# Patient Record
Sex: Female | Born: 1965 | Race: Black or African American | Hispanic: No | Marital: Single | Smoking: Current every day smoker
Health system: Southern US, Community
[De-identification: ages and names within clinical notes are randomized; demographics above are authoritative.]

## PROBLEM LIST (undated history)

## (undated) DIAGNOSIS — R740 Nonspecific elevation of levels of transaminase and lactic acid dehydrogenase [LDH]: Secondary | ICD-10-CM

## (undated) DIAGNOSIS — N9489 Other specified conditions associated with female genital organs and menstrual cycle: Secondary | ICD-10-CM

## (undated) DIAGNOSIS — F1010 Alcohol abuse, uncomplicated: Secondary | ICD-10-CM

## (undated) DIAGNOSIS — S82899A Other fracture of unspecified lower leg, initial encounter for closed fracture: Secondary | ICD-10-CM

## (undated) DIAGNOSIS — Z720 Tobacco use: Secondary | ICD-10-CM

## (undated) DIAGNOSIS — R7401 Elevation of levels of liver transaminase levels: Secondary | ICD-10-CM

## (undated) DIAGNOSIS — D649 Anemia, unspecified: Secondary | ICD-10-CM

## (undated) DIAGNOSIS — F1210 Cannabis abuse, uncomplicated: Secondary | ICD-10-CM

## (undated) DIAGNOSIS — J45909 Unspecified asthma, uncomplicated: Secondary | ICD-10-CM

## (undated) DIAGNOSIS — F329 Major depressive disorder, single episode, unspecified: Secondary | ICD-10-CM

## (undated) DIAGNOSIS — N920 Excessive and frequent menstruation with regular cycle: Secondary | ICD-10-CM

## (undated) DIAGNOSIS — F32A Depression, unspecified: Secondary | ICD-10-CM

## (undated) DIAGNOSIS — G43909 Migraine, unspecified, not intractable, without status migrainosus: Secondary | ICD-10-CM

## (undated) DIAGNOSIS — F1411 Cocaine abuse, in remission: Secondary | ICD-10-CM

## (undated) HISTORY — DX: Excessive and frequent menstruation with regular cycle: N92.0

## (undated) HISTORY — DX: Anemia, unspecified: D64.9

## (undated) HISTORY — DX: Cocaine abuse, in remission: F14.11

## (undated) HISTORY — PX: OTHER SURGICAL HISTORY: SHX169

## (undated) HISTORY — DX: Depression, unspecified: F32.A

## (undated) HISTORY — DX: Alcohol abuse, uncomplicated: F10.10

## (undated) HISTORY — DX: Elevation of levels of liver transaminase levels: R74.01

## (undated) HISTORY — DX: Other specified conditions associated with female genital organs and menstrual cycle: N94.89

## (undated) HISTORY — DX: Cannabis abuse, uncomplicated: F12.10

## (undated) HISTORY — DX: Nonspecific elevation of levels of transaminase and lactic acid dehydrogenase (ldh): R74.0

## (undated) HISTORY — DX: Major depressive disorder, single episode, unspecified: F32.9

## (undated) HISTORY — DX: Tobacco use: Z72.0

## (undated) HISTORY — DX: Unspecified asthma, uncomplicated: J45.909

## (undated) HISTORY — DX: Other fracture of unspecified lower leg, initial encounter for closed fracture: S82.899A

---

## 1997-11-28 ENCOUNTER — Inpatient Hospital Stay (HOSPITAL_COMMUNITY): Admission: AD | Admit: 1997-11-28 | Discharge: 1997-11-28 | Payer: Self-pay | Admitting: *Deleted

## 1997-11-30 ENCOUNTER — Other Ambulatory Visit: Admission: RE | Admit: 1997-11-30 | Discharge: 1997-11-30 | Payer: Self-pay | Admitting: Obstetrics

## 1997-11-30 ENCOUNTER — Encounter: Admission: RE | Admit: 1997-11-30 | Discharge: 1997-11-30 | Payer: Self-pay | Admitting: Internal Medicine

## 1998-03-03 ENCOUNTER — Inpatient Hospital Stay (HOSPITAL_COMMUNITY): Admission: AD | Admit: 1998-03-03 | Discharge: 1998-03-03 | Payer: Self-pay | Admitting: Obstetrics

## 1999-02-04 ENCOUNTER — Encounter: Payer: Self-pay | Admitting: Obstetrics & Gynecology

## 1999-02-04 ENCOUNTER — Inpatient Hospital Stay (HOSPITAL_COMMUNITY): Admission: AD | Admit: 1999-02-04 | Discharge: 1999-02-11 | Payer: Self-pay | Admitting: Obstetrics & Gynecology

## 2001-02-09 ENCOUNTER — Inpatient Hospital Stay (HOSPITAL_COMMUNITY): Admission: AD | Admit: 2001-02-09 | Discharge: 2001-02-09 | Payer: Self-pay | Admitting: *Deleted

## 2002-03-06 ENCOUNTER — Emergency Department (HOSPITAL_COMMUNITY): Admission: EM | Admit: 2002-03-06 | Discharge: 2002-03-06 | Payer: Self-pay | Admitting: Emergency Medicine

## 2002-03-09 ENCOUNTER — Emergency Department (HOSPITAL_COMMUNITY): Admission: EM | Admit: 2002-03-09 | Discharge: 2002-03-09 | Payer: Self-pay | Admitting: Emergency Medicine

## 2002-03-13 ENCOUNTER — Emergency Department (HOSPITAL_COMMUNITY): Admission: EM | Admit: 2002-03-13 | Discharge: 2002-03-13 | Payer: Self-pay

## 2002-03-20 ENCOUNTER — Emergency Department (HOSPITAL_COMMUNITY): Admission: EM | Admit: 2002-03-20 | Discharge: 2002-03-20 | Payer: Self-pay | Admitting: Emergency Medicine

## 2002-04-17 ENCOUNTER — Emergency Department (HOSPITAL_COMMUNITY): Admission: EM | Admit: 2002-04-17 | Discharge: 2002-04-18 | Payer: Self-pay | Admitting: Emergency Medicine

## 2002-09-18 ENCOUNTER — Inpatient Hospital Stay (HOSPITAL_COMMUNITY): Admission: AD | Admit: 2002-09-18 | Discharge: 2002-09-18 | Payer: Self-pay | Admitting: Family Medicine

## 2004-05-26 DIAGNOSIS — N9489 Other specified conditions associated with female genital organs and menstrual cycle: Secondary | ICD-10-CM

## 2004-05-26 DIAGNOSIS — N920 Excessive and frequent menstruation with regular cycle: Secondary | ICD-10-CM

## 2004-05-26 HISTORY — DX: Other specified conditions associated with female genital organs and menstrual cycle: N94.89

## 2004-05-26 HISTORY — DX: Excessive and frequent menstruation with regular cycle: N92.0

## 2004-06-16 ENCOUNTER — Inpatient Hospital Stay (HOSPITAL_COMMUNITY): Admission: AD | Admit: 2004-06-16 | Discharge: 2004-06-16 | Payer: Self-pay | Admitting: *Deleted

## 2004-06-18 ENCOUNTER — Ambulatory Visit: Payer: Self-pay | Admitting: Obstetrics & Gynecology

## 2004-06-18 ENCOUNTER — Encounter (INDEPENDENT_AMBULATORY_CARE_PROVIDER_SITE_OTHER): Payer: Self-pay | Admitting: *Deleted

## 2004-06-18 ENCOUNTER — Other Ambulatory Visit: Admission: RE | Admit: 2004-06-18 | Discharge: 2004-06-18 | Payer: Self-pay | Admitting: Obstetrics & Gynecology

## 2004-07-02 ENCOUNTER — Ambulatory Visit: Payer: Self-pay | Admitting: Obstetrics and Gynecology

## 2004-12-26 ENCOUNTER — Emergency Department (HOSPITAL_COMMUNITY): Admission: EM | Admit: 2004-12-26 | Discharge: 2004-12-26 | Payer: Self-pay | Admitting: Emergency Medicine

## 2005-02-08 ENCOUNTER — Emergency Department (HOSPITAL_COMMUNITY): Admission: EM | Admit: 2005-02-08 | Discharge: 2005-02-08 | Payer: Self-pay | Admitting: Emergency Medicine

## 2005-02-12 ENCOUNTER — Emergency Department (HOSPITAL_COMMUNITY): Admission: EM | Admit: 2005-02-12 | Discharge: 2005-02-12 | Payer: Self-pay | Admitting: Emergency Medicine

## 2005-11-19 ENCOUNTER — Emergency Department (HOSPITAL_COMMUNITY): Admission: EM | Admit: 2005-11-19 | Discharge: 2005-11-19 | Payer: Self-pay | Admitting: Emergency Medicine

## 2005-11-20 ENCOUNTER — Ambulatory Visit (HOSPITAL_COMMUNITY): Admission: RE | Admit: 2005-11-20 | Discharge: 2005-11-20 | Payer: Self-pay | Admitting: Orthopaedic Surgery

## 2006-12-15 ENCOUNTER — Emergency Department (HOSPITAL_COMMUNITY): Admission: EM | Admit: 2006-12-15 | Discharge: 2006-12-15 | Payer: Self-pay | Admitting: Emergency Medicine

## 2006-12-30 ENCOUNTER — Emergency Department (HOSPITAL_COMMUNITY): Admission: EM | Admit: 2006-12-30 | Discharge: 2006-12-31 | Payer: Self-pay | Admitting: Emergency Medicine

## 2007-01-02 ENCOUNTER — Inpatient Hospital Stay (HOSPITAL_COMMUNITY): Admission: AD | Admit: 2007-01-02 | Discharge: 2007-01-02 | Payer: Self-pay | Admitting: Obstetrics & Gynecology

## 2007-08-21 ENCOUNTER — Emergency Department (HOSPITAL_COMMUNITY): Admission: EM | Admit: 2007-08-21 | Discharge: 2007-08-21 | Payer: Self-pay | Admitting: Emergency Medicine

## 2007-11-29 ENCOUNTER — Other Ambulatory Visit: Payer: Self-pay | Admitting: Family Medicine

## 2007-11-30 ENCOUNTER — Inpatient Hospital Stay (HOSPITAL_COMMUNITY): Admission: EM | Admit: 2007-11-30 | Discharge: 2007-12-06 | Payer: Self-pay | Admitting: Emergency Medicine

## 2007-12-06 ENCOUNTER — Inpatient Hospital Stay (HOSPITAL_COMMUNITY): Admission: RE | Admit: 2007-12-06 | Discharge: 2007-12-10 | Payer: Self-pay | Admitting: *Deleted

## 2007-12-06 ENCOUNTER — Ambulatory Visit: Payer: Self-pay | Admitting: *Deleted

## 2007-12-23 ENCOUNTER — Emergency Department (HOSPITAL_COMMUNITY): Admission: EM | Admit: 2007-12-23 | Discharge: 2007-12-23 | Payer: Self-pay | Admitting: Emergency Medicine

## 2007-12-29 ENCOUNTER — Emergency Department (HOSPITAL_COMMUNITY): Admission: EM | Admit: 2007-12-29 | Discharge: 2007-12-30 | Payer: Self-pay | Admitting: Emergency Medicine

## 2007-12-30 ENCOUNTER — Emergency Department (HOSPITAL_COMMUNITY): Admission: EM | Admit: 2007-12-30 | Discharge: 2007-12-30 | Payer: Self-pay | Admitting: Emergency Medicine

## 2008-01-07 ENCOUNTER — Inpatient Hospital Stay (HOSPITAL_COMMUNITY): Admission: AD | Admit: 2008-01-07 | Discharge: 2008-01-07 | Payer: Self-pay | Admitting: Obstetrics & Gynecology

## 2008-01-08 ENCOUNTER — Inpatient Hospital Stay (HOSPITAL_COMMUNITY): Admission: AD | Admit: 2008-01-08 | Discharge: 2008-01-08 | Payer: Self-pay | Admitting: Family Medicine

## 2008-01-14 ENCOUNTER — Emergency Department (HOSPITAL_COMMUNITY): Admission: EM | Admit: 2008-01-14 | Discharge: 2008-01-14 | Payer: Self-pay | Admitting: Emergency Medicine

## 2008-01-19 ENCOUNTER — Encounter: Admission: RE | Admit: 2008-01-19 | Discharge: 2008-01-19 | Payer: Self-pay | Admitting: Family Medicine

## 2008-04-03 ENCOUNTER — Emergency Department (HOSPITAL_COMMUNITY): Admission: EM | Admit: 2008-04-03 | Discharge: 2008-04-03 | Payer: Self-pay | Admitting: Emergency Medicine

## 2008-11-04 ENCOUNTER — Emergency Department (HOSPITAL_COMMUNITY): Admission: EM | Admit: 2008-11-04 | Discharge: 2008-11-04 | Payer: Self-pay | Admitting: Family Medicine

## 2008-11-28 ENCOUNTER — Ambulatory Visit: Payer: Self-pay | Admitting: Internal Medicine

## 2008-11-28 ENCOUNTER — Encounter: Payer: Self-pay | Admitting: Internal Medicine

## 2008-11-28 DIAGNOSIS — J45909 Unspecified asthma, uncomplicated: Secondary | ICD-10-CM | POA: Insufficient documentation

## 2008-11-28 DIAGNOSIS — F329 Major depressive disorder, single episode, unspecified: Secondary | ICD-10-CM | POA: Insufficient documentation

## 2008-11-28 DIAGNOSIS — N83209 Unspecified ovarian cyst, unspecified side: Secondary | ICD-10-CM | POA: Insufficient documentation

## 2008-11-28 DIAGNOSIS — Z8639 Personal history of other endocrine, nutritional and metabolic disease: Secondary | ICD-10-CM

## 2008-11-28 DIAGNOSIS — Z862 Personal history of diseases of the blood and blood-forming organs and certain disorders involving the immune mechanism: Secondary | ICD-10-CM | POA: Insufficient documentation

## 2008-11-28 DIAGNOSIS — F17200 Nicotine dependence, unspecified, uncomplicated: Secondary | ICD-10-CM | POA: Insufficient documentation

## 2008-11-28 DIAGNOSIS — R32 Unspecified urinary incontinence: Secondary | ICD-10-CM | POA: Insufficient documentation

## 2008-11-28 LAB — CONVERTED CEMR LAB
Bilirubin Urine: NEGATIVE
Hgb A1c MFr Bld: 5.6 %
Specific Gravity, Urine: >=1.03

## 2009-01-05 ENCOUNTER — Ambulatory Visit: Payer: Self-pay | Admitting: Internal Medicine

## 2009-01-07 DIAGNOSIS — D72829 Elevated white blood cell count, unspecified: Secondary | ICD-10-CM | POA: Insufficient documentation

## 2009-01-07 LAB — CONVERTED CEMR LAB
Basophils Relative: 0 % (ref 0–1)
Bilirubin Urine: NEGATIVE
Eosinophils Absolute: 0.2 10*3/uL (ref 0.0–0.7)
HCT: 37.6 % (ref 36.0–46.0)
Hemoglobin: 12.2 g/dL (ref 12.0–15.0)
MCHC: 32.4 g/dL (ref 30.0–36.0)
MCV: 87.9 fL (ref >=78.0)
Neutrophils Relative %: 63 % (ref 43–77)
Nitrite: NEGATIVE
Platelets: 311 10*3/uL (ref 150–400)
Protein, ur: NEGATIVE mg/dL
Urine Glucose: NEGATIVE mg/dL
WBC, UA: NONE SEEN cells/hpf (ref <3)

## 2009-01-08 ENCOUNTER — Encounter: Payer: Self-pay | Admitting: Internal Medicine

## 2009-01-10 ENCOUNTER — Ambulatory Visit: Payer: Self-pay | Admitting: Internal Medicine

## 2009-01-10 LAB — CONVERTED CEMR LAB: Sed Rate: 5 mm/hr (ref 0–22)

## 2009-01-19 ENCOUNTER — Ambulatory Visit (HOSPITAL_COMMUNITY): Admission: RE | Admit: 2009-01-19 | Discharge: 2009-01-19 | Payer: Self-pay | Admitting: Internal Medicine

## 2009-03-14 ENCOUNTER — Telehealth: Payer: Self-pay | Admitting: *Deleted

## 2009-07-25 ENCOUNTER — Telehealth: Payer: Self-pay | Admitting: Internal Medicine

## 2010-01-30 ENCOUNTER — Ambulatory Visit (HOSPITAL_COMMUNITY): Admission: RE | Admit: 2010-01-30 | Discharge: 2010-01-30 | Payer: Self-pay | Admitting: Internal Medicine

## 2010-06-23 LAB — CONVERTED CEMR LAB
AST: 15 units/L (ref 0–37)
Albumin: 4.1 g/dL (ref 3.5–5.2)
Alkaline Phosphatase: 92 units/L (ref 39–117)
Basophils Absolute: 0 10*3/uL (ref 0.0–0.1)
Basophils Relative: 0 % (ref 0–1)
Bilirubin Urine: NEGATIVE
Chloride: 108 meq/L (ref 96–112)
Cholesterol: 154 mg/dL (ref 0–200)
Eosinophils Relative: 1 % (ref 0–5)
HCV Ab: NEGATIVE
HDL: 51 mg/dL (ref >39)
Hemoglobin: 12.4 g/dL (ref 12.0–15.0)
Ketones, ur: NEGATIVE mg/dL
MCHC: 32.1 g/dL (ref 30.0–36.0)
Neutro Abs: 9 10*3/uL — ABNORMAL HIGH (ref 1.7–7.7)
Neutrophils Relative %: 65 % (ref 43–77)
Nitrite: NEGATIVE
Platelets: 239 10*3/uL (ref 150–400)
RBC: 4.4 M/uL (ref 3.87–5.11)
RDW: 17.6 % — ABNORMAL HIGH (ref 11.5–15.5)
Specific Gravity, Urine: 1.03 (ref 1.005–1.030)
Triglycerides: 137 mg/dL (ref <150)

## 2010-06-25 NOTE — Progress Notes (Signed)
Summary: refill/ hla  Phone Note Refill Request Message from:  Fax from Pharmacy on July 25, 2009 10:48 AM  Refills Requested: Medication #1:  VENTOLIN HFA 108 (90 BASE) MCG/ACT AERS 2 puffs every 4 -6 hours as needed for shortness of breath.   Dosage confirmed as above?Dosage Confirmed   Last Refilled: 12/21 last seen aug/ 2010  Initial call taken by: Marin Roberts RN,  July 25, 2009 10:49 AM  Follow-up for Phone Call       Follow-up by: [name] MD,  July 26, 2009 2:21 AM    Prescriptions: VENTOLIN HFA 108 (90 BASE) MCG/ACT AERS (ALBUTEROL SULFATE) 2 puffs every 4 -6 hours as needed for shortness of breath  #1 x 6   Entered and Authorized by:   [name] MD   Signed by:   [name] MD on 07/26/2009   Method used:   Faxed to ...       [facility] (retail)       [address] [place], [place]  [address]       Ph: [phone]       Fax: [phone]   RxID:   [phone] [phone]

## 2010-08-04 ENCOUNTER — Encounter: Payer: Self-pay | Admitting: Ophthalmology

## 2010-09-02 LAB — POCT URINALYSIS DIP (DEVICE)
Glucose, UA: NEGATIVE mg/dL
Hgb urine dipstick: NEGATIVE
Nitrite: NEGATIVE
Urobilinogen, UA: 1 mg/dL (ref 0.0–1.0)

## 2010-10-08 NOTE — Consult Note (Signed)
[name], [name]                 ACCOUNT NO.:  [id number]   MEDICAL RECORD NO.:  [id number]          PATIENT TYPE:  INP   LOCATION:  1501                         FACILITY:  [facility]   PHYSICIAN:  [name], M.D. DATE OF BIRTH:  1966/04/30   DATE OF CONSULTATION:  12/05/2007  DATE OF DISCHARGE:  12/06/2007                                 CONSULTATION   HISTORY OF PRESENT ILLNESS:  I was call to see [name] who has already  been scheduled for hospitalization at [facility], apparently they needed a more recent evaluation by a  psychiatrist before she could be transferred.  She was already seen by  Dr. Horton Marshall (please see his full note).  The patient is a 45 year old  African American female who is admitted to the hospital after taking an  overdose of Motrin 50 tablets with suicidal attempt.  She states that  her sleep is still poor, her appetite is poor.  Her partner, [name], was  with [place] as we talked and gave a lot of information too.  She states she  has been several years depressed and anxious, she has never been treated  at all and she has a serious alcohol abuse problem, marijuana and  cocaine, and other drugs except heroin.  She states she was living in a  bad environment and it was impossible to stop her use.  She continues to  have positive suicidal ideation if she goes home, she does not feel she  would be safe.  She did state she called her partner after the attempt,  their number stretches including partner being pregnant and finances.   MENTAL STATUS EXAM:  The patient was a friendly, cooperative, thin  female who was lying in bed.  Speech was soft and slow.  Psychomotor  activity revealed retardation.  Eye contact was good.  Mood was  depressed.  Anxious affect consistent with mood.  Suicidal ideation was  positive if she goes home and notes no homicidal ideation.  No auditory  or visual hallucinations.  There is no paranoid delusions.   Thoughts  were logical and goal directed.  Thought content, not predominant.  The  cognitive was grossly intact.   ASSESSMENT:  Mood disorder, not otherwise specified and polysubstance  dependence.  The patient is still suicidal, if she goes home.   RECOMMENDATIONS:  Transfer to [facility] as  soon as patient is medically stable.  She agrees to this transfer.      [name], M.D.  Electronically Signed     BHS/MEDQ  D:  12/05/2007  T:  12/05/2007  Job:  [id number]

## 2010-10-08 NOTE — H&P (Signed)
NAMEKYLIANA, STANDEN                 ACCOUNT NO.:  [id number]   MEDICAL RECORD NO.:  [id number]          PATIENT TYPE:  INP   LOCATION:  0101                         FACILITY:  [facility]   PHYSICIAN:  Lucita Ferrara, MD         DATE OF BIRTH:  12-May-1966   DATE OF ADMISSION:  11/30/2007  DATE OF DISCHARGE:                              HISTORY & PHYSICAL   The patient is a 45 year old African American female presents to [facility] transferred here from [facility].  The patient  presented there with a chief complaint of abdominal pain located  bilateral lower abdomen with no radiation that apparently has been going  on now for the last 3 days.  The patient states that she took 50 tablets  of ibuprofen in the last 3 days.  Note, she does  not know if she took  acetaminophen or ibuprofen.  Her pain is been associated with fevers,  chills, nausea, and vomiting.  Vomitus is nonbilious.  She denies any  diarrhea, dysuria or vaginal discharge.  Also her history is significant  for polysubstance abuse with cocaine and marijuana which she has used in  the last 2 or 3 days.  She denies any hematemesis, hematochezia.  She  denies any coffee-ground emesis.  She denies any recent sick contacts.  No recent travel.  She also admits to drinking unknown amount of  alcohol.   PAST MEDICAL HISTORY:  Is significant for:  1. History of polysubstance abuse.  2. Reports remote history of clinical vaginal surgery.  3. History of asthma.   FAMILY HISTORY:  Unknown, noncontributory at this point.   SOCIAL HISTORY:  She smokes.  She is a heavy drinker.  She also abuses  crack cocaine and cannabis which she has used in the last 24-48 hours.   ALLERGIES:  No known drug allergies.   MEDICATIONS:  Include:  1. Ibuprofen.  2. Albuterol.  3. Unknown whether she has taken Tylenol.   PHYSICAL EXAMINATION:  Generally speaking, the patient is in no acute  distress.  She is obviously intoxicated.  Temperature is T max is 101.1.  HEENT:  Normocephalic, atraumatic.  Sclerae anicteric.  NECK:  Supple, no JVD or carotid bruits.  PERRLA.  Extraocular muscles intact.  CARDIOVASCULAR:  S1, S2 regular rhythm, no murmurs, rubs or clicks.  ABDOMEN:  Soft, nontender, nondistended.  Positive bowel sounds.  LUNGS:  Clear to auscultation bilaterally.  No rhonchi, rales or wheeze.  LOWER EXTREMITIES:  No clubbing, cyanosis or edema.  NEURO:  Patient is intoxicated, yet alert and oriented x3.  Cranial  nerves II through XII are grossly intact.   LABORATORY DATA:  Lipase 24, blood alcohol level less than 5,  acetaminophen level less than 10.  Urine drug screen positive for  benzodiazepines, positive for cocaine.  Complete metabolic panel within  normal limits with the exception of a low potassium of 3.2. Alk phos  138, AST 6713, ALT 5230.  Urinalysis, high specific gravity of 1.03.  Urine pregnancy test negative.  Radiological data:  Chest x-ray shows  no  acute cardiopulmonary events.   ASSESSMENT/PLAN:  A 45 year old with:  1. Abdominal pain for which she has ingested 50 ibuprofens per her who      comes here with severely high AST, ALT, acute hepatitis.  Rule out      hepatitis A,B,C.  We will send off hepatitis A, B, C.  Tylenol      toxicity ruled out in the emergency room, unlikely  pancreatitis.      We will proceed with an abdominal ultrasound and a CT scan of the      abdomen and pelvis with and without contrast  2. Polysubstance abuse with alcohol, cocaine and THC.  Again, unclear      when the last alcohol ingestion was as patient is intoxicated now.      The patient is likely to go into withdrawal; however, alcohol      withdrawal protocol is advised, although, given her acute hepatitis      I do not want to exacerbate that with any benzodiazepines at this      point.  Again, we will go ahead and watch for any signs of      withdrawal.  Ativan only p.r.n.  3. Hypokalemia.   Replete.  4. Parasuicide.  It is unclear whether the patient took ibuprofen for      abdominal pain or whether she was trying to hurt herself.  There      was one expression here in the emergency room that she was trying      to hurt herself, although she is intoxicated.  Regardless, we will      go ahead and proceed with one-on-one observation.  Will consider an      ACT team- evaluation prior to any psychiatric evaluation.   PLAN:  Hepatitis panel, (A, B, C) note tylenol toxicity ruled out.  CT  scan of abdomen and pelvis, abdominal ultrasound to rule-out  intrahepatic dysfunction.  detox protocol.  Replete potassium.  Protonix  b.i.d.  Note:  It is worrisome if she took ibuprofen.  We need to watch  her hemoglobin to make sure she does not have any peptic ulcer disease.  The rest of the plans are dependent on her progress and hospital course.      Lucita Ferrara, MD  Electronically Signed     RR/MEDQ  D:  11/30/2007  T:  11/30/2007  Job:  [id number]

## 2010-10-08 NOTE — Discharge Summary (Signed)
[name], [name]                 ACCOUNT NO.:  [id number]   MEDICAL RECORD NO.:  [id number]          PATIENT TYPE:  IPS   LOCATION:  0603                          [facility]  [facility]   PHYSICIAN:  Jasmine Pang, M.D. DATE OF BIRTH:  1966-02-25   DATE OF ADMISSION:  12/06/2007  DATE OF DISCHARGE:  12/10/2007                               DISCHARGE SUMMARY   IDENTIFICATION:  This is a 45 year old single African American female  who was admitted on a voluntary basis on December 06, 2007.   HISTORY OF PRESENT ILLNESS:  The patient presents with a history of  intentional overdose on ibuprofen taking about 50 tablets.  The patient  was initially admitted to the regular hospital and had approximately a  week stay until medically stable for transfer to our facility.  Her  intention with the overdose was as a suicide attempt.  She reports being  depressed for years, but her depression has been increasing over the  past few weeks.  She has been using alcohol and cocaine regularly,  drinking a lot.  Her sleep has been decreased.  She states that she  smokes crack cocaine all the time.  She is currently looking for a long-  term rehab.  She did have a history of sobriety that lasted 2 years.  She denies any hallucinations at this time.   PAST PSYCHIATRIC HISTORY:  This is the patient's first admission to  [facility] and no other psychiatric hospitalizations  known.  No current outpatient mental health treatment.   FAMILY HISTORY:  Noncontributory.   ALCOHOL AND DRUG HISTORY:  As above.  Denies any IV drug use.  Denies  any seizures or blackouts.  Last drink was over a week ago.   MEDICAL PROBLEMS:  Asthma.   MEDICATIONS:  The patient takes an occasional albuterol inhaler.  No  other medications listed.   DRUG ALLERGIES:  No known drug allergies.   PHYSICAL FINDINGS:  The patient had no acute physical or medical  problems noted.  Her physical exam was done during the inpatient  stay on  the medical unit.   ADMISSION LABORATORIES:  Urine drug screen was positive for cocaine,  positive for THC.  CBC was within normal limits.  Urinalysis was  negative.  Hepatitis panel was negative, but liver enzymes were quite  elevated on admission with an AST elevated at 6713 and an ALT of 5230.  Liver enzymes continued to decline with her stay.  On December 04, 2007, her  ALT is 572 with an AST of 64.  Her alcohol level was less than 5 upon  admission.   HOSPITAL COURSE:  Upon admission, the patient was started on trazodone  50 mg p.o. q.h.s., and Septra DS one p.o. b.i.d. x3 days for UTI.  This  was recommended by the internal medicine physician.  In individual  sessions with me, the patient was reserved, but cooperative with fair  eye contact.  She continued to state she wanted long-term treatment.  On  December 07, 2007, she was started on Symmetrel 100 mg b.i.d. and  Celexa 20  mg q.a.m.  The patient did not participate appropriately in unit  therapeutic groups and activities and spent most of her time in bed.  On  December 08, 2007, trazodone was increased to 100 mg q.h.s. may repeat x1.  The patient focused on having some GI discomfort and continued to stay  in bed.  She also remained depressed and anxious.  On December 09, 2007, she  complained of yeast infection.  She was started on Diflucan 150 mg q.  day.  She was starting to become less depressed and less anxious.  She  had a lot of support from her family and her partner.  There was a  family session with her mother and partner and the patient felt good  about their involvement.  On December 10, 2007, mental status had improved  markedly from admission status.  The patient's mood was less depressed,  less anxious.  Affect consistent with mood.  There was no suicidal or  homicidal ideation.  No thoughts of self-injurious behavior.  No  auditory or visual hallucinations.  No paranoia or delusions.  Thoughts  were logical and  goal-directed.  Thought content, no predominant theme.  Cognitive was grossly intact.  It was felt the patient was safe for  discharge today.   DISCHARGE DIAGNOSES:  Axis I:  Alcohol dependence.  Polysubstance abuse.  Depressive disorder, not otherwise specified.  Axis II:  None.  Axis III:  1.  Acute transaminitis.  1. Normocytic anemia.  2. Menorrhagia.  3. Urinary tract infection.  Axis IV:  Moderate (other psychosocial problems related to chronic  substance use).  Axis V:  Global assessment of functioning was 53 upon discharge.  GAF  was 40-45 upon admission.  GAF highest past year was 60.   DISCHARGE PLANS:  There was no specific activity level or dietary  restriction.   POSTHOSPITAL CARE PLANS:  The patient will be [facility] at [facility]  on Monday December 13, 2007, at 11:30 a.m.  She will also be seen at the  [facility] on Tuesday December 24, 2007, at 8 o'clock.   DISCHARGE MEDICATIONS:  1. Amantadine 100 mg twice daily.  2. Celexa 20 mg daily.  3. Trazodone 100 mg at bedtime if needed.      Jasmine Pang, M.D.  Electronically Signed     BHS/MEDQ  D:  12/11/2007  T:  12/11/2007  Job:  [id number]

## 2010-10-08 NOTE — Discharge Summary (Signed)
NAMEGWENEVERE, [name]                 ACCOUNT NO.:  [id number]   MEDICAL RECORD NO.:  [id number]          PATIENT TYPE:  INP   LOCATION:  1518                         FACILITY:  [facility]   PHYSICIAN:  Lonia Blood, M.D.DATE OF BIRTH:  [birth year]   DATE OF ADMISSION:  11/30/2007  DATE OF DISCHARGE:  12/04/2007                               DISCHARGE SUMMARY   PRIMARY CARE PHYSICIAN:  Unassigned.   DISCHARGE DIAGNOSES:  1. Acute transaminitis.      a.     Secondary to alcohol abuse plus ibuprofen abuse.      b.     Resolving without further acute intervention.      c.     No evidence of Tylenol overdose.  2. Multifactorial normocytic anemia.      a.     Alcohol abuse.      b.     Poor nutrition.      c.     Menorrhagia.  3. Suicidal ideation - for transfer to psychiatric facility.  4. Polysubstance abuse.      a.     Tobacco.      b.     Alcohol.      c.     Crack cocaine.      d.     Marijuana  5. Urinary tract infection - antibiotic therapy initiated.  6. Adnexal cystic masses.      a.     First noted in 2006.      b.     For outpatient observation.      c.     The patient states that they have been fully evaluated and       that she has been told there is no indication for excision.   DISCHARGE MEDICATIONS:  Septra DS, 1 p.o. b.i.d. x7 days then stop.   CONSULTATIONS:  Inpatient psychiatry.   PROCEDURE:  1. Ultrasound of the abdomen November 30, 2007 - unremarkable abdominal      ultrasound.  2. CT scan of the abdomen and pelvis November 30, 2007 - no acute findings      in the abdomen.  No imaging findings to suggest cirrhosis.  Chronic      cystic bilateral adnexal masses, right larger than left.  Given the      similarities to those documented in 2006, ovarian torsion is      unlikely. These could reflect endometriomas, recurrent hemorrhagic      cysts, or benign ovarian neoplasms.  Follow-up ultrasound is      recommended in 6-10 weeks.  No acute findings in her pelvis.   DISCHARGE LABORATORY DATA:  At the time of this dictation, AST is 130  and ALT is 957, which were both significantly improved from the day  prior, at which time AST was 235 and ALT was 1296.  Hemoglobin is 9.9 at  discharge which is felt to be reflective of the patient's baseline.   HOSPITAL COURSE:  [name] is a 45 year old female, who presented  to the hospital on November 30, 2007 with complaints of  abdominal pain.  During her stay in the emergency room, she voiced a desire to commit  suicide and alluded to the idea that she had perhaps done this prior to  her admission.  With further prompting she reported that she had taken a  total of 50 ibuprofen, some time over the last 72 hours.  She also  admitted to abuse of cocaine, marijuana and alcohol.  She was admitted  to the acute unit after she was found to be suffering with severe  elevation of LFTs.  AST was 6713 with an ALT of 5230 at the time of her  admission.  Acetaminophen level was obtained and was found to be  unremarkable.  The likely cause was felt to be alcohol abuse, as well as  ibuprofen which can infrequently cause hepatitis.  A viral hepatitis was  obtained and was unrevealing.  The patient was hydrated.  Her LFTs were  followed closely.  With ongoing hydration and avoidance of hepatotoxic  medications, the patient's LFTs improved very nicely.  At the time of  this dictation, her AST is 130 and ALT is 957.  She has no jaundice or  scleral icterus and there is no difficulty with oral intake.   During the hospital stay, significant normocytic anemia was also  appreciated.  An iron panel was obtained which revealed a low iron, a  low percent sat and a normal total iron binding capacity.  B12 folate  levels were normal.  The patient did report having monthly 7-day periods  which were marked by heavy flow.  Additionally she admits to chronic  alcohol abuse.  The patient's anemia is felt to be a chronic  multifactorial  anemia, due to the direct toxic effects of alcohol on the  bone marrow, as well as increased loss from menorrhagia.  At the present  time, no further evaluation is felt to be indicated.  The patient has  been counseled extensively as to the need to discontinue her  polysubstance abuse and has been advised to obtain a GYN for close  followup in the outpatient setting.   During this hospital stay, urinalysis was worrisome for a probable  urinary tract infection.  The patient had a low grade leukocytosis.  Septra was initiated during the hospital stay and the patient tolerated  this without difficulty.  She will complete a full 7-day course at the  time of her discharge.   Given the patient's mention of probable suicidal ideation, inpatient  psychiatry was consulted.  They did, in fact, feel that the patient was  at risk for harming herself should she be discharged home.  They  recommended that the patient be transferred directly to a psychiatric  ward.  At the present time, the patient has been medically cleared.  We  are awaiting bed availability in the psychiatric facility, so that the  patient can be discharged for ongoing care.   As part of evaluation for the patient's abdominal pain, the CT scan of  the abdomen and pelvis was carried out.  This did reveal bilateral  cystic adnexal masses.  They were noted to be present on previous  imaging studies dating back to 2006  92,006.  These findings were  discussed with the patient.  She reports that she was followed closely  with GYN years ago for this problem.  She was advised that these were  harmless cysts and that they did not need to be removed until they  started causing problems for  her.  They do, in fact, appear to be  stable in size.  It is recommended that reevaluation be accomplished in  6-10 weeks for routine following, and the patient is advised to revisit  her GYN at such time that she released from the psychiatric  facility.   On December 04, 2007, [name] was cleared for discharge from a medical  standpoint.  At such time that a psychiatric facility that can accept  her is identified, the patient will be transferred directly there.      Lonia Blood, M.D.  Electronically Signed     JTM/MEDQ  D:  12/04/2007  T:  12/04/2007  Job:  [id number]

## 2010-10-08 NOTE — Discharge Summary (Signed)
NAMEDANILYN, COCKE                 ACCOUNT NO.:  [id number]   MEDICAL RECORD NO.:  [id number]          PATIENT TYPE:  INP   LOCATION:  1501                         FACILITY:  [facility]   PHYSICIAN:  Lonia Blood, M.D.      DATE OF BIRTH:  12-Dec-1965   DATE OF ADMISSION:  11/30/2007  DATE OF DISCHARGE:  12/06/2007                               DISCHARGE SUMMARY   Please, for full discharge diagnosis, see the discharge summary see  discharge summary dictated by Dr. Jetty Duhamel on December 04, 2007.  Between July, 2009 and December 06, 2007 the patient was awaiting transfer  to [facility].  She has done well and psychiatric has seen  patient.  She became depressed and hopeless on December 05, 2007.  Based on  their recommendation, the patient was transferred to the [facility] on December 06, 2007.  Otherwise, the rest of  discharge summary is unchanged from December 04, 2007.      Lonia Blood, M.D.  Electronically Signed     LG/MEDQ  D:  01/02/2008  T:  01/02/2008  Job:  [id number]

## 2010-10-08 NOTE — H&P (Signed)
[name], [name]                 ACCOUNT NO.:  [id number]   MEDICAL RECORD NO.:  [id number]          PATIENT TYPE:  IPS   LOCATION:  0603                          FACILITY:  [facility]   PHYSICIAN:  [name], M.D. DATE OF BIRTH:  [birth year]   DATE OF ADMISSION:  12/06/2007  DATE OF DISCHARGE:                       PSYCHIATRIC ADMISSION ASSESSMENT   This is a 45 year old female voluntarily admitted on December 06, 2007.   HISTORY OF PRESENT ILLNESS:  The patient presents with a history of  intentional overdose on ibuprofen, taking about 50 tablets.  The patient  was initially admitted to the hospital and had approximately a week's  stay until medically stable for transfer to our facility.  Her intention  with the overdose was as a suicide attempt.  She reports being depressed  for years but her depression has been increasing over the past few  weeks.  She has been using alcohol and cocaine regularly, drinking a  lot.  Her sleep has been decreased.  She states that she smokes crack  all the time.  She is currently living for long-term rehab.  She did  have a history of sobriety that lasted 2 years.  Denies any  hallucinations at this time.   PAST PSYCHIATRIC HISTORY:  This is the patient's first admission to  [facility].  No other psychiatric hospitalizations known.  No current outpatient mental health treatment.   SOCIAL HISTORY:  This is a 45 year old female who has a supportive  partner who is currently 4 months pregnant.  The patient lives in an  extended family situation.  Is unemployed.   FAMILY HISTORY:  Noncontributory.   ALCOHOL AND DRUG HISTORY:  As above.  Denies any IV drug use.  Denies  any seizures or blackouts.  Last drink was over a week ago.   MEDICAL PROBLEMS:  Asthma.   MEDICATIONS:  The patient takes an occasional albuterol inhaler.  No  other medications listed.   DRUG ALLERGIES:  No known allergies.   PHYSICAL EXAM:  This is a thin, somewhat  disheveled female.  She appears  tired but she offers no specific complaints at this time.  Her temperature is 97.7, 66 heart rate, 20 respirations, blood pressure  is 135/76.  She is 5 feet 7 inches tall and 65 kg.   Her laboratory listed as a urine drug screen positive for cocaine,  positive for THC.  CBC within normal limits.  Urinalysis is negative.  Hepatitis panel was negative.  Her liver enzymes were quite elevated on  admission with an AST elevated at 6713 and an ALT of 5230.  Liver  enzymes continued to decline over her stay and on July 11, her ALT is  572 with an AST of 64.  Her alcohol level was less than 5 on admission.   MENTAL STATUS EXAM:  This is a fully alert female, cooperative, poor eye  contact.  Again, somewhat disheveled.  She is appropriately dressed.  Speech is soft-spoken.  Offers little information but does answer  questions.  Her mood is hopeless.  Her affect is depressed.  Thought  processes are coherent.  No evidence of any delusional thinking.  Denies  any current suicidal thoughts.  Cognitive function intact.  Memory  appears to be good.  Judgment and insight appear to be fair to good.  Poor impulse control related to substance use.   AXIS I:  1.  Alcohol dependence.  2.  Polysubstance abuse.  3.  Depressive disorder, not otherwise specified.  AXIS II:  Deferred.  AXIS III:  1.  Acute transaminitis.  2.  Normocytic anemia.  3.  Menorrhagia.3.  Urinary tract infection.  AXIS IV:  Other psychosocial problems related to chronic substance use.  AXIS V:  Current is 40-45.   PLAN:  Contract for safety.  We will stabilize mood and thinking.  Will  continue to work on the patient's relapse prevention.  We will have  Symmetrel available for cocaine cravings.  Encourage fluids.  The  patient will be in the red group.  We will also continue to assess her  comorbidities.  Case manager will assess any long-term rehab programs.  Tentative length of stay is 3-5  days.      Landry Corporal, N.P.      [name], M.D.  Electronically Signed    JO/MEDQ  D:  12/07/2007  T:  12/07/2007  Job:  [id number]

## 2010-10-08 NOTE — Consult Note (Signed)
NAMEJEFFREY, VOTH                 ACCOUNT NO.:  [id number]   MEDICAL RECORD NO.:  [id number]          PATIENT TYPE:  INP   LOCATION:  1518                         FACILITY:  [facility]   PHYSICIAN:  Antonietta Breach, M.D.  DATE OF BIRTH:  08/05/1965   DATE OF CONSULTATION:  12/01/2007  DATE OF DISCHARGE:                                 CONSULTATION   REQUESTING PHYSICIAN:  [facility]   REASON FOR CONSULTATION:  Overdose.   HISTORY OF PRESENT ILLNESS:  Ms. [name] is a 45 year old female  admitted to the [facility] on November 30, 2007 due to hepatitis  and an overdose.   The patient acknowledges that she was trying to harm herself when she  took 50 tablets of Motrin she states that she relapsed on her cocaine  and felt severe depression and suicidal thoughts.  She took the Motrin  in a suicide attempt.   She does continue with suicidal thoughts. However, she is able to push  them now and they are fleeting. She is able to contract for safety.   She is not having any hallucinations or delusions.  She has no racing  thoughts.  Her orientation and memory are intact.  She is cooperative.   PAST PSYCHIATRIC HISTORY:  The the patient does have a history of heavy  alcohol use in the past.  She has had multiple relapses on cocaine.  The  longest that she has been abstinent from cocaine has been 6 months.   FAMILY PSYCHIATRIC HISTORY:  None.   SOCIAL HISTORY:  The patient is single.  Religion [employer].   PAST MEDICAL HISTORY:  Hepatitis, asthma.   The patient does have elevated SGOT at 49 on SGPT is 2743. Her Tylenol  lipase and aspirin are negative.  Urine drug screen positive for  benzodiazepines, cocaine and THC.  Urine HCG negative.  Mono test  negative.   The patient has NO KNOWN DRUG ALLERGIES.   Afebrile.  VITAL SIGNS:  Stable.   MENTAL STATUS EXAM:  [name] is alert and oriented to all spheres.  Affect is constricted with tears.  Mood is depressed.  Thought  content  as in the history of present illness.  Thought process logical,  coherent, goal-directed.  No looseness of associations.  Insight is  intact. Judgment is intact for the need of treatment.  Memory is intact.   ASSESSMENT:  AXIS I:  1.)  293.83 Mood disorder not otherwise specified, depressed.  2.)  Polysubstance dependence.  AXIS II:  Deferred.  AXIS III:  See above.  AXIS IV:  Primary support group.  AXIS V:  35.   [name] is at risk to harm herself outside of the supportive  environment of the hospital. She does contract for safety on the ward.  She agrees to call the nursing station if she has any concern that she  is going to act on suicidal thoughts.   RECOMMENDATIONS:  1. The patient would be at risk outside of the hospital; therefore,      recommend that the patient be  admitted to an inpatient psychiatric      ward for further evaluation and treatment of depression and      substance dependence.  2. Would check an INR.  3. Psychotropic medication deferred.      Antonietta Breach, M.D.  Electronically Signed     JW/MEDQ  D:  12/01/2007  T:  12/01/2007  Job:  [id number]

## 2010-10-11 NOTE — Group Therapy Note (Signed)
[name], [name]                 ACCOUNT NO.:  [id number]   MEDICAL RECORD NO.:  [id number]          PATIENT TYPE:  WOC   LOCATION:  [facility]                   FACILITY:  [facility]   PHYSICIAN:  Elsie Lincoln, MD      DATE OF BIRTH:  [birth year]   DATE OF SERVICE:  06/18/2004                                    CLINIC NOTE   CHIEF COMPLAINT:  Vaginal bleeding.   SUBJECTIVE:  Ms. Minchew is a new patient to our clinic.  Briefly, she is a G1  P0-0-1-0 with one termination who is 45 years old and has been having  menometrorrhagia for this last month.  She states in the past month she has  bled three times, approximately 3-4 days in duration, with very heavy  bleeding. This last bleeding has been going on for 3 days.  She does state  she had a normal menses at the end of December.  With these bleeds she has a  lot of cramping and nausea.  She does state she is lightheaded and dizzy.  She is not on any type of contraception but uses condoms when she is  sexually active.  She denies any history of endometriosis and has never had  abnormal bleeding in the past.   PAST MEDICAL HISTORY:  Negative.   PAST SURGICAL HISTORY:  Negative.   FAMILY HISTORY:  Negative.   SOCIAL HISTORY:  She does smoke tobacco.  She also admits to cocaine and a  history of IV drug use in the past.  She drinks alcohol and she does not  work.   REVIEW OF SYSTEMS:  Per history form.   OBJECTIVE:  VITAL SIGNS:  Noted.  GENERAL:  She is a disheveled-appearing female in no acute distress.  PELVIC:  Reveals normal external female genitalia.  There is blood on the  perineum and in the vagina.  Vaginal walls appear normal.  Cervix appears  nulliparous with no lesions.  The uterus was sounded to 8 cm.   PROCEDURE:  Endometrial biopsy was performed.  The patient was placed in  lithotomy position and a speculum was inserted.  The cervix was prepped in a  sterile manner.  Tenaculum was not used due to the fact that the  biopsy  pipette was easily inserted through the cervix to 8 cm.  One sample was  taken and the patient had too much discomfort to have that repeated.  She  does understand that she will have to have it repeated if the specimen is  not adequate.   ASSESSMENT AND PLAN:  1.  Menometrorrhagia.  I went ahead and did the endometrial biopsy.  She      will return in 2 weeks for follow-up of those results.  I did discuss      with her options for cycling and since she smokes tobacco and is over 35      I cannot give her any estrogen-containing products.  Therefore, she has      agreed to a Depo-Provera injection and will be receiving one of those      today.  I have given her ibuprofen 800 t.i.d. #90 no refills.  Her      ultrasound does show a right adnexal mass that is 7 x 5 x 6 cm this is a      possible endometrioma.  The left ovary shows a 2.5 x 5 x 4 cm mass which      may be a hemorrhagic cyst.  Again, she denies any history of      endometriosis.  2.  History of cocaine use.  I went ahead and took a drug screen today.      There is some concern for drug-seeking behavior.  3.  Ovarian masses.  Will go ahead and do a repeat ultrasound in 6 weeks for      follow-up.   She is to follow up in 2 weeks.      LC/MEDQ  D:  06/18/2004  T:  06/18/2004  Job:  [id number]

## 2010-10-11 NOTE — Op Note (Signed)
NAMEEWELINA, NAVES                 ACCOUNT NO.:  [id number]   MEDICAL RECORD NO.:  [id number]          PATIENT TYPE:  AMB   LOCATION:  SDS                          FACILITY:  [facility]   PHYSICIAN:  Sharolyn Douglas, M.D.        DATE OF BIRTH:  1966-04-20   DATE OF PROCEDURE:  11/20/2005  DATE OF DISCHARGE:  11/20/2005                                 OPERATIVE REPORT   PREOPERATIVE DIAGNOSIS:  Left bimalleolar equivalent ankle fracture.   POSTOPERATIVE DIAGNOSIS:  Left bimalleolar equivalent ankle fracture.   PROCEDURE:  Closed reduction of left ankle fracture under fluoroscopy and  placement of short-leg cast.   SURGEON:  Sharolyn Douglas, M.D.   ASSISTANT:  Colleen Mahar, P.A.-C.   ANESTHESIA:  General endotracheal.   COMPLICATIONS:  None.   INDICATIONS:  The patient is a 45 year old female who fell several days ago.  She presented to [facility] yesterday and had x-rays taken which  showed a lateral malleolus fracture with displacement of the medial mortise.  She now presents for closed reduction and casting under anesthesia with  possible ORIF.  I discussed both options with her and she understands the  risks and benefits.  She elected to proceed.   PROCEDURE:  She was identified in the holding area, taken to the operating  room, underwent general endotracheal anesthesia without difficulty, given  prophylactic IV antibiotics.  The fluoroscopy was used to evaluate the  ankle.  Using gentle manipulation, the lateral malleolus fracture was  reduced.  Repeat imaging showed near anatomic alignment of the mortise.  We,  therefore, elected to proceed with placement of a short-leg cast.  A well  padded and well molded cast was then placed using Webril and fiberglass.  Repeat imaging again demonstrated excellent reduction of the ankle mortise.  The patient was then extubated without difficulty and transferred to the  recovery room in stable condition.  She will remain non-weight bearing  with  crutches.  She will follow up in the office in one week for a repeat  radiograph.      Sharolyn Douglas, M.D.  Electronically Signed     MC/MEDQ  D:  11/20/2005  T:  11/20/2005  Job:  [id number]

## 2011-02-17 LAB — RAPID URINE DRUG SCREEN, HOSP PERFORMED
Cocaine: POSITIVE — AB
Tetrahydrocannabinol: POSITIVE — AB

## 2011-02-17 LAB — URINALYSIS, ROUTINE W REFLEX MICROSCOPIC
Bilirubin Urine: NEGATIVE
Nitrite: NEGATIVE
Specific Gravity, Urine: 1.027
Urobilinogen, UA: 0.2
pH: 5.5

## 2011-02-17 LAB — POCT CARDIAC MARKERS
CKMB, poc: <1 — ABNORMAL LOW
Operator id: 1192
Troponin i, poc: <0.05

## 2011-02-17 LAB — URINE MICROSCOPIC-ADD ON

## 2011-02-20 LAB — H1N1 SCREEN (PCR): H1N1 Virus Scrn: NOT DETECTED

## 2011-02-20 LAB — URINALYSIS, ROUTINE W REFLEX MICROSCOPIC
Glucose, UA: NEGATIVE
Glucose, UA: NEGATIVE
Ketones, ur: 15 — AB
Protein, ur: 30 — AB
Urobilinogen, UA: 1
pH: 6

## 2011-02-20 LAB — RETICULOCYTES
RBC.: 3.58 — ABNORMAL LOW
Retic Count, Absolute: 25.1

## 2011-02-20 LAB — SALICYLATE LEVEL: Salicylate Lvl: <4

## 2011-02-20 LAB — COMPREHENSIVE METABOLIC PANEL
ALT: 1802 — ABNORMAL HIGH
ALT: 957 — ABNORMAL HIGH
AST: 130 — ABNORMAL HIGH
AST: 1409 — ABNORMAL HIGH
AST: 64 — ABNORMAL HIGH
AST: 6713 — ABNORMAL HIGH
Albumin: 2.4 — ABNORMAL LOW
Albumin: 2.4 — ABNORMAL LOW
Albumin: 2.5 — ABNORMAL LOW
Albumin: 2.5 — ABNORMAL LOW
Alkaline Phosphatase: 112
Alkaline Phosphatase: 118 — ABNORMAL HIGH
Alkaline Phosphatase: 122 — ABNORMAL HIGH
Alkaline Phosphatase: 138 — ABNORMAL HIGH
Alkaline Phosphatase: 140 — ABNORMAL HIGH
BUN: 3 — ABNORMAL LOW
BUN: 3 — ABNORMAL LOW
BUN: 6
CO2: 25
CO2: 29
Calcium: 8.6
Chloride: 104
Chloride: 112
Chloride: 114 — ABNORMAL HIGH
Creatinine, Ser: 0.79
Creatinine, Ser: 0.83
Creatinine, Ser: 0.94
Creatinine, Ser: 1.04
GFR calc Af Amer: >60
GFR calc Af Amer: >60
GFR calc Af Amer: >60
GFR calc non Af Amer: 58 — ABNORMAL LOW
GFR calc non Af Amer: >60
Glucose, Bld: 101 — ABNORMAL HIGH
Potassium: 3.2 — ABNORMAL LOW
Potassium: 4
Potassium: 4.1
Potassium: 4.2
Potassium: 4.2
Sodium: 138
Sodium: 140
Total Bilirubin: 1.4 — ABNORMAL HIGH
Total Bilirubin: 1.8 — ABNORMAL HIGH
Total Protein: 4.6 — ABNORMAL LOW
Total Protein: 4.7 — ABNORMAL LOW
Total Protein: 4.8 — ABNORMAL LOW
Total Protein: 4.8 — ABNORMAL LOW

## 2011-02-20 LAB — CBC
HCT: 33.8 — ABNORMAL LOW
Hemoglobin: 11.2 — ABNORMAL LOW
MCHC: 32.9
MCHC: 33
MCHC: 33.4
MCV: 87.1
MCV: 89.3
Platelets: 152
Platelets: 162
Platelets: 194
RDW: 18.2 — ABNORMAL HIGH
RDW: 19.3 — ABNORMAL HIGH
WBC: 11.4 — ABNORMAL HIGH

## 2011-02-20 LAB — RAPID URINE DRUG SCREEN, HOSP PERFORMED
Cocaine: POSITIVE — AB
Cocaine: POSITIVE — AB
Opiates: NOT DETECTED
Tetrahydrocannabinol: POSITIVE — AB

## 2011-02-20 LAB — URINE MICROSCOPIC-ADD ON

## 2011-02-20 LAB — PROTIME-INR
INR: 1.1
INR: 1.4
Prothrombin Time: 15
Prothrombin Time: 17.5 — ABNORMAL HIGH

## 2011-02-20 LAB — DIFFERENTIAL
Basophils Absolute: 0
Basophils Relative: 0
Basophils Relative: 0
Eosinophils Absolute: 0
Eosinophils Absolute: 0
Eosinophils Relative: 0
Eosinophils Relative: 0
Monocytes Absolute: 0.2
Monocytes Absolute: 0.6
Monocytes Relative: 5

## 2011-02-20 LAB — FERRITIN: Ferritin: 62 (ref 10–291)

## 2011-02-20 LAB — IRON AND TIBC: Iron: 28 — ABNORMAL LOW

## 2011-02-20 LAB — MONONUCLEOSIS SCREEN: Mono Screen: NEGATIVE

## 2011-02-20 LAB — ETHANOL: Alcohol, Ethyl (B): <5

## 2011-02-20 LAB — AMMONIA: Ammonia: 26

## 2011-02-20 LAB — ACETAMINOPHEN LEVEL: Acetaminophen (Tylenol), Serum: <10 — ABNORMAL LOW

## 2011-02-20 LAB — HEPATITIS PANEL, ACUTE
HCV Ab: NEGATIVE
Hep B C IgM: NEGATIVE
Hepatitis B Surface Ag: NEGATIVE

## 2011-02-20 LAB — FOLATE: Folate: 16.3

## 2011-02-21 LAB — LIPASE, BLOOD: Lipase: 25

## 2011-02-21 LAB — COMPREHENSIVE METABOLIC PANEL
AST: 31
Albumin: 3.4 — ABNORMAL LOW
Albumin: 3.4 — ABNORMAL LOW
Alkaline Phosphatase: 91
BUN: 11
BUN: 10
Calcium: 9.3
Chloride: 106
Creatinine, Ser: 1.02
Creatinine, Ser: 0.82
GFR calc Af Amer: >60
Potassium: 4.4
Total Protein: 6.5
Total Protein: 6.7

## 2011-02-21 LAB — DIFFERENTIAL
Basophils Relative: 1
Eosinophils Relative: 2
Lymphocytes Relative: 13
Lymphocytes Relative: 18
Monocytes Absolute: 0.9
Monocytes Relative: 6
Neutro Abs: 12.8 — ABNORMAL HIGH
Neutro Abs: 10.3 — ABNORMAL HIGH
Neutrophils Relative %: 73

## 2011-02-21 LAB — URINALYSIS, ROUTINE W REFLEX MICROSCOPIC
Bilirubin Urine: NEGATIVE
Glucose, UA: NEGATIVE
Glucose, UA: NEGATIVE
Ketones, ur: NEGATIVE
Ketones, ur: NEGATIVE
Nitrite: NEGATIVE
Nitrite: NEGATIVE
Nitrite: NEGATIVE
Protein, ur: NEGATIVE
Protein, ur: NEGATIVE
Protein, ur: NEGATIVE
Urobilinogen, UA: 0.2
Urobilinogen, UA: 0.2

## 2011-02-21 LAB — POCT PREGNANCY, URINE
Preg Test, Ur: NEGATIVE
Preg Test, Ur: NEGATIVE

## 2011-02-21 LAB — URINE MICROSCOPIC-ADD ON

## 2011-02-21 LAB — RPR: RPR Ser Ql: NONREACTIVE

## 2011-02-21 LAB — CBC
HCT: 37.7
HCT: 34.7 — ABNORMAL LOW
MCHC: 33.3
MCV: 87.5
Platelets: 212
Platelets: 202
RDW: 19.2 — ABNORMAL HIGH
RDW: 18.3 — ABNORMAL HIGH
WBC: 14.2 — ABNORMAL HIGH

## 2011-02-21 LAB — PREGNANCY, URINE: Preg Test, Ur: NEGATIVE

## 2011-02-21 LAB — GC/CHLAMYDIA PROBE AMP, GENITAL
Chlamydia, DNA Probe: NEGATIVE
GC Probe Amp, Genital: NEGATIVE

## 2011-02-21 LAB — WET PREP, GENITAL

## 2011-02-21 LAB — PROTIME-INR: Prothrombin Time: 12.5

## 2011-02-21 LAB — APTT: aPTT: 33

## 2011-03-10 LAB — BASIC METABOLIC PANEL
CO2: 24
Calcium: 8.3 — ABNORMAL LOW
Creatinine, Ser: 1.04
GFR calc Af Amer: >60
Glucose, Bld: 99

## 2011-03-10 LAB — URINE MICROSCOPIC-ADD ON

## 2011-03-10 LAB — URINALYSIS, ROUTINE W REFLEX MICROSCOPIC
Bilirubin Urine: NEGATIVE
Glucose, UA: NEGATIVE
Hgb urine dipstick: NEGATIVE
Ketones, ur: NEGATIVE
Leukocytes, UA: NEGATIVE
Protein, ur: NEGATIVE
Protein, ur: NEGATIVE
Specific Gravity, Urine: 1.024
Urobilinogen, UA: 0.2
pH: 6

## 2011-03-10 LAB — CBC
MCHC: 33.7
RBC: 3.56 — ABNORMAL LOW
RDW: 17.3 — ABNORMAL HIGH

## 2011-03-10 LAB — DIFFERENTIAL
Basophils Absolute: 0
Basophils Relative: 0
Neutro Abs: 5.4
Neutrophils Relative %: 54

## 2011-07-10 ENCOUNTER — Other Ambulatory Visit (INDEPENDENT_AMBULATORY_CARE_PROVIDER_SITE_OTHER): Payer: Self-pay | Admitting: General Surgery

## 2011-07-10 ENCOUNTER — Other Ambulatory Visit (HOSPITAL_COMMUNITY): Payer: Self-pay | Admitting: Family Medicine

## 2011-07-10 ENCOUNTER — Encounter (HOSPITAL_COMMUNITY): Admission: EM | Disposition: A | Discharge status: Home / Self Care | Payer: Self-pay | Source: Home / Self Care

## 2011-07-10 ENCOUNTER — Encounter (HOSPITAL_COMMUNITY): Payer: Self-pay

## 2011-07-10 ENCOUNTER — Encounter (HOSPITAL_COMMUNITY): Payer: Self-pay | Admitting: *Deleted

## 2011-07-10 ENCOUNTER — Encounter (HOSPITAL_COMMUNITY): Payer: Self-pay | Admitting: Anesthesiology

## 2011-07-10 ENCOUNTER — Inpatient Hospital Stay (HOSPITAL_COMMUNITY)
Admission: EM | Admit: 2011-07-10 | Discharge: 2011-07-22 | DRG: 339 | Disposition: A | Discharge status: Home / Self Care | Payer: Self-pay | Attending: General Surgery | Admitting: General Surgery

## 2011-07-10 ENCOUNTER — Ambulatory Visit (HOSPITAL_COMMUNITY)
Admission: RE | Admit: 2011-07-10 | Discharge: 2011-07-10 | Disposition: A | Discharge status: Home / Self Care | Payer: Self-pay | Source: Ambulatory Visit | Attending: Family Medicine | Admitting: Family Medicine

## 2011-07-10 ENCOUNTER — Emergency Department (HOSPITAL_COMMUNITY): Payer: Self-pay | Admitting: Anesthesiology

## 2011-07-10 DIAGNOSIS — K3580 Unspecified acute appendicitis: Secondary | ICD-10-CM

## 2011-07-10 DIAGNOSIS — R350 Frequency of micturition: Secondary | ICD-10-CM | POA: Diagnosis present

## 2011-07-10 DIAGNOSIS — K560 Paralytic ileus: Secondary | ICD-10-CM | POA: Diagnosis not present

## 2011-07-10 DIAGNOSIS — K37 Unspecified appendicitis: Secondary | ICD-10-CM | POA: Insufficient documentation

## 2011-07-10 DIAGNOSIS — R188 Other ascites: Secondary | ICD-10-CM | POA: Diagnosis not present

## 2011-07-10 DIAGNOSIS — Y838 Other surgical procedures as the cause of abnormal reaction of the patient, or of later complication, without mention of misadventure at the time of the procedure: Secondary | ICD-10-CM | POA: Diagnosis not present

## 2011-07-10 DIAGNOSIS — R1030 Lower abdominal pain, unspecified: Secondary | ICD-10-CM

## 2011-07-10 DIAGNOSIS — R112 Nausea with vomiting, unspecified: Secondary | ICD-10-CM | POA: Diagnosis present

## 2011-07-10 DIAGNOSIS — R110 Nausea: Secondary | ICD-10-CM | POA: Insufficient documentation

## 2011-07-10 DIAGNOSIS — F1410 Cocaine abuse, uncomplicated: Secondary | ICD-10-CM | POA: Insufficient documentation

## 2011-07-10 DIAGNOSIS — R1031 Right lower quadrant pain: Secondary | ICD-10-CM

## 2011-07-10 DIAGNOSIS — E876 Hypokalemia: Secondary | ICD-10-CM | POA: Diagnosis not present

## 2011-07-10 DIAGNOSIS — K3533 Acute appendicitis with perforation and localized peritonitis, with abscess: Principal | ICD-10-CM | POA: Diagnosis present

## 2011-07-10 DIAGNOSIS — R109 Unspecified abdominal pain: Secondary | ICD-10-CM | POA: Insufficient documentation

## 2011-07-10 DIAGNOSIS — F17200 Nicotine dependence, unspecified, uncomplicated: Secondary | ICD-10-CM | POA: Diagnosis present

## 2011-07-10 DIAGNOSIS — K929 Disease of digestive system, unspecified: Secondary | ICD-10-CM | POA: Diagnosis not present

## 2011-07-10 DIAGNOSIS — F1010 Alcohol abuse, uncomplicated: Secondary | ICD-10-CM | POA: Insufficient documentation

## 2011-07-10 HISTORY — PX: LAPAROSCOPIC APPENDECTOMY: SHX408

## 2011-07-10 LAB — DIFFERENTIAL
Lymphs Abs: 4.1 10*3/uL — ABNORMAL HIGH (ref 0.7–4.0)
Monocytes Relative: 8 % (ref 3–12)
Neutro Abs: 8.5 10*3/uL — ABNORMAL HIGH (ref 1.7–7.7)
Neutrophils Relative %: 61 % (ref 43–77)

## 2011-07-10 LAB — COMPREHENSIVE METABOLIC PANEL
Albumin: 3.8 g/dL (ref 3.5–5.2)
Alkaline Phosphatase: 103 U/L (ref 39–117)
BUN: 7 mg/dL (ref 6–23)
Chloride: 100 mEq/L (ref 96–112)
GFR calc Af Amer: >90 mL/min (ref >90)
Glucose, Bld: 95 mg/dL (ref 70–99)
Potassium: 4 mEq/L (ref 3.5–5.1)
Total Bilirubin: 0.2 mg/dL — ABNORMAL LOW (ref 0.3–1.2)

## 2011-07-10 LAB — URINALYSIS, ROUTINE W REFLEX MICROSCOPIC
Bilirubin Urine: NEGATIVE
Hgb urine dipstick: NEGATIVE
Ketones, ur: NEGATIVE mg/dL
Nitrite: NEGATIVE
pH: 6 (ref 5.0–8.0)

## 2011-07-10 LAB — POCT PREGNANCY, URINE: Preg Test, Ur: NEGATIVE

## 2011-07-10 LAB — CBC
Hemoglobin: 11.9 g/dL — ABNORMAL LOW (ref 12.0–15.0)
RBC: 4.22 MIL/uL (ref 3.87–5.11)

## 2011-07-10 LAB — LIPASE, BLOOD: Lipase: 27 U/L (ref 11–59)

## 2011-07-10 SURGERY — APPENDECTOMY, LAPAROSCOPIC
Anesthesia: General | Site: Abdomen | Wound class: Clean Contaminated

## 2011-07-10 MED ORDER — HYDROMORPHONE HCL PF 1 MG/ML IJ SOLN
INTRAMUSCULAR | Status: AC
  Filled 2011-07-10: qty 1

## 2011-07-10 MED ORDER — ONDANSETRON HCL 4 MG/2ML IJ SOLN
4.0000 mg | Freq: Once | INTRAMUSCULAR | Status: AC
  Administered 2011-07-10: 4 mg via INTRAVENOUS
  Filled 2011-07-10: qty 2

## 2011-07-10 MED ORDER — DEXAMETHASONE SODIUM PHOSPHATE 10 MG/ML IJ SOLN
INTRAMUSCULAR | Status: DC | PRN
  Administered 2011-07-10: 10 mg via INTRAVENOUS

## 2011-07-10 MED ORDER — MORPHINE SULFATE 4 MG/ML IJ SOLN: 4.0000 mg | INTRAMUSCULAR | Status: DC | PRN

## 2011-07-10 MED ORDER — MIDAZOLAM HCL 5 MG/5ML IJ SOLN
INTRAMUSCULAR | Status: DC | PRN
  Administered 2011-07-10: 2 mg via INTRAVENOUS

## 2011-07-10 MED ORDER — HYDROMORPHONE HCL PF 1 MG/ML IJ SOLN
0.2500 mg | INTRAMUSCULAR | Status: DC | PRN
  Administered 2011-07-10 (×4): 0.5 mg via INTRAVENOUS

## 2011-07-10 MED ORDER — NEOSTIGMINE METHYLSULFATE 1 MG/ML IJ SOLN
INTRAMUSCULAR | Status: DC | PRN
  Administered 2011-07-10: 4 mg via INTRAVENOUS

## 2011-07-10 MED ORDER — BUPIVACAINE HCL (PF) 0.25 % IJ SOLN
INTRAMUSCULAR | Status: AC
  Filled 2011-07-10: qty 30

## 2011-07-10 MED ORDER — PANTOPRAZOLE SODIUM 40 MG IV SOLR
40.0000 mg | Freq: Every day | INTRAVENOUS | Status: DC
  Administered 2011-07-11 – 2011-07-20 (×10): 40 mg via INTRAVENOUS
  Filled 2011-07-10 (×11): qty 40

## 2011-07-10 MED ORDER — HYDROMORPHONE HCL PF 1 MG/ML IJ SOLN
1.0000 mg | Freq: Once | INTRAMUSCULAR | Status: AC
  Administered 2011-07-10: 1 mg via INTRAVENOUS
  Filled 2011-07-10: qty 1

## 2011-07-10 MED ORDER — PANTOPRAZOLE SODIUM 40 MG IV SOLR
40.0000 mg | Freq: Every day | INTRAVENOUS | Status: DC
  Filled 2011-07-10 (×2): qty 40

## 2011-07-10 MED ORDER — ERTAPENEM SODIUM 1 G IJ SOLR
1.0000 g | INTRAMUSCULAR | Status: DC
  Administered 2011-07-11 – 2011-07-16 (×6): 1 g via INTRAVENOUS
  Filled 2011-07-10 (×7): qty 1

## 2011-07-10 MED ORDER — SODIUM CHLORIDE 0.9 % IV SOLN
INTRAVENOUS | Status: AC
  Filled 2011-07-10: qty 1

## 2011-07-10 MED ORDER — ROCURONIUM BROMIDE 100 MG/10ML IV SOLN
INTRAVENOUS | Status: DC | PRN
  Administered 2011-07-10: 30 mg via INTRAVENOUS

## 2011-07-10 MED ORDER — PROPOFOL 10 MG/ML IV BOLUS
INTRAVENOUS | Status: DC | PRN
  Administered 2011-07-10: 150 mg via INTRAVENOUS

## 2011-07-10 MED ORDER — ONDANSETRON HCL 4 MG/2ML IJ SOLN: 4.0000 mg | Freq: Four times a day (QID) | INTRAMUSCULAR | Status: DC | PRN

## 2011-07-10 MED ORDER — ACETAMINOPHEN 650 MG RE SUPP
650.0000 mg | Freq: Four times a day (QID) | RECTAL | Status: DC | PRN
  Administered 2011-07-13: 650 mg via RECTAL
  Filled 2011-07-10: qty 1

## 2011-07-10 MED ORDER — ACETAMINOPHEN 325 MG PO TABS
650.0000 mg | ORAL_TABLET | Freq: Four times a day (QID) | ORAL | Status: DC | PRN
  Administered 2011-07-12 – 2011-07-20 (×2): 650 mg via ORAL
  Filled 2011-07-10 (×2): qty 2

## 2011-07-10 MED ORDER — SODIUM CHLORIDE 0.9 % IV SOLN
INTRAVENOUS | Status: DC
  Administered 2011-07-10 – 2011-07-11 (×3): via INTRAVENOUS
  Administered 2011-07-12 (×2): 1000 mL via INTRAVENOUS

## 2011-07-10 MED ORDER — ONDANSETRON HCL 4 MG/2ML IJ SOLN
INTRAMUSCULAR | Status: DC | PRN
  Administered 2011-07-10: 4 mg via INTRAVENOUS

## 2011-07-10 MED ORDER — KCL IN DEXTROSE-NACL 20-5-0.9 MEQ/L-%-% IV SOLN
INTRAVENOUS | Status: DC
  Filled 2011-07-10 (×3): qty 1000

## 2011-07-10 MED ORDER — SUCCINYLCHOLINE CHLORIDE 20 MG/ML IJ SOLN
INTRAMUSCULAR | Status: DC | PRN
  Administered 2011-07-10: 100 mg via INTRAVENOUS

## 2011-07-10 MED ORDER — ALBUTEROL SULFATE HFA 108 (90 BASE) MCG/ACT IN AERS: 2.0000 | INHALATION_SPRAY | RESPIRATORY_TRACT | Status: DC | PRN

## 2011-07-10 MED ORDER — GLYCOPYRROLATE 0.2 MG/ML IJ SOLN
INTRAMUSCULAR | Status: DC | PRN
  Administered 2011-07-10: .6 mg via INTRAVENOUS

## 2011-07-10 MED ORDER — BUPIVACAINE HCL 0.25 % IJ SOLN
INTRAMUSCULAR | Status: DC | PRN
  Administered 2011-07-10: 15 mL

## 2011-07-10 MED ORDER — LACTATED RINGERS IR SOLN
Status: DC | PRN
  Administered 2011-07-10: 1000 mL

## 2011-07-10 MED ORDER — LIDOCAINE HCL (CARDIAC) 20 MG/ML IV SOLN
INTRAVENOUS | Status: DC | PRN
  Administered 2011-07-10: 50 mg via INTRAVENOUS

## 2011-07-10 MED ORDER — ONDANSETRON HCL 4 MG/2ML IJ SOLN
4.0000 mg | Freq: Four times a day (QID) | INTRAMUSCULAR | Status: DC | PRN
  Administered 2011-07-12 – 2011-07-13 (×2): 4 mg via INTRAVENOUS
  Filled 2011-07-10 (×2): qty 2

## 2011-07-10 MED ORDER — PROMETHAZINE HCL 25 MG/ML IJ SOLN: 6.2500 mg | INTRAMUSCULAR | Status: DC | PRN

## 2011-07-10 MED ORDER — SODIUM CHLORIDE 0.9 % IV SOLN
Freq: Once | INTRAVENOUS | Status: AC
  Administered 2011-07-10: 16:00:00 via INTRAVENOUS

## 2011-07-10 MED ORDER — LACTATED RINGERS IV SOLN
INTRAVENOUS | Status: DC | PRN
  Administered 2011-07-10 (×2): via INTRAVENOUS

## 2011-07-10 MED ORDER — MORPHINE SULFATE 2 MG/ML IJ SOLN
INTRAMUSCULAR | Status: AC
  Filled 2011-07-10: qty 1

## 2011-07-10 MED ORDER — IOHEXOL 300 MG/ML  SOLN
100.0000 mL | Freq: Once | INTRAMUSCULAR | Status: AC | PRN
  Administered 2011-07-10: 100 mL via INTRAVENOUS

## 2011-07-10 MED ORDER — FENTANYL CITRATE 0.05 MG/ML IJ SOLN
INTRAMUSCULAR | Status: DC | PRN
  Administered 2011-07-10: 50 ug via INTRAVENOUS
  Administered 2011-07-10: 100 ug via INTRAVENOUS
  Administered 2011-07-10: 50 ug via INTRAVENOUS

## 2011-07-10 MED ORDER — MORPHINE SULFATE 2 MG/ML IJ SOLN
2.0000 mg | INTRAMUSCULAR | Status: DC | PRN
  Administered 2011-07-10 – 2011-07-11 (×5): 2 mg via INTRAVENOUS
  Filled 2011-07-10 (×4): qty 1

## 2011-07-10 MED ORDER — ERTAPENEM SODIUM 1 G IJ SOLR
1.0000 g | INTRAMUSCULAR | Status: DC
  Administered 2011-07-10: 1 g via INTRAVENOUS
  Filled 2011-07-10 (×2): qty 1

## 2011-07-10 SURGICAL SUPPLY — 37 items
CABLE HIGH FREQUENCY MONO STRZ (ELECTRODE) ×2 IMPLANT
CANISTER SUCTION 2500CC (MISCELLANEOUS) ×2 IMPLANT
CLOTH BEACON ORANGE TIMEOUT ST (SAFETY) ×2 IMPLANT
COVER SURGICAL LIGHT HANDLE (MISCELLANEOUS) ×2 IMPLANT
CUTTER FLEX LINEAR 45M (STAPLE) ×2 IMPLANT
DECANTER SPIKE VIAL GLASS SM (MISCELLANEOUS) IMPLANT
DERMABOND ADVANCED (GAUZE/BANDAGES/DRESSINGS) ×2
DERMABOND ADVANCED .7 DNX12 (GAUZE/BANDAGES/DRESSINGS) ×2 IMPLANT
DRAPE LAPAROSCOPIC ABDOMINAL (DRAPES) ×2 IMPLANT
ELECT REM PT RETURN 9FT ADLT (ELECTROSURGICAL) ×2
ELECTRODE REM PT RTRN 9FT ADLT (ELECTROSURGICAL) ×1 IMPLANT
GLOVE BIO SURGEON STRL SZ7 (GLOVE) ×4 IMPLANT
GLOVE BIOGEL PI IND STRL 7.0 (GLOVE) ×1 IMPLANT
GLOVE BIOGEL PI IND STRL 7.5 (GLOVE) ×2 IMPLANT
GLOVE BIOGEL PI INDICATOR 7.0 (GLOVE) ×1
GLOVE BIOGEL PI INDICATOR 7.5 (GLOVE) ×2
GOWN PREVENTION PLUS LG XLONG (DISPOSABLE) ×2 IMPLANT
GOWN PREVENTION PLUS XLARGE (GOWN DISPOSABLE) ×2 IMPLANT
GOWN STRL NON-REIN LRG LVL3 (GOWN DISPOSABLE) ×2 IMPLANT
GOWN STRL REIN XL XLG (GOWN DISPOSABLE) ×2 IMPLANT
KIT BASIN OR (CUSTOM PROCEDURE TRAY) ×2 IMPLANT
NS IRRIG 1000ML POUR BTL (IV SOLUTION) ×2 IMPLANT
PENCIL BUTTON HOLSTER BLD 10FT (ELECTRODE) IMPLANT
POUCH SPECIMEN RETRIEVAL 10MM (ENDOMECHANICALS) ×2 IMPLANT
RELOAD 45 VASCULAR/THIN (ENDOMECHANICALS) IMPLANT
RELOAD STAPLE TA45 3.5 REG BLU (ENDOMECHANICALS) ×4 IMPLANT
SCALPEL HARMONIC ACE (MISCELLANEOUS) ×2 IMPLANT
SET IRRIG TUBING LAPAROSCOPIC (IRRIGATION / IRRIGATOR) ×2 IMPLANT
SOLUTION ANTI FOG 6CC (MISCELLANEOUS) ×2 IMPLANT
SUT MNCRL AB 4-0 PS2 18 (SUTURE) ×2 IMPLANT
SUT VICRYL 0 ENDOLOOP (SUTURE) IMPLANT
TOWEL OR 17X26 10 PK STRL BLUE (TOWEL DISPOSABLE) ×4 IMPLANT
TRAY FOLEY CATH 14FRSI W/METER (CATHETERS) ×2 IMPLANT
TRAY LAP CHOLE (CUSTOM PROCEDURE TRAY) ×2 IMPLANT
TROCAR BLADELESS OPT 5 75 (ENDOMECHANICALS) ×4 IMPLANT
TROCAR XCEL BLUNT TIP 100MML (ENDOMECHANICALS) ×2 IMPLANT
TUBING INSUFFLATION 10FT LAP (TUBING) ×2 IMPLANT

## 2011-07-10 NOTE — Anesthesia Postprocedure Evaluation (Signed)
  Anesthesia Post-op Note  Patient: [name]  Procedure(s) Performed: Procedure(s) (LRB): APPENDECTOMY LAPAROSCOPIC (N/A)  Patient Location: PACU  Anesthesia Type: General  Level of Consciousness: awake and alert   Airway and Oxygen Therapy: Patient Spontanous Breathing  Post-op Pain: mild  Post-op Assessment: Post-op Vital signs reviewed, Patient's Cardiovascular Status Stable, Respiratory Function Stable, Patent Airway and No signs of Nausea or vomiting  Post-op Vital Signs: stable  Complications: No apparent anesthesia complications

## 2011-07-10 NOTE — ED Notes (Signed)
MD/PA at bedside. 

## 2011-07-10 NOTE — Anesthesia Preprocedure Evaluation (Addendum)
Anesthesia Evaluation  Patient identified by MRN, date of birth, ID band Patient awake    Reviewed: Allergy & Precautions, H&P , NPO status , Patient's Chart, lab work & pertinent test results  Airway Mallampati: II TM Distance: >3 FB Neck ROM: Full    Dental   Chip left upper incisor.:   Pulmonary asthma , Current Smoker,  clear to auscultation  Pulmonary exam normal       Cardiovascular neg cardio ROS Regular Normal    Neuro/Psych PSYCHIATRIC DISORDERS Depression H/o suicidal ideation with drug ODNegative Neurological ROS     GI/Hepatic negative GI ROS, Neg liver ROS,   Endo/Other  Negative Endocrine ROS  Renal/GU negative Renal ROS  Genitourinary negative   Musculoskeletal negative musculoskeletal ROS (+)   Abdominal   Peds negative pediatric ROS (+)  Hematology negative hematology ROS (+)   Anesthesia Other Findings   Reproductive/Obstetrics negative OB ROS Negative pregnancy test                          Anesthesia Physical Anesthesia Plan  ASA: II  Anesthesia Plan: General   Post-op Pain Management:    Induction: Intravenous  Airway Management Planned: Oral ETT  Additional Equipment:   Intra-op Plan:   Post-operative Plan: Extubation in OR  Informed Consent: I have reviewed the patients History and Physical, chart, labs and discussed the procedure including the risks, benefits and alternatives for the proposed anesthesia with the patient or authorized representative who has indicated his/her understanding and acceptance.   Dental advisory given  Plan Discussed with: CRNA  Anesthesia Plan Comments:         Anesthesia Quick Evaluation

## 2011-07-10 NOTE — Transfer of Care (Signed)
Immediate Anesthesia Transfer of Care Note  Patient: [name]  Procedure(s) Performed: Procedure(s) (LRB): APPENDECTOMY LAPAROSCOPIC (N/A)  Patient Location: PACU  Anesthesia Type: General  Level of Consciousness: sedated  Airway & Oxygen Therapy: Patient Spontanous Breathing and Patient connected to face mask oxygen  Post-op Assessment: Report given to PACU RN and Post -op Vital signs reviewed and stable  Post vital signs: Reviewed and stable  Complications: No apparent anesthesia complications

## 2011-07-10 NOTE — Anesthesia Procedure Notes (Signed)
Procedure Name: Intubation Date/Time: 07/10/2011 6:04 PM Performed by: Joycie Peek Pre-anesthesia Checklist: Patient identified, Timeout performed, Emergency Drugs available, Suction available and Patient being monitored Patient Re-evaluated:Patient Re-evaluated prior to inductionOxygen Delivery Method: Circle System Utilized Preoxygenation: Pre-oxygenation with 100% oxygen Intubation Type: IV induction, Rapid sequence and Cricoid Pressure applied Laryngoscope Size: Mac and 4 Grade View: Grade I Tube type: Oral Tube size: 7.5 mm Number of attempts: 1 Airway Equipment and Method: stylet Placement Confirmation: breath sounds checked- equal and bilateral,  ETT inserted through vocal cords under direct vision and positive ETCO2 Secured at: 22 cm Tube secured with: Tape Dental Injury: Teeth and Oropharynx as per pre-operative assessment

## 2011-07-10 NOTE — ED Notes (Signed)
Vital signs stable. 

## 2011-07-10 NOTE — ED Notes (Signed)
Pt reports LLQ pain that wraps around to flank. Denies RLQ pain. Went to UC and was sent to radiology for CT, then sent from CT to here for questionable appendicitis. Also reports painful urination. C/o nausea, denies vomiting, fever.

## 2011-07-10 NOTE — H&P (Signed)
[name] is an 46 y.o. female.   Chief Complaint: Abdominal pain Primary Care:  [name]  HPI: Patient is a 46 year old female who developed abdominal pain 3 days ago. She was seen on 07/07/2011 at an urgent care. She had some dysuria, and was treated with Percocet and antibiotic for a urinary tract infection. Patient says her symptoms never improved. She's had some nausea, anorexia, she's been eating some crackers. She's had no vomiting. She developed diarrhea the second day after starting antibiotics but this has not been persistent. Because of ongoing discomfort she presented to urgent care again and they sent her to the ER. CT scan shows inflammation around the cecum and appendix. The appendix appeared thickened. There is concern that this would represent appendicitis with inflammation of the surrounding flap fat plane. She also has a complex bilateral ovarian structures. CMP on admission was normal. WBC was elevated at 14,000. Urinalysis was negative. She was seen and evaluated by [name]. On his exam she had more pain and tenderness in the right lower quadrant. It was his opinion she probably had acute appendicitis and he recommended appendectomy. He did note that the pain in her back and left flank were not associated with this, and appendectomy  would not resolve that issue. She does note that she has menorrhagia and her pain is similar to that.  Past Medical History  Diagnosis Date  . Depression     Follows with [facility], history of voluntary admission to [facility].  History of suisidal ideation with drug od (50 pills of ibuprofen).   . Bronchial asthma  Last seen 06/26/11   . Tobacco abuse -Ongoing   . History of cocaine abuse     Quit in 2009  . Marijuana abuse     Hx of, quit in 2009  . Alcohol abuse     Hx of, quit in 2009  . Transaminitis     Considered to be secondary to alchol use.   . Adnexal mass 2006    Bilateral ovarian cystic masses- recomended GYN FU.   . Menorrhagia 2006      Endometiral Biopsy- DEGENERATING SECRETORY-TYPE ENDOMETRIUM  . Ankle fracture     Bimalleolar sp closed reduction under floroscopy.   . Normocytic anemia     Past Surgical History  Procedure Date  . Close reduction of bimalleolar ankle fracture     Family History  Problem Relation Age of Onset  . Diabetes Mother   . Hypertension Mother   . Hypertension Father   . Diabetes Brother   . Obesity Brother    Social History:  reports that she has been smoking.  She does not have any smokeless tobacco history on file. She reports that she does not drink alcohol or use illicit drugs. patient reports no drug or R. call use since 2009.Marland Kitchen  Allergies: No Known Allergies  Medications Prior to Admission  Medication Dose Route Frequency Provider Last Rate Last Dose  . 0.9 %  sodium chloride infusion   Intravenous Once [name], MD 125 mL/hr at 07/10/11 1546    . HYDROmorphone (DILAUDID) injection 1 mg  1 mg Intravenous Once [name], MD   1 mg at 07/10/11 1545  . iohexol (OMNIPAQUE) 300 MG/ML solution 100 mL  100 mL Intravenous Once PRN Medication Radiologist, MD   100 mL at 07/10/11 1131  . ondansetron (ZOFRAN) injection 4 mg  4 mg Intravenous Once [name], MD   4 mg at 07/10/11 1545  Medications Prior to Admission  Medication Sig Dispense Refill  . albuterol (VENTOLIN HFA) 108 (90 BASE) MCG/ACT inhaler Inhale 2 puffs into the lungs every 4 (four) hours as needed. For shortness of breath.         Results for orders placed during the hospital encounter of 07/10/11 (from the past 48 hour(s))  CBC     Status: Abnormal   Collection Time   07/10/11  1:10 PM      Component Value Range Comment   WBC 14.0 (*) 4.0 - 10.5 (K/uL)    RBC 4.22  3.87 - 5.11 (MIL/uL)    Hemoglobin 11.9 (*) 12.0 - 15.0 (g/dL)    HCT 57.8  46.9 - 62.9 (%)    MCV 85.5  78.0 - 100.0 (fL)    MCH 28.2  26.0 - 34.0 (pg)    MCHC 33.0  30.0 - 36.0 (g/dL)    RDW 52.8 (*) 41.3 - 15.5 (%)    Platelets 216  150 -  400 (K/uL)   DIFFERENTIAL     Status: Abnormal   Collection Time   07/10/11  1:10 PM      Component Value Range Comment   Neutrophils Relative 61  43 - 77 (%)    Neutro Abs 8.5 (*) 1.7 - 7.7 (K/uL)    Lymphocytes Relative 30  12 - 46 (%)    Lymphs Abs 4.1 (*) 0.7 - 4.0 (K/uL)    Monocytes Relative 8  3 - 12 (%)    Monocytes Absolute 1.2 (*) 0.1 - 1.0 (K/uL)    Eosinophils Relative 1  0 - 5 (%)    Eosinophils Absolute 0.2  0.0 - 0.7 (K/uL)    Basophils Relative 0  0 - 1 (%)    Basophils Absolute 0.0  0.0 - 0.1 (K/uL)   COMPREHENSIVE METABOLIC PANEL     Status: Abnormal   Collection Time   07/10/11  1:10 PM      Component Value Range Comment   Sodium 135  135 - 145 (mEq/L)    Potassium 4.0  3.5 - 5.1 (mEq/L)    Chloride 100  96 - 112 (mEq/L)    CO2 26  19 - 32 (mEq/L)    Glucose, Bld 95  70 - 99 (mg/dL)    BUN 7  6 - 23 (mg/dL)    Creatinine, Ser 2.44  0.50 - 1.10 (mg/dL)    Calcium 9.5  8.4 - 10.5 (mg/dL)    Total Protein 7.7  6.0 - 8.3 (g/dL)    Albumin 3.8  3.5 - 5.2 (g/dL)    AST 13  0 - 37 (U/L)    ALT 7  0 - 35 (U/L)    Alkaline Phosphatase 103  39 - 117 (U/L)    Total Bilirubin 0.2 (*) 0.3 - 1.2 (mg/dL)    GFR calc non Af Amer >90  >90 (mL/min)    GFR calc Af Amer >90  >90 (mL/min)   LIPASE, BLOOD     Status: Normal   Collection Time   07/10/11  1:10 PM      Component Value Range Comment   Lipase 27  11 - 59 (U/L)   URINALYSIS, ROUTINE W REFLEX MICROSCOPIC     Status: Abnormal   Collection Time   07/10/11  2:20 PM      Component Value Range Comment   Color, Urine YELLOW  YELLOW     APPearance CLEAR  CLEAR     Specific Gravity,  Urine 1.046 (*) 1.005 - 1.030     pH 6.0  5.0 - 8.0     Glucose, UA NEGATIVE  NEGATIVE (mg/dL)    Hgb urine dipstick NEGATIVE  NEGATIVE     Bilirubin Urine NEGATIVE  NEGATIVE     Ketones, ur NEGATIVE  NEGATIVE (mg/dL)    Protein, ur NEGATIVE  NEGATIVE (mg/dL)    Urobilinogen, UA 0.2  0.0 - 1.0 (mg/dL)    Nitrite NEGATIVE  NEGATIVE      Leukocytes, UA NEGATIVE  NEGATIVE  MICROSCOPIC NOT DONE ON URINES WITH NEGATIVE PROTEIN, BLOOD, LEUKOCYTES, NITRITE, OR GLUCOSE <1000 mg/dL.  POCT PREGNANCY, URINE     Status: Normal   Collection Time   07/10/11  2:27 PM      Component Value Range Comment   Preg Test, Ur NEGATIVE  NEGATIVE     Ct Abdomen Pelvis W Contrast  07/10/2011  *RADIOLOGY REPORT*  Clinical Data: Left flank pain for past week.  Nausea.  Alcohol and crack abuse.  CT ABDOMEN AND PELVIS WITH CONTRAST  Technique:  Multidetector CT imaging of the abdomen and pelvis was performed following the standard protocol during bolus administration of intravenous contrast.  Contrast: OMNIPAQUE IOHEXOL 300 MG/ML IV SOLN  Comparison: 12/30/2007.  Findings: Inflammation surrounds the cecum and appendix.  The appendix appears thickened.  This may represent result of appendicitis with inflammation of surrounding fat planes rather than primary cecal abnormality.  Clinical correlation recommended. The terminal ileum does not appear to be involved. Surrounding small lymph nodes.  Complex bilateral ovarian cystic structures.  Etiology indeterminate.  Prominent cystic structures noted on the prior examination.  Correlation with elective pelvic ultrasound after acute episode has cleared may be considered.  Heterogeneous appearance of the uterus suggesting the presence of fibroids.  No free intraperitoneal air.  Lung bases clear.  Focal fatty sparing left lobe of the liver. Right lobe of the liver 1.1 cm hyperdense lesion (series 2 image 23) and dome of the liver 1 cm lesion (series 2 image 12) without change.  Etiology indeterminate.  No definitive findings of cirrhosis.  Spleen is not enlarged.  Portal vein and splenic vein are patent. No focal splenic, pancreatic or adrenal lesion. No focal renal mass or hydronephrosis.  Inferior to the left renal vein is a low density nonspecific 3.2 cm structure which is without change.  Degenerative changes lower  lumbar spine.  Small sclerotic focus right ilium and lucent lesion left ilium without change.  IMPRESSION: Findings raise possibility of appendicitis as detailed above.  Remainder of findings as detailed above do not appear acute and may require follow-up.  Original Report Authenticated By: Fuller Canada, M.D.    Review of Systems  Constitutional: Positive for fever (not sure, may have been up earlier today.). Negative for chills, weight loss, malaise/fatigue and diaphoresis.  HENT: Negative.   Eyes: Negative.   Respiratory: Positive for shortness of breath (DOE) and wheezing.   Cardiovascular: Negative.   Gastrointestinal: Positive for heartburn, nausea, diarrhea and constipation. Negative for vomiting, blood in stool and melena. Abdominal pain: Started L flank and across abdomen, 3 days ago.  Genitourinary: Positive for dysuria.       Treated 3 days ago for UTI, with unknown antibiotic, same pain  Musculoskeletal: Negative.   Neurological: Negative.  Negative for weakness.  Endo/Heme/Allergies: Negative.   Psychiatric/Behavioral: Negative.     Blood pressure 123/80, pulse 70, temperature 98.1 F (36.7 C), temperature source Oral, resp. rate 16, last menstrual  period 06/22/2011, SpO2 100.00%. Physical Exam  Constitutional: She is oriented to person, place, and time. She appears well-developed and well-nourished. No distress.       Rate her pain 9/10  Cardiovascular: Normal rate, regular rhythm, normal heart sounds and intact distal pulses.  Exam reveals no gallop and no friction rub.   No murmur heard. Respiratory: Effort normal. No respiratory distress. She has wheezes (some mild wheezing). She has no rales. She exhibits no tenderness.  GI: Soft. Bowel sounds are normal. She exhibits no distension. There is tenderness (Tender and pain RLQ worse on 2nd exam than 1st.  Also has pain and it started in L flank that was inital site of pain.going across to RLQ.). There is no rebound and no  guarding.  Neurological: She is alert and oriented to person, place, and time. She has normal reflexes. No cranial nerve deficit.  Skin: Skin is warm and dry.  Psychiatric: She has a normal mood and affect. Her behavior is normal. Judgment and thought content normal.     Assessment/Plan 1. Abdominal pain with appendicitis. 2. History of recent treatment for UTI, ongoing left flank discomfort. 3. Asthma 4. Ongoing tobacco use. 5. History of polysubstance abuse; alcohol, tobacco, marijuana, cocaine. None since 2009. 6. History of bilateral ovarian cyst masses, menorrhagia. 7. History of depression prior [facility] hospitalization 8. Prior history of transaminitis thought to be secondary to alcohol use. Negative screen for hepatitis.  Plan: Patient's been seen by [name], we plan to arrange for appendectomy after review by Dr. Dwain Sarna. Will [facility] physician assistant for Dr. Hortense Ramal.  Arilynn Blakeney 07/10/2011, 4:46 PM

## 2011-07-10 NOTE — ED Notes (Signed)
Patient is resting comfortably. 

## 2011-07-10 NOTE — ED Provider Notes (Signed)
History     CSN: [id number]  Arrival date & time 07/10/11  1212   First MD Initiated Contact with Patient 07/10/11 1217      Chief Complaint  Patient presents with  . Abdominal Pain    (Consider location/radiation/quality/duration/timing/severity/associated sxs/prior treatment) Patient is a 46 y.o. female presenting with abdominal pain. The history is provided by the patient.  Abdominal Pain The primary symptoms of the illness include abdominal pain.  She was sent here following a CT scan which was reported to show appendicitis. She has been having left lower quadrant pain for the last 3 days. Pain radiates to the left flank and sometimes across the suprapubic area. Pain is crampy in nature and severe she rates it 9/10. It is getting worse. Pain is worse with urination, but nothing makes it any better. She also complains of urinary urgency, frequency, and tenesmus. She was seen at urgent care is several days ago and told that she might have a urinary tract infection and was put on some antibiotics which have not helped. She was also given Vicodin which has not helped. She's not sure which antibiotic she is taking. She denies nausea, vomiting, constipation. She has had diarrhea. She denies fever, chills, sweats. She last ate at about one hour ago, but states he was just a few french fries.  Past Medical History  Diagnosis Date  . Depression     Follows with [facility], history of voluntary admission to [facility].  History of suisidal ideation with drug od (50 pills of ibuprofen).   . Bronchial asthma   . Tobacco abuse   . History of cocaine abuse     Quit in 2009  . Marijuana abuse     Hx of, quit in 2009  . Alcohol abuse     Hx of, quit in 2009  . Transaminitis     Considered to be secondary to alchol use.   . Adnexal mass 2006    Bilateral ovarian cystic masses- recomended GYN FU.   . Menorrhagia 2006    Endometiral Biopsy- DEGENERATING SECRETORY-TYPE ENDOMETRIUM  . Ankle fracture    Bimalleolar sp closed reduction under floroscopy.   . Normocytic anemia     Past Surgical History  Procedure Date  . Close reduction of bimalleolar ankle fracture     Family History  Problem Relation Age of Onset  . Diabetes Mother   . Hypertension Mother   . Hypertension Father   . Diabetes Brother   . Obesity Brother     History  Substance Use Topics  . Smoking status: Current Everyday Smoker -- 0.5 packs/day for 25 years  . Smokeless tobacco: Not on file  . Alcohol Use: No     Quit in 09.     OB History    Grav Para Term Preterm Abortions TAB SAB Ect Mult Living                  Review of Systems  Gastrointestinal: Positive for abdominal pain.  All other systems reviewed and are negative.    Allergies  Review of patient's allergies indicates no known allergies.  Home Medications   Current Outpatient Rx  Name Route Sig Dispense Refill  . ALBUTEROL SULFATE HFA 108 (90 BASE) MCG/ACT IN AERS Inhalation Inhale 2 puffs into the lungs every 4 (four) hours as needed. For shortness of breath.     Marland Kitchen FLUTICASONE-SALMETEROL 100-50 MCG/DOSE IN AEPB Inhalation Inhale 1 puff into the lungs every 12 (twelve) hours.  BP 123/80  Pulse 70  Temp(Src) 98.1 F (36.7 C) (Oral)  Resp 16  SpO2 100%  LMP 06/22/2011  Physical Exam  Nursing note and vitals reviewed.  46 year old female who is resting comfortably and in no acute distress. Vital signs are normal. Oxygen saturation is 100% which is normal. Head is normocephalic and atraumatic. PERRLA, EOMI. There is no scleral icterus. Mucous membranes are moist. Oropharynx is clear. Neck is nontender and supple. Lungs are clear without rales, wheezes, rhonchi. Back is nontender. There is mild left CVA tenderness. Heart has regular rate rhythm without murmur. There is no chest wall tenderness. Abdomen is soft, flat, with tenderness across the lower abdomen. Worst tenderness is over McBurney's area in the right lower quadrant.  There is no rebound or guarding. Some pain is elicited with cough. Extremities have no cyanosis or edema, full range of motion is present. Skin is warm and dry without rash. Neurologic: Mental status is normal, cranial nerves are intact, there no focal motor or sensory deficits.  ED Course  Procedures (including critical care time)   Labs Reviewed  CBC  DIFFERENTIAL  COMPREHENSIVE METABOLIC PANEL  LIPASE, BLOOD  URINALYSIS, ROUTINE W REFLEX MICROSCOPIC   Ct Abdomen Pelvis W Contrast  07/10/2011  *RADIOLOGY REPORT*  Clinical Data: Left flank pain for past week.  Nausea.  Alcohol and crack abuse.  CT ABDOMEN AND PELVIS WITH CONTRAST  Technique:  Multidetector CT imaging of the abdomen and pelvis was performed following the standard protocol during bolus administration of intravenous contrast.  Contrast: OMNIPAQUE IOHEXOL 300 MG/ML IV SOLN  Comparison: 12/30/2007.  Findings: Inflammation surrounds the cecum and appendix.  The appendix appears thickened.  This may represent result of appendicitis with inflammation of surrounding fat planes rather than primary cecal abnormality.  Clinical correlation recommended. The terminal ileum does not appear to be involved. Surrounding small lymph nodes.  Complex bilateral ovarian cystic structures.  Etiology indeterminate.  Prominent cystic structures noted on the prior examination.  Correlation with elective pelvic ultrasound after acute episode has cleared may be considered.  Heterogeneous appearance of the uterus suggesting the presence of fibroids.  No free intraperitoneal air.  Lung bases clear.  Focal fatty sparing left lobe of the liver. Right lobe of the liver 1.1 cm hyperdense lesion (series 2 image 23) and dome of the liver 1 cm lesion (series 2 image 12) without change.  Etiology indeterminate.  No definitive findings of cirrhosis.  Spleen is not enlarged.  Portal vein and splenic vein are patent. No focal splenic, pancreatic or adrenal lesion. No  focal renal mass or hydronephrosis.  Inferior to the left renal vein is a low density nonspecific 3.2 cm structure which is without change.  Degenerative changes lower lumbar spine.  Small sclerotic focus right ilium and lucent lesion left ilium without change.  IMPRESSION: Findings raise possibility of appendicitis as detailed above.  Remainder of findings as detailed above do not appear acute and may require follow-up.  Original Report Authenticated By: Fuller Canada, M.D.    I have discussed case with Mr. Marlyne Beards who is a PA on call for [facility] who agrees to come to the emergency department to admit the patient.  1. Abdominal pain        MDM  I reviewed the CT scan and the CT scan report. There is definite stranding in the right lower quadrant but it is not definite 80 appendicitis. Laboratory workup will be done and surgical consultation obtained.  [name], MD 07/13/11 [phone]

## 2011-07-10 NOTE — Op Note (Signed)
Preoperative dx: Acute appendicitis Postoperative dx: Appendiceal abscess Procedure: Laparoscopic appendectomy Surgeon: Dr. Harden Mo Anes: GETA Drains: none Specimen: appendix to pathology Complications: none Sponge and needle count correct x2  Disposition of patient to PACU in stable condition  Indications: This is a 46 year old female with several days of abdominal pain. She has an elevated white blood cell count. Her exam and her CT scan are consistent with appendicitis. I discussed the laparoscopic possible open appendectomy.  Procedure: After informed consent was obtained the patient was taken to the operating room. She was administered antibiotics prior to beginning. Sequential compression devices were placed her legs prior to beginning. She was placed her general anesthesia without complication. Foley catheter was placed. Her abdomen was prepped and draped in the standard sterile surgical fashion. A surgical timeout was performed.  I then made an incision in a vertical fashion below her umbilicus. This was carried out down to her fascia. The fascia was entered sharply. Her peritoneum was entered bluntly. I then placed a 0 Vicryl pursestring suture through the fascia. Hasson trocar was introduced and the abdomen was then insufflated to 15 mmHg pressure. I then placed 2 further 5 mm trocars after infiltration with local anesthetic under direct vision in the suprapubic region and left lower quadrant. She was noted to have some murky fluid present in her pelvis. I then identified her cecum as well as an appendix that was clearly had acute appendicitis. She also had an appendiceal abscess right near the base of her appendix. With some difficulty I was eventually able to free the appendix from the surrounding structures. I was able to identify the small bowel the cecum and those remained uninjured throughout the operation. I did take down the white line of Toldt with the harmonic scalpel. I  freed the appendiceal mesentery which was very thickened and divided this with the harmonic scalpel. Eventually I was able to get to what was a abscess around the base of the appendix. I was able to enter into this and I stapled across the base of the appendix was fallen apart. I then placed as an Endo Catch bag and removed from the umbilicus. There was a small portion of the stump that still needed to come off so I then re\re stapled across the base to nice healthy cecum.The base looked clean at this point. I irrigated copiously. It was hemostatic. I evacuated all the fluid that I could. I then removed all the trocars. I then placed an additional 0 Vicryl stitch through the umbilical incision. I then closed all these with 4-0 Monocryl and Dermabond. She tolerated this well was transferred to the recovery room in stable condition.

## 2011-07-10 NOTE — ED Notes (Signed)
Family at bedside. 

## 2011-07-10 NOTE — H&P (Signed)
She does have rlq pain on exam, elevated wbc and concern for appendicitis on ct scan.  I recommended dx lsc with likely appendicitis.  I cannot really explain the left sided pain right now.  We discussed lap appy with risks associated with it.

## 2011-07-11 LAB — CBC
HCT: 32.4 % — ABNORMAL LOW (ref 36.0–46.0)
Hemoglobin: 10.6 g/dL — ABNORMAL LOW (ref 12.0–15.0)
MCH: 27.8 pg (ref 26.0–34.0)
MCHC: 32.7 g/dL (ref 30.0–36.0)
MCV: 85 fL (ref 78.0–100.0)

## 2011-07-11 LAB — BASIC METABOLIC PANEL
BUN: 5 mg/dL — ABNORMAL LOW (ref 6–23)
CO2: 22 mEq/L (ref 19–32)
Chloride: 102 mEq/L (ref 96–112)
GFR calc non Af Amer: >90 mL/min (ref >90)
Glucose, Bld: 141 mg/dL — ABNORMAL HIGH (ref 70–99)
Potassium: 3.8 mEq/L (ref 3.5–5.1)

## 2011-07-11 MED ORDER — ENOXAPARIN SODIUM 40 MG/0.4ML ~~LOC~~ SOLN
40.0000 mg | SUBCUTANEOUS | Status: DC
  Administered 2011-07-11 – 2011-07-17 (×7): 40 mg via SUBCUTANEOUS
  Filled 2011-07-11 (×8): qty 0.4

## 2011-07-11 MED ORDER — MORPHINE SULFATE 2 MG/ML IJ SOLN
1.0000 mg | INTRAMUSCULAR | Status: DC | PRN
  Administered 2011-07-11: 4 mg via INTRAVENOUS
  Administered 2011-07-11 (×2): 2 mg via INTRAVENOUS
  Administered 2011-07-11: 4 mg via INTRAVENOUS
  Administered 2011-07-11 – 2011-07-12 (×6): 2 mg via INTRAVENOUS
  Administered 2011-07-12 (×2): 4 mg via INTRAVENOUS
  Administered 2011-07-12: 1 mg via INTRAVENOUS
  Administered 2011-07-12 – 2011-07-13 (×9): 2 mg via INTRAVENOUS
  Administered 2011-07-14 (×2): 4 mg via INTRAVENOUS
  Administered 2011-07-14: 2 mg via INTRAVENOUS
  Administered 2011-07-14: 4 mg via INTRAVENOUS
  Administered 2011-07-14: 2 mg via INTRAVENOUS
  Administered 2011-07-14 – 2011-07-15 (×6): 4 mg via INTRAVENOUS
  Administered 2011-07-15: 2 mg via INTRAVENOUS
  Administered 2011-07-15 (×8): 4 mg via INTRAVENOUS
  Administered 2011-07-15: 2 mg via INTRAVENOUS
  Administered 2011-07-16 – 2011-07-17 (×10): 4 mg via INTRAVENOUS
  Administered 2011-07-17 (×3): 2 mg via INTRAVENOUS
  Administered 2011-07-17: 4 mg via INTRAVENOUS
  Administered 2011-07-17: 2 mg via INTRAVENOUS
  Administered 2011-07-17: 4 mg via INTRAVENOUS
  Administered 2011-07-17: 2 mg via INTRAVENOUS
  Administered 2011-07-17 – 2011-07-18 (×2): 4 mg via INTRAVENOUS
  Administered 2011-07-18 (×3): 2 mg via INTRAVENOUS
  Administered 2011-07-18: 4 mg via INTRAVENOUS
  Administered 2011-07-18 – 2011-07-19 (×3): 2 mg via INTRAVENOUS
  Administered 2011-07-19 (×4): 4 mg via INTRAVENOUS
  Filled 2011-07-11: qty 2
  Filled 2011-07-11: qty 1
  Filled 2011-07-11: qty 2
  Filled 2011-07-11 (×2): qty 1
  Filled 2011-07-11 (×2): qty 2
  Filled 2011-07-11: qty 1
  Filled 2011-07-11: qty 2
  Filled 2011-07-11: qty 1
  Filled 2011-07-11: qty 2
  Filled 2011-07-11 (×3): qty 1
  Filled 2011-07-11 (×2): qty 2
  Filled 2011-07-11 (×2): qty 1
  Filled 2011-07-11 (×2): qty 2
  Filled 2011-07-11 (×2): qty 1
  Filled 2011-07-11 (×3): qty 2
  Filled 2011-07-11 (×5): qty 1
  Filled 2011-07-11: qty 2
  Filled 2011-07-11: qty 1
  Filled 2011-07-11 (×2): qty 2
  Filled 2011-07-11: qty 1
  Filled 2011-07-11 (×8): qty 2
  Filled 2011-07-11 (×5): qty 1
  Filled 2011-07-11: qty 2
  Filled 2011-07-11: qty 1
  Filled 2011-07-11 (×2): qty 2
  Filled 2011-07-11 (×3): qty 1
  Filled 2011-07-11: qty 2
  Filled 2011-07-11: qty 1
  Filled 2011-07-11 (×8): qty 2
  Filled 2011-07-11: qty 1
  Filled 2011-07-11: qty 2
  Filled 2011-07-11: qty 1
  Filled 2011-07-11: qty 2
  Filled 2011-07-11: qty 1
  Filled 2011-07-11 (×2): qty 2
  Filled 2011-07-11: qty 1
  Filled 2011-07-11: qty 2

## 2011-07-11 NOTE — Progress Notes (Signed)
1 Day Post-Op  Subjective: TM 99.9, feels warmer now, she just had some ginger ale.  Asking repeatedly for more pain medicine. Also says pain in the Left flank isn't better wants [place] to figure it out.  Objective: Vital signs in last 24 hours: Temp:  [97.5 F (36.4 C)-99.9 F (37.7 C)] 99.9 F (37.7 C) (02/15 0500) Pulse Rate:  [69-90] 79  (02/15 0500) Resp:  [10-21] 18  (02/15 0500) BP: (114-152)/(72-99) 122/72 mmHg (02/15 0500) SpO2:  [97 %-100 %] 99 % (02/15 0500) Weight:  [178 lb (80.74 kg)] 178 lb (80.74 kg) (02/14 2344) Last BM Date: 07/09/11  Intake/Output from previous day: 02/14 0701 - 02/15 0700 In: 2900 [I.V.:2900] Out: 1675 [Urine:1650; Blood:25] Intake/Output this shift:    PE:  Alert, asking for pain meds.  Chest clear.  Foley is in, Abd:  Tender,  Distended, bowel sounds hyperactive.  Lab Results:   Basename 07/11/11 0347 07/10/11 1310  WBC 20.1* 14.0*  HGB 10.6* 11.9*  HCT 32.4* 36.1  PLT 204 216    BMET  Basename 07/11/11 0347 07/10/11 1310  NA 134* 135  K 3.8 4.0  CL 102 100  CO2 22 26  GLUCOSE 141* 95  BUN 5* 7  CREATININE 0.73 0.76  CALCIUM 8.5 9.5   PT/INR No results found for this basename: LABPROT:2,INR:2 in the last 72 hours   Studies/Results: Ct Abdomen Pelvis W Contrast  07/10/2011  *RADIOLOGY REPORT*  Clinical Data: Left flank pain for past week.  Nausea.  Alcohol and crack abuse.  CT ABDOMEN AND PELVIS WITH CONTRAST  Technique:  Multidetector CT imaging of the abdomen and pelvis was performed following the standard protocol during bolus administration of intravenous contrast.  Contrast: OMNIPAQUE IOHEXOL 300 MG/ML IV SOLN  Comparison: 12/30/2007.  Findings: Inflammation surrounds the cecum and appendix.  The appendix appears thickened.  This may represent result of appendicitis with inflammation of surrounding fat planes rather than primary cecal abnormality.  Clinical correlation recommended. The terminal ileum does not appear to  be involved. Surrounding small lymph nodes.  Complex bilateral ovarian cystic structures.  Etiology indeterminate.  Prominent cystic structures noted on the prior examination.  Correlation with elective pelvic ultrasound after acute episode has cleared may be considered.  Heterogeneous appearance of the uterus suggesting the presence of fibroids.  No free intraperitoneal air.  Lung bases clear.  Focal fatty sparing left lobe of the liver. Right lobe of the liver 1.1 cm hyperdense lesion (series 2 image 23) and dome of the liver 1 cm lesion (series 2 image 12) without change.  Etiology indeterminate.  No definitive findings of cirrhosis.  Spleen is not enlarged.  Portal vein and splenic vein are patent. No focal splenic, pancreatic or adrenal lesion. No focal renal mass or hydronephrosis.  Inferior to the left renal vein is a low density nonspecific 3.2 cm structure which is without change.  Degenerative changes lower lumbar spine.  Small sclerotic focus right ilium and lucent lesion left ilium without change.  IMPRESSION: Findings raise possibility of appendicitis as detailed above.  Remainder of findings as detailed above do not appear acute and may require follow-up.  Original Report Authenticated By: Fuller Canada, M.D.    Anti-infectives: Anti-infectives     Start     Dose/Rate Route Frequency Ordered Stop   07/11/11 1800   ertapenem (INVANZ) 1 g in sodium chloride 0.9 % 50 mL IVPB        1 g 100 mL/hr over 30  Minutes Intravenous Every 24 hours 07/10/11 2217     07/10/11 1730   ertapenem (INVANZ) 1 g in sodium chloride 0.9 % 50 mL IVPB  Status:  Discontinued        1 g 100 mL/hr over 30 Minutes Intravenous Every 24 hours 07/10/11 1649 07/10/11 2217         Current Facility-Administered Medications  Medication Dose Route Frequency Provider Last Rate Last Dose  . 0.9 %  sodium chloride infusion   Intravenous Once [name], MD 125 mL/hr at 07/10/11 1546    . 0.9 %  sodium chloride infusion    Intravenous Continuous Emelia Loron, MD 100 mL/hr at 07/11/11 [phone]    . acetaminophen (TYLENOL) tablet 650 mg  650 mg Oral Q6H PRN Emelia Loron, MD       Or  . acetaminophen (TYLENOL) suppository 650 mg  650 mg Rectal Q6H PRN Emelia Loron, MD      . albuterol (PROVENTIL HFA;VENTOLIN HFA) 108 (90 BASE) MCG/ACT inhaler 2 puff  2 puff Inhalation Q4H PRN Emelia Loron, MD      . ertapenem [facility]) 1 g in sodium chloride 0.9 % 50 mL IVPB  1 g Intravenous Q24H Emelia Loron, MD      . HYDROmorphone (DILAUDID) 1 MG/ML injection           . HYDROmorphone (DILAUDID) 1 MG/ML injection           . HYDROmorphone (DILAUDID) injection 1 mg  1 mg Intravenous Once [name], MD   1 mg at 07/10/11 1545  . morphine 2 MG/ML injection 2 mg  2 mg Intravenous Q2H PRN Emelia Loron, MD   2 mg at 07/11/11 1191  . morphine 2 MG/ML injection           . ondansetron (ZOFRAN) injection 4 mg  4 mg Intravenous Once [name], MD   4 mg at 07/10/11 1545  . ondansetron (ZOFRAN) injection 4 mg  4 mg Intravenous Q6H PRN Emelia Loron, MD      . pantoprazole (PROTONIX) injection 40 mg  40 mg Intravenous QHS Emelia Loron, MD      . DISCONTD: bupivacaine (MARCAINE) 0.25 % (with pres) injection    PRN Emelia Loron, MD   15 mL at 07/10/11 1931  . DISCONTD: dextrose 5 % and 0.9 % NaCl with KCl 20 mEq/L infusion   Intravenous Continuous Caleen Essex III, MD      . DISCONTD: ertapenem [facility]) 1 g in sodium chloride 0.9 % 50 mL IVPB  1 g Intravenous Q24H Caleen Essex III, MD   1 g at 07/10/11 1805  . DISCONTD: HYDROmorphone (DILAUDID) injection 0.25-0.5 mg  0.25-0.5 mg Intravenous Q5 min PRN Azell Der, MD   0.5 mg at 07/10/11 2058  . DISCONTD: lactated ringers irrigation solution    PRN Emelia Loron, MD   1,000 mL at 07/10/11 1840  . DISCONTD: morphine 4 MG/ML injection 4 mg  4 mg Intravenous Q1H PRN Caleen Essex III, MD      . DISCONTD: ondansetron (ZOFRAN) injection 4 mg  4 mg  Intravenous Q6H PRN Caleen Essex III, MD      . DISCONTD: pantoprazole (PROTONIX) injection 40 mg  40 mg Intravenous QHS Caleen Essex III, MD      . DISCONTD: promethazine (PHENERGAN) injection 6.25-12.5 mg  6.25-12.5 mg Intravenous Q15 min PRN Azell Der, MD       Facility-Administered Medications Ordered in Other Encounters  Medication Dose Route Frequency Provider Last Rate Last Dose  . iohexol (OMNIPAQUE) 300 MG/ML solution 100 mL  100 mL Intravenous Once PRN Medication Radiologist, MD   100 mL at 07/10/11 1131  . DISCONTD: dexamethasone (DECADRON) injection    PRN Joycie Peek, CRNA   10 mg at 07/10/11 1812  . DISCONTD: fentaNYL (SUBLIMAZE) injection    PRN Joycie Peek, CRNA   50 mcg at 07/10/11 1927  . DISCONTD: glycopyrrolate (ROBINUL) injection    PRN Joycie Peek, CRNA   0.6 mg at 07/10/11 1930  . DISCONTD: lactated ringers infusion    Continuous PRN Joycie Peek, CRNA      . DISCONTD: lidocaine (cardiac) 100 mg/22ml (XYLOCAINE) 20 MG/ML injection 2%    PRN Joycie Peek, CRNA   50 mg at 07/10/11 1804  . DISCONTD: midazolam (VERSED) 5 MG/5ML injection    PRN Joycie Peek, CRNA   2 mg at 07/10/11 1756  . DISCONTD: neostigmine (PROSTIGMINE) injection   Intravenous PRN Joycie Peek, CRNA   4 mg at 07/10/11 1930  . DISCONTD: ondansetron (ZOFRAN) injection    PRN Joycie Peek, CRNA   4 mg at 07/10/11 1812  . DISCONTD: propofol (DIPRIVAN) 10 mg/mL bolus    PRN Joycie Peek, CRNA   150 mg at 07/10/11 1804  . DISCONTD: rocuronium (ZEMURON) injection    PRN Joycie Peek, CRNA   30 mg at 07/10/11 1810  . DISCONTD: succinylcholine (ANECTINE) injection    PRN Joycie Peek, CRNA   100 mg at 07/10/11 1804    Assessment/Plan 1.Acute appendicitis with Appendiceal abscess; s/pLaparoscopic appendectomy 07/10/11 Dr. Emelia Loron; WBC up to 20,100. 2. History of recent treatment for UTI, ongoing left flank discomfort.  3. Asthma  4. Ongoing tobacco use.  5. History of polysubstance abuse; alcohol, tobacco,  marijuana, cocaine. None since 2009.  6. History of bilateral ovarian cyst masses, menorrhagia.  7. History of depression prior [facility] hospitalization  8. Prior history of transaminitis thought to be secondary to alcohol use. Negative screen for hepatitis. 9.BMI 28.8  Plan: I will up her pain medicine, leave foley, stick to clear liquids for now.  Plan to get the foley out in AM. 2/16.  Continue antibiotics, begin to mobilize. IS. Lovenox for DVT tonight. Labs in AM.    LOS: 1 day    Rose-Marie Hickling 07/11/2011

## 2011-07-12 ENCOUNTER — Inpatient Hospital Stay (HOSPITAL_COMMUNITY): Payer: Self-pay

## 2011-07-12 LAB — COMPREHENSIVE METABOLIC PANEL
AST: 9 U/L (ref 0–37)
Albumin: 2.5 g/dL — ABNORMAL LOW (ref 3.5–5.2)
BUN: 4 mg/dL — ABNORMAL LOW (ref 6–23)
Calcium: 8.4 mg/dL (ref 8.4–10.5)
Creatinine, Ser: 0.82 mg/dL (ref 0.50–1.10)
GFR calc non Af Amer: 85 mL/min — ABNORMAL LOW (ref >90)

## 2011-07-12 LAB — CBC
MCH: 28 pg (ref 26.0–34.0)
MCHC: 32.6 g/dL (ref 30.0–36.0)
MCV: 86 fL (ref 78.0–100.0)
Platelets: 196 10*3/uL (ref 150–400)
RDW: 16.4 % — ABNORMAL HIGH (ref 11.5–15.5)

## 2011-07-12 MED ORDER — IOHEXOL 300 MG/ML  SOLN
100.0000 mL | Freq: Once | INTRAMUSCULAR | Status: AC | PRN
  Administered 2011-07-12: 100 mL via INTRAVENOUS

## 2011-07-12 NOTE — Progress Notes (Addendum)
Attempt to insert NG tube in rt and lt nares per MD orders secondary to nausea and vomiting and patient was unable to tolerate the bedside procedure. Patient refused to allow for additional attempts at time. Will try again this evening.  Patient had large amount of vomitus and noted bld with attempts to insertion of NGT .

## 2011-07-12 NOTE — Progress Notes (Signed)
2 Days Post-Op  Subjective: She still has diffuse abdominal pain this is no better and no worse than yesterday. She notes mild nausea but has not vomited. No stools.  On Invanz IV.  WBC still 20,000.  Objective: Vital signs in last 24 hours: Temp:  [98.2 F (36.8 C)-99.9 F (37.7 C)] 99.9 F (37.7 C) (02/16 0616) Pulse Rate:  [90-108] 108  (02/16 0616) Resp:  [16-18] 18  (02/16 0616) BP: (93-118)/(59-79) 118/79 mmHg (02/16 0616) SpO2:  [90 %-99 %] 91 % (02/16 0616) Last BM Date: 07/09/11  Intake/Output from previous day: 02/15 0701 - 02/16 0700 In: 1320 [P.O.:720; I.V.:600] Out: 3500 [Urine:3500] Intake/Output this shift:    General appearance: awake, alert, oriented, cooperative. Appears to be in mild distress. GI: abdomen is somewhat distended and diffusely tender. No bowel sounds. Wounds are okay.  Lab Results:  Results for orders placed during the hospital encounter of 07/10/11 (from the past 24 hour(s))  CBC     Status: Abnormal   Collection Time   07/12/11  4:46 AM      Component Value Range   WBC 20.7 (*) 4.0 - 10.5 (K/uL)   RBC 3.57 (*) 3.87 - 5.11 (MIL/uL)   Hemoglobin 10.0 (*) 12.0 - 15.0 (g/dL)   HCT 16.1 (*) 09.6 - 46.0 (%)   MCV 86.0  78.0 - 100.0 (fL)   MCH 28.0  26.0 - 34.0 (pg)   MCHC 32.6  30.0 - 36.0 (g/dL)   RDW 04.5 (*) 40.9 - 15.5 (%)   Platelets 196  150 - 400 (K/uL)  COMPREHENSIVE METABOLIC PANEL     Status: Abnormal   Collection Time   07/12/11  4:46 AM      Component Value Range   Sodium 137  135 - 145 (mEq/L)   Potassium 3.5  3.5 - 5.1 (mEq/L)   Chloride 105  96 - 112 (mEq/L)   CO2 26  19 - 32 (mEq/L)   Glucose, Bld 133 (*) 70 - 99 (mg/dL)   BUN 4 (*) 6 - 23 (mg/dL)   Creatinine, Ser 8.11  0.50 - 1.10 (mg/dL)   Calcium 8.4  8.4 - 91.4 (mg/dL)   Total Protein 5.7 (*) 6.0 - 8.3 (g/dL)   Albumin 2.5 (*) 3.5 - 5.2 (g/dL)   AST 9  0 - 37 (U/L)   ALT <5  0 - 35 (U/L)   Alkaline Phosphatase 81  39 - 117 (U/L)   Total Bilirubin 0.2 (*) 0.3  - 1.2 (mg/dL)   GFR calc non Af Amer 85 (*) >90 (mL/min)   GFR calc Af Amer >90  >90 (mL/min)     Studies/Results: @RISRSLT24 @     . enoxaparin (LOVENOX) injection  40 mg Subcutaneous Q24H  . ertapenem (INVANZ) IV  1 g Intravenous Q24H  . pantoprazole (PROTONIX) IV  40 mg Intravenous QHS     Assessment/Plan: s/p Procedure(s): APPENDECTOMY LAPAROSCOPIC   1.Acute appendicitis with Appendiceal abscess; s/pLaparoscopic appendectomy 07/10/11 Dr. Emelia Loron; WBC up to 20,100. She may simply have an ileus. Leukocytosis is of some concern.  2. History of recent treatment for UTI, ongoing left flank discomfort.  3. Asthma  4. Ongoing tobacco use.  5. History of polysubstance abuse; alcohol, tobacco, marijuana, cocaine. None since 2009.  6. History of bilateral ovarian cyst masses, menorrhagia.  7. History of depression prior [facility] hospitalization  8. Prior history of transaminitis thought to be secondary to alcohol use. Negative screen for hepatitis.  9.BMI 28.8  Plan: CT  abdomen and pelvis today to make sure there hasn't been a postop complication. Continue Invanz. Continue IV fluid support. Do not advance diet.   Patient Active Hospital Problem List: No active hospital problems.   LOS: 2 days    [name] M 07/12/2011  . .prob

## 2011-07-12 NOTE — Progress Notes (Signed)
Pt with increased gastric distention, nausea & vomiting.  Dr Daphine Deutscher on call & notified.  Orders received. Chikita Dogan, [employer]

## 2011-07-12 NOTE — Progress Notes (Signed)
Attempted insertion of NGT per orders. Pt unable to tolerate. Next shift to try later.  Ivan Lacher, [employer]

## 2011-07-13 ENCOUNTER — Inpatient Hospital Stay (HOSPITAL_COMMUNITY): Payer: Self-pay

## 2011-07-13 LAB — BASIC METABOLIC PANEL
CO2: 26 mEq/L (ref 19–32)
Calcium: 8.8 mg/dL (ref 8.4–10.5)
Chloride: 104 mEq/L (ref 96–112)
Glucose, Bld: 116 mg/dL — ABNORMAL HIGH (ref 70–99)
Sodium: 137 mEq/L (ref 135–145)

## 2011-07-13 LAB — CBC
Hemoglobin: 10.4 g/dL — ABNORMAL LOW (ref 12.0–15.0)
MCH: 27.7 pg (ref 26.0–34.0)
MCV: 84.8 fL (ref 78.0–100.0)
RBC: 3.75 MIL/uL — ABNORMAL LOW (ref 3.87–5.11)
WBC: 24.5 10*3/uL — ABNORMAL HIGH (ref 4.0–10.5)

## 2011-07-13 MED ORDER — POTASSIUM CHLORIDE 10 MEQ/100ML IV SOLN
10.0000 meq | INTRAVENOUS | Status: AC
  Administered 2011-07-13 (×4): 10 meq via INTRAVENOUS
  Filled 2011-07-13 (×4): qty 100

## 2011-07-13 MED ORDER — POTASSIUM CHLORIDE IN NACL 40-0.9 MEQ/L-% IV SOLN
INTRAVENOUS | Status: DC
  Administered 2011-07-13 – 2011-07-14 (×2): via INTRAVENOUS
  Filled 2011-07-13 (×5): qty 1000

## 2011-07-13 MED ORDER — MENTHOL 3 MG MT LOZG: 1.0000 | LOZENGE | OROMUCOSAL | Status: DC | PRN

## 2011-07-13 MED ORDER — PHENOL 1.4 % MT LIQD
1.0000 | OROMUCOSAL | Status: DC | PRN
  Administered 2011-07-13: 1 via OROMUCOSAL
  Filled 2011-07-13: qty 177

## 2011-07-13 MED ORDER — SODIUM CHLORIDE 0.9 % IV SOLN: INTRAVENOUS | Status: DC

## 2011-07-13 MED ORDER — VITAMINS A & D EX OINT
TOPICAL_OINTMENT | CUTANEOUS | Status: AC
  Filled 2011-07-13: qty 5

## 2011-07-13 NOTE — Progress Notes (Addendum)
3 Days Post-Op  Subjective: Patient feels about the same. Noticed abdominal distention and pain. Had some vomiting yesterday. NG tube insertion unsuccessful after multiple attempts.  She has passed flatus twice but has had no stools.  CT scan yesterday shows postoperative changes with scattered fluid, but no abscess, no free air or evidence of perforation, and no evidence of obstruction. Adynamic ileus is noted.  WBC 24,500, remains elevated.potassium 3.2. No fever. Borderline tachycardia heart rate 100. Good urine output. Foley remains in.    Objective: Vital signs in last 24 hours: Temp:  [98.4 F (36.9 C)-99.6 F (37.6 C)] 99.6 F (37.6 C) (02/17 0648) Pulse Rate:  [99-118] 100  (02/17 0648) Resp:  [16-24] 24  (02/17 0648) BP: (116-135)/(79-87) 126/85 mmHg (02/17 0648) SpO2:  [78 %-95 %] 92 % (02/17 0648) Last BM Date: 07/09/11  Intake/Output from previous day: 02/16 0701 - 02/17 0700 In: 2480 [P.O.:120; I.V.:2350; IV Piggyback:10] Out: 1652 [Urine:1450; Emesis/NG output:202] Intake/Output this shift:    General appearance: alert. Cooperative. Mild distress. Not toxic. Resp: clear upper lung fields, egophony bases. GI: abdomen is distended and tympanitic. Mild diffuse tenderness. No bowel sounds. Wounds look fine.  Lab Results:  Results for orders placed during the hospital encounter of 07/10/11 (from the past 24 hour(s))  CBC     Status: Abnormal   Collection Time   07/13/11  4:25 AM      Component Value Range   WBC 24.5 (*) 4.0 - 10.5 (K/uL)   RBC 3.75 (*) 3.87 - 5.11 (MIL/uL)   Hemoglobin 10.4 (*) 12.0 - 15.0 (g/dL)   HCT 19.1 (*) 47.8 - 46.0 (%)   MCV 84.8  78.0 - 100.0 (fL)   MCH 27.7  26.0 - 34.0 (pg)   MCHC 32.7  30.0 - 36.0 (g/dL)   RDW 29.5 (*) 62.1 - 15.5 (%)   Platelets 247  150 - 400 (K/uL)  BASIC METABOLIC PANEL     Status: Abnormal   Collection Time   07/13/11  4:25 AM      Component Value Range   Sodium 137  135 - 145 (mEq/L)   Potassium 3.2 (*)  3.5 - 5.1 (mEq/L)   Chloride 104  96 - 112 (mEq/L)   CO2 26  19 - 32 (mEq/L)   Glucose, Bld 116 (*) 70 - 99 (mg/dL)   BUN 5 (*) 6 - 23 (mg/dL)   Creatinine, Ser 3.08  0.50 - 1.10 (mg/dL)   Calcium 8.8  8.4 - 65.7 (mg/dL)   GFR calc non Af Amer >90  >90 (mL/min)   GFR calc Af Amer >90  >90 (mL/min)     Studies/Results: @RISRSLT24 @     . enoxaparin (LOVENOX) injection  40 mg Subcutaneous Q24H  . ertapenem (INVANZ) IV  1 g Intravenous Q24H  . pantoprazole (PROTONIX) IV  40 mg Intravenous QHS     Assessment/Plan: s/p Procedure(s): APPENDECTOMY LAPAROSCOPIC  1.Acute appendicitis with Appendiceal abscess; s/pLaparoscopic appendectomy 07/10/11 Dr. Emelia Loron; WBC up to 24,500. She may simply have an ileus. CT scan does not show any surgical complication at this point.Leukocytosis is of some concern. For now I think the right course is to continue bowel rest, IV antibiotics, and hydration. If she vomits again we will have to place an NG tube. She is at risk for developing an abscess. Consider repeat CT in a few days.  2. History of recent treatment for UTI, ongoing left flank discomfort.  3. Asthma  4. Ongoing tobacco use.  5. History of polysubstance abuse; alcohol, tobacco, marijuana, cocaine. None since 2009.  6. History of bilateral ovarian cyst masses, menorrhagia.  7. History of depression prior [facility] hospitalization  8. Prior history of transaminitis thought to be secondary to alcohol use. Negative screen for hepatitis.  9.BMI 28.8 10. Hypokalemia. I have added potassium to her IV fluids and will give 4 runs of KCl.  Patient Active Hospital Problem List: No active hospital problems.   LOS: 3 days    Kseniya Grunden M 07/13/2011  . .prob

## 2011-07-13 NOTE — Procedures (Signed)
NG tube under fluoro R nares to pylorus, ok to use. No complication No blood loss. See complete dictation in [facility].

## 2011-07-13 NOTE — Plan of Care (Signed)
Problem: Phase II Progression Outcomes Goal: Return of bowel function (flatus, BM) IF ABDOMINAL SURGERY:  Outcome: Progressing NGT placed by radiology @ 1530 on 07/13/2011 Goal: Foley discontinued Outcome: Not Met (add Reason) Per MD order Goal: Tolerating diet Outcome: Not Met (add Reason) NPO

## 2011-07-14 LAB — COMPREHENSIVE METABOLIC PANEL
ALT: <5 U/L (ref 0–35)
AST: 8 U/L (ref 0–37)
Alkaline Phosphatase: 75 U/L (ref 39–117)
CO2: 25 mEq/L (ref 19–32)
Calcium: 8.5 mg/dL (ref 8.4–10.5)
Chloride: 107 mEq/L (ref 96–112)
GFR calc non Af Amer: >90 mL/min (ref >90)
Glucose, Bld: 98 mg/dL (ref 70–99)
Potassium: 3.9 mEq/L (ref 3.5–5.1)
Sodium: 139 mEq/L (ref 135–145)
Total Bilirubin: 0.2 mg/dL — ABNORMAL LOW (ref 0.3–1.2)

## 2011-07-14 LAB — CBC
Hemoglobin: 9.4 g/dL — ABNORMAL LOW (ref 12.0–15.0)
Platelets: 281 10*3/uL (ref 150–400)
RBC: 3.37 MIL/uL — ABNORMAL LOW (ref 3.87–5.11)
WBC: 17.3 10*3/uL — ABNORMAL HIGH (ref 4.0–10.5)

## 2011-07-14 MED ORDER — POTASSIUM CHLORIDE IN NACL 20-0.9 MEQ/L-% IV SOLN
INTRAVENOUS | Status: DC
  Administered 2011-07-14 – 2011-07-15 (×4): via INTRAVENOUS
  Filled 2011-07-14 (×6): qty 1000

## 2011-07-14 NOTE — Progress Notes (Signed)
Agree with above, expected ileus, wbc improving.

## 2011-07-14 NOTE — Progress Notes (Signed)
Patient ID: [name], female   DOB: 01-Aug-1965, 46 y.o.   MRN: [id number] 4 Days Post-Op  Subjective: Pt had a BM yesterday but also threw up at the same time.  No flatus since then.  She is not ambulating at all!    Objective: Vital signs in last 24 hours: Temp:  [97.7 F (36.5 C)-98.6 F (37 C)] 97.7 F (36.5 C) (02/18 0636) Pulse Rate:  [100-109] 101  (02/18 0636) Resp:  [18] 18  (02/18 0636) BP: (114-142)/(80-94) 142/88 mmHg (02/18 0636) SpO2:  [78 %-95 %] 91 % (02/18 0636) Last BM Date: 07/13/11  Intake/Output from previous day: 02/17 0701 - 02/18 0700 In: 2252.1 [I.V.:1652.1; NG/GT:50; IV Piggyback:550] Out: 1801 [Urine:900; Emesis/NG output:900; Stool:1] Intake/Output this shift: Total I/O In: 50 [NG/GT:50] Out: 150 [Emesis/NG output:150]  PE: Abd: soft, distended, -BS, tender diffusely, but appropriate.  NGT with bilious output Heart: regular Lungs: CTAB  Lab Results:   Basename 07/14/11 0404 07/13/11 0425  WBC 17.3* 24.5*  HGB 9.4* 10.4*  HCT 29.0* 31.8*  PLT 281 247   BMET  Basename 07/13/11 0425 07/12/11 0446  NA 137 137  K 3.2* 3.5  CL 104 105  CO2 26 26  GLUCOSE 116* 133*  BUN 5* 4*  CREATININE 0.73 0.82  CALCIUM 8.8 8.4   PT/INR No results found for this basename: LABPROT:2,INR:2 in the last 72 hours   Studies/Results: Ct Abdomen Pelvis W Contrast  07/12/2011  *RADIOLOGY REPORT*  Clinical Data: Status post cholecystectomy 07/10/2011.  Diffuse abdominal pain.  Nausea without vomiting.  No bowel movements. Elevated white blood cell count.  Question peritonitis after appendectomy.  CT ABDOMEN AND PELVIS WITH CONTRAST  Technique:  Multidetector CT imaging of the abdomen and pelvis was performed following the standard protocol during bolus administration of intravenous contrast.  Contrast: OMNIPAQUE IOHEXOL 300 MG/ML IV SOLN  Comparison: 07/10/2011  Findings: Probable bullous disease at the anterior right lung base on image 2.  Airspace  disease within the dependent right middle and right lower lobes.  Dense consolidation in the left lung base and dependent left upper lobe.  Mild cardiomegaly with small bilateral pleural effusions.  Focal steatosis adjacent the falciform ligament.  A normal spleen. Normal stomach.  The descending duodenum is mildly prominent, at 4.1 cm on image 31.  Transverse duodenum normal in caliber.  Normal pancreas.  Vicarious excretion of contrast by the gallbladder. Normal biliary tract, adrenal glands, kidneys.  No retroperitoneal or retrocrural adenopathy.  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  Surgical sutures anterior to the descending/sigmoid junction on image 80.  Colon is normal in caliber.  There is underdistension with a suggestion of wall thickening at the cecum in the region of the ileocecal valve.  Example image 62 of series 2.  Surgical sutures are identified just inferior to this, consistent with the history of appendectomy.  Proximal small bowel loops measure up to 3.3 cm.  Gradual transition to normal caliber distal small bowel.  No focal transition point identified.  No pneumatosis.  There is a small volume perihepatic ascites with mild thickening / enhancement of the peritoneal surface.  Example image 11 of series 2.  Multiple areas of right greater than left mesenteric edema. This is centered in the region of the surgical change.  More focal edema and ill-defined fluid is identified in the right lower quadrant image 66.  No drainable abscess identified.  Fluid density  on image 80 is favored to be layering on the bladder dome (which is presumably collapsed around the Foley catheter.)  No significant enhancement in this area.  There is trace cul-de-sac fluid.  Normal uterus and adnexa for age.  Scattered bone islands within the pelvis.  IMPRESSION:  1.  Surgical changes of appendectomy.  Ill-defined postoperative edema/fluid without evidence  of local abscess. 2.  Wall thickening just cephalad surgical sutures in the region of the terminal ileum/cecum.  Favor postoperative edema and distention.  Focal colitis cannot be excluded. 3.  Small volume perihepatic ascites with mild peritoneal thickening.  Infected ascites cannot be excluded. 4.  Small bilateral pleural effusions with bibasilar atelectasis or infection.  Especially at the left lung base, infection is favored. 5.  Postoperative adynamic ileus without focal transition to suggest obstruction. 6.  Ill-defined fluid within the pelvis, layering dependently upon a collapsed urinary bladder.  No enhancement or gas within this fluid to suggest abscess. 7.  If the patient does not improve, follow-up CT should be considered to exclude developing abscess within either the perihepatic space or anterior pelvis.  Original Report Authenticated By: Consuello Bossier, M.D.   Dg Intro Long Gi Tube  07/13/2011  *RADIOLOGY REPORT*  Clinical Data: Nasogastric tube placement.  INTRO LONG GI TUBE  The fluoroscopic time:  0.3 minutes  Comparison: 07/12/2011.  Findings: Nasogastric tube placed by Dr.Hassel.  Two images submitted for review after the procedure. Nasogastric tube curled within the stomach with the tip at the expected level of the gastric pylorus.  IMPRESSION: Nasogastric tube tip at the expected level of the gastric pylorus.  Original Report Authenticated By: Fuller Canada, M.D.    Anti-infectives: Anti-infectives     Start     Dose/Rate Route Frequency Ordered Stop   07/11/11 1800   ertapenem (INVANZ) 1 g in sodium chloride 0.9 % 50 mL IVPB        1 g 100 mL/hr over 30 Minutes Intravenous Every 24 hours 07/10/11 2217     07/10/11 1730   ertapenem (INVANZ) 1 g in sodium chloride 0.9 % 50 mL IVPB  Status:  Discontinued        1 g 100 mL/hr over 30 Minutes Intravenous Every 24 hours 07/10/11 1649 07/10/11 2217           Assessment/Plan  1. perf appy, s/p lap appy 2. Post-op ileus 3.  Leukocytosis, improving  Plan: 1. Pt must get up and ambulate in the halls at least TID.  This will help with bowel function. 2. Cont NGT for now 3. Allow ice chips 4. Cont abx therapy 5. Follow CBC, WBCs improving though.    LOS: 4 days    Zakiyah Diop E 07/14/2011

## 2011-07-15 LAB — CBC
Hemoglobin: 8.7 g/dL — ABNORMAL LOW (ref 12.0–15.0)
MCH: 27.8 pg (ref 26.0–34.0)
MCHC: 32.3 g/dL (ref 30.0–36.0)
RDW: 16.3 % — ABNORMAL HIGH (ref 11.5–15.5)

## 2011-07-15 LAB — BASIC METABOLIC PANEL
Calcium: 8.3 mg/dL — ABNORMAL LOW (ref 8.4–10.5)
GFR calc Af Amer: >90 mL/min (ref >90)
GFR calc non Af Amer: >90 mL/min (ref >90)
Glucose, Bld: 110 mg/dL — ABNORMAL HIGH (ref 70–99)
Sodium: 137 mEq/L (ref 135–145)

## 2011-07-15 MED ORDER — KETOROLAC TROMETHAMINE 15 MG/ML IJ SOLN
INTRAMUSCULAR | Status: AC
  Administered 2011-07-15: 15 mg via INTRAVENOUS
  Filled 2011-07-15: qty 1

## 2011-07-15 MED ORDER — KCL IN DEXTROSE-NACL 20-5-0.45 MEQ/L-%-% IV SOLN
INTRAVENOUS | Status: DC
  Administered 2011-07-15 (×2): via INTRAVENOUS
  Administered 2011-07-16: 20 mL via INTRAVENOUS
  Administered 2011-07-17: 20:00:00 via INTRAVENOUS
  Administered 2011-07-17: 50 mL/h via INTRAVENOUS
  Administered 2011-07-18 – 2011-07-19 (×4): via INTRAVENOUS
  Administered 2011-07-20: 1000 mL via INTRAVENOUS
  Administered 2011-07-21 (×2): via INTRAVENOUS
  Filled 2011-07-15 (×15): qty 1000

## 2011-07-15 MED ORDER — KETOROLAC TROMETHAMINE 15 MG/ML IJ SOLN
15.0000 mg | Freq: Four times a day (QID) | INTRAMUSCULAR | Status: AC | PRN
Start: 2011-07-15 — End: 2011-07-17
  Administered 2011-07-15 – 2011-07-16 (×3): 15 mg via INTRAVENOUS
  Filled 2011-07-15 (×3): qty 1

## 2011-07-15 MED ORDER — DEXTROSE-NACL 5-0.45 % IV SOLN: INTRAVENOUS | Status: DC

## 2011-07-15 NOTE — Progress Notes (Signed)
She has passed flatus and is ambulating, wbc improving, ab appropr tender, agree with above note, path showed appendicitis.

## 2011-07-15 NOTE — Progress Notes (Signed)
Patient ID: [name], female   DOB: [birth year], 46 y.o.   MRN: [id number] 5 Days Post-Op  Subjective: Pt c/o some diffuse abdominal pain.  Questioned if she had a liver infection like she did a year ago.  She passed flatus several times yesterday.  Objective: Vital signs in last 24 hours: Temp:  [98 F (36.7 C)-98.3 F (36.8 C)] 98 F (36.7 C) (02/19 0443) Pulse Rate:  [84-101] 84  (02/19 0443) Resp:  [18] 18  (02/19 0443) BP: (114-136)/(84-96) 136/96 mmHg (02/19 0443) SpO2:  [91 %-96 %] 94 % (02/19 0443) Last BM Date: 07/13/11  Intake/Output from previous day: 02/18 0701 - 02/19 0700 In: 4172.1 [P.O.:1040; I.V.:3002.1; NG/GT:80; IV Piggyback:50] Out: 2175 [Urine:425; Emesis/NG output:1750] Intake/Output this shift:    PE: Abd: softer, less distended, +BS, mildly tender, incisions c/d/i  Lab Results:   Basename 07/15/11 0358 07/14/11 0404  WBC 12.5* 17.3*  HGB 8.7* 9.4*  HCT 26.9* 29.0*  PLT 305 281   BMET  Basename 07/15/11 0358 07/14/11 0755  NA 137 139  K 3.6 3.9  CL 107 107  CO2 27 25  GLUCOSE 110* 98  BUN 6 9  CREATININE 0.72 0.66  CALCIUM 8.3* 8.5   PT/INR No results found for this basename: LABPROT:2,INR:2 in the last 72 hours   Studies/Results: Dg Intro Long Gi Tube  07/13/2011  *RADIOLOGY REPORT*  Clinical Data: Nasogastric tube placement.  INTRO LONG GI TUBE  The fluoroscopic time:  0.3 minutes  Comparison: 07/12/2011.  Findings: Nasogastric tube placed by Dr.Hassel.  Two images submitted for review after the procedure. Nasogastric tube curled within the stomach with the tip at the expected level of the gastric pylorus.  IMPRESSION: Nasogastric tube tip at the expected level of the gastric pylorus.  Original Report Authenticated By: Fuller Canada, M.D.    Anti-infectives: Anti-infectives     Start     Dose/Rate Route Frequency Ordered Stop   07/11/11 1800   ertapenem (INVANZ) 1 g in sodium chloride 0.9 % 50 mL IVPB        1 g 100 mL/hr  over 30 Minutes Intravenous Every 24 hours 07/10/11 2217     07/10/11 1730   ertapenem (INVANZ) 1 g in sodium chloride 0.9 % 50 mL IVPB  Status:  Discontinued        1 g 100 mL/hr over 30 Minutes Intravenous Every 24 hours 07/10/11 1649 07/10/11 2217           Assessment/Plan  1. S/p lap appy for perf appy 2. Post-op ileus, resolving  Plan: 1. Will clamp NGT today.  She ate a lot of ice chips yesterday which i think explains her increase in NGT output.  She currently is passing flatus and has had a BM. 2. Cont to ambulate in the halls. 3. If tolerates tube clamping today, will plan on d/c NGT tomorrow. 4. WBC resolving.   LOS: 5 days    Cariann Kinnamon E 07/15/2011

## 2011-07-15 NOTE — Progress Notes (Signed)
07/15/11 0400 Nursing  Patient reports passing flatus x 2 times tonight. Eustace Moore RN

## 2011-07-15 NOTE — Progress Notes (Signed)
Notified by tech, Cyndy Freeze that patient's BP was 157/123 after going to the bathroom. On call MD, Dr. Sid Falcon paged  With orders to give Toradol 15mg  IV now and to repeat q6h PRN pain up to 4 doses. Will continue to monitor patient.

## 2011-07-16 LAB — COMPREHENSIVE METABOLIC PANEL
ALT: 5 U/L (ref 0–35)
AST: 18 U/L (ref 0–37)
CO2: 19 mEq/L (ref 19–32)
Calcium: 8.2 mg/dL — ABNORMAL LOW (ref 8.4–10.5)
Potassium: 4 mEq/L (ref 3.5–5.1)
Sodium: 136 mEq/L (ref 135–145)
Total Protein: 5.3 g/dL — ABNORMAL LOW (ref 6.0–8.3)

## 2011-07-16 LAB — CBC
MCH: 28.2 pg (ref 26.0–34.0)
MCHC: 33.1 g/dL (ref 30.0–36.0)
Platelets: 305 10*3/uL (ref 150–400)
RBC: 3.12 MIL/uL — ABNORMAL LOW (ref 3.87–5.11)

## 2011-07-16 MED ORDER — ZOLPIDEM TARTRATE 5 MG PO TABS
5.0000 mg | ORAL_TABLET | Freq: Every evening | ORAL | Status: DC | PRN
  Administered 2011-07-16 – 2011-07-21 (×4): 5 mg via ORAL
  Filled 2011-07-16 (×6): qty 1

## 2011-07-16 NOTE — Progress Notes (Signed)
Patient ID: [name], female   DOB: 12/07/1965, 46 y.o.   MRN: [id number] 6 Days Post-Op  Subjective: Pt feels well today.  No nausea with NGT clamped.  +flatus.  Objective: Vital signs in last 24 hours: Temp:  [97.3 F (36.3 C)-98.6 F (37 C)] 97.3 F (36.3 C) (02/20 0628) Pulse Rate:  [76-103] 76  (02/20 0628) Resp:  [18-20] 20  (02/20 0628) BP: (118-157)/(80-123) 130/84 mmHg (02/20 0628) SpO2:  [78 %-99 %] 98 % (02/20 0628) Last BM Date: 07/13/11  Intake/Output from previous day: 02/19 0701 - 02/20 0700 In: 3149.6 [P.O.:260; I.V.:2589.6; NG/GT:250; IV Piggyback:50] Out: 3350 [Urine:3325; Emesis/NG output:25] Intake/Output this shift:    PE: Abd: soft, minimally tender, incisions c/d/i, +BS, still with some mild distention  Lab Results:   Basename 07/16/11 0425 07/15/11 0358  WBC 10.6* 12.5*  HGB 8.8* 8.7*  HCT 26.6* 26.9*  PLT 305 305   BMET  Basename 07/16/11 0425 07/15/11 0358  NA 136 137  K 4.0 3.6  CL 105 107  CO2 19 27  GLUCOSE 113* 110*  BUN 4* 6  CREATININE 0.59 0.72  CALCIUM 8.2* 8.3*   PT/INR No results found for this basename: LABPROT:2,INR:2 in the last 72 hours   Studies/Results: No results found.  Anti-infectives: Anti-infectives     Start     Dose/Rate Route Frequency Ordered Stop   07/11/11 1800   ertapenem (INVANZ) 1 g in sodium chloride 0.9 % 50 mL IVPB        1 g 100 mL/hr over 30 Minutes Intravenous Every 24 hours 07/10/11 2217     07/10/11 1730   ertapenem (INVANZ) 1 g in sodium chloride 0.9 % 50 mL IVPB  Status:  Discontinued        1 g 100 mL/hr over 30 Minutes Intravenous Every 24 hours 07/10/11 1649 07/10/11 2217           Assessment/Plan  1. S/p lap appy 2. Leukocytosis, resolved  Plan: 1. D/c NGT, start clears 2. Decrease IVFs 3. Cont to ambulate 4. POD# 6, on Invanz, consider switch to PO abx?   LOS: 6 days    [name] E 07/16/2011

## 2011-07-16 NOTE — Progress Notes (Signed)
Agree with above, continue invanz

## 2011-07-17 LAB — CBC
HCT: 28.3 % — ABNORMAL LOW (ref 36.0–46.0)
MCH: 27.8 pg (ref 26.0–34.0)
MCHC: 32.9 g/dL (ref 30.0–36.0)
MCV: 84.7 fL (ref 78.0–100.0)
Platelets: 432 10*3/uL — ABNORMAL HIGH (ref 150–400)
RDW: 16 % — ABNORMAL HIGH (ref 11.5–15.5)
WBC: 11 10*3/uL — ABNORMAL HIGH (ref 4.0–10.5)

## 2011-07-17 MED ORDER — SODIUM CHLORIDE 0.9 % IV SOLN
1.0000 g | INTRAVENOUS | Status: DC
  Administered 2011-07-17 – 2011-07-20 (×4): 1 g via INTRAVENOUS
  Filled 2011-07-17 (×5): qty 1

## 2011-07-17 MED ORDER — AMOXICILLIN-POT CLAVULANATE 875-125 MG PO TABS
1.0000 | ORAL_TABLET | Freq: Two times a day (BID) | ORAL | Status: DC
  Administered 2011-07-17: 1 via ORAL
  Filled 2011-07-17 (×2): qty 1

## 2011-07-17 NOTE — Progress Notes (Signed)
Appears to have ileus but still passing some flatus, oob, will check wbc, keep on iv abx for now.  If no better tomorrow will consider ct as this will be about 7 days postop

## 2011-07-17 NOTE — Progress Notes (Signed)
Patient ID: [name], female   DOB: [birth year], 46 y.o.   MRN: [id number] 7 Days Post-Op  Subjective: Pt c/o some vaginal bleeding.  Said she had her menses already, but was having some extra bleeding today with some worsening right lower quadrant abd pain.  Not very hungry, feels bloated.  Drinking mostly just the coffee on her tray.  -BM, + flatus  Objective: Vital signs in last 24 hours: Temp:  [98.7 F (37.1 C)-99.8 F (37.7 C)] 99.8 F (37.7 C) (02/21 0530) Pulse Rate:  [20-91] 20  (02/21 0530) Resp:  [18-20] 20  (02/21 0530) BP: (114-143)/(78-94) 143/94 mmHg (02/21 0530) SpO2:  [95 %-100 %] 95 % (02/21 0530) Last BM Date: 07/13/11  Intake/Output from previous day: 02/20 0701 - 02/21 0700 In: 1650.7 [P.O.:900; I.V.:700.7; IV Piggyback:50] Out: 3050 [Urine:3050] Intake/Output this shift: Total I/O In: 240 [P.O.:240] Out: -   PE: Abd: soft, increase in abd distention today.  Tender throughout but worse in RLQ, incisions c/d/i.  Few BS Heart: regular Lungs: CTAB  Lab Results:   Basename 07/16/11 0425 07/15/11 0358  WBC 10.6* 12.5*  HGB 8.8* 8.7*  HCT 26.6* 26.9*  PLT 305 305   BMET  Basename 07/16/11 0425 07/15/11 0358  NA 136 137  K 4.0 3.6  CL 105 107  CO2 19 27  GLUCOSE 113* 110*  BUN 4* 6  CREATININE 0.59 0.72  CALCIUM 8.2* 8.3*   PT/INR No results found for this basename: LABPROT:2,INR:2 in the last 72 hours   Studies/Results: No results found.  Anti-infectives: Anti-infectives     Start     Dose/Rate Route Frequency Ordered Stop   07/11/11 1800   ertapenem (INVANZ) 1 g in sodium chloride 0.9 % 50 mL IVPB        1 g 100 mL/hr over 30 Minutes Intravenous Every 24 hours 07/10/11 2217     07/10/11 1730   ertapenem (INVANZ) 1 g in sodium chloride 0.9 % 50 mL IVPB  Status:  Discontinued        1 g 100 mL/hr over 30 Minutes Intravenous Every 24 hours 07/10/11 1649 07/10/11 2217           Assessment/Plan  1. perf appy, s/p lap appy 2.  Post-op ileus, beginning to resolve  Plan: 1. Will check some labs today to make sure WBC not going back up. 2. Will switch abx to po today  3. Suspect some of her pain may be menstrual cramping, but have to be concerned she may develop a post op collection since she has a perforated appendix.  Her CT scan this weekend was negative.  Will follow 4. Patient not eating much, will not adv diet today.  LOS: 7 days    Tu Bayle E 07/17/2011

## 2011-07-18 ENCOUNTER — Inpatient Hospital Stay (HOSPITAL_COMMUNITY): Payer: Self-pay

## 2011-07-18 LAB — CBC
Hemoglobin: 9.2 g/dL — ABNORMAL LOW (ref 12.0–15.0)
MCH: 27.5 pg (ref 26.0–34.0)
MCHC: 33.1 g/dL (ref 30.0–36.0)
RDW: 16.1 % — ABNORMAL HIGH (ref 11.5–15.5)

## 2011-07-18 MED ORDER — FENTANYL CITRATE 0.05 MG/ML IJ SOLN
INTRAMUSCULAR | Status: AC | PRN
  Administered 2011-07-18: 200 ug via INTRAVENOUS

## 2011-07-18 MED ORDER — MIDAZOLAM HCL 5 MG/5ML IJ SOLN
INTRAMUSCULAR | Status: AC | PRN
  Administered 2011-07-18: 2 mg via INTRAVENOUS

## 2011-07-18 MED ORDER — ENOXAPARIN SODIUM 40 MG/0.4ML ~~LOC~~ SOLN
40.0000 mg | SUBCUTANEOUS | Status: DC
  Administered 2011-07-18 – 2011-07-21 (×4): 40 mg via SUBCUTANEOUS
  Filled 2011-07-18 (×6): qty 0.4

## 2011-07-18 MED ORDER — IOHEXOL 300 MG/ML  SOLN
100.0000 mL | Freq: Once | INTRAMUSCULAR | Status: AC | PRN
  Administered 2011-07-18: 100 mL via INTRAVENOUS

## 2011-07-18 NOTE — Progress Notes (Signed)
She has worse ileus today, more pain, increased wbc.  She is certainly at risk for abscess, will make npo for now, cont iv abx and follow up after ct scan.

## 2011-07-18 NOTE — Progress Notes (Signed)
Patient ID: [name], female   DOB: 03-24-1966, 46 y.o.   MRN: [id number] 8 Days Post-Op  Subjective: Pt feels a little worse today.  Still having RLQ pain.  Decrease in flatus.  Still on clears  Objective: Vital signs in last 24 hours: Temp:  [98.7 F (37.1 C)-99.5 F (37.5 C)] 98.8 F (37.1 C) (02/22 0644) Pulse Rate:  [84-93] 84  (02/22 0644) Resp:  [18-20] 20  (02/22 0644) BP: (136-146)/(87-99) 136/87 mmHg (02/22 0644) SpO2:  [91 %-98 %] 91 % (02/22 0644) Last BM Date: 07/13/11  Intake/Output from previous day: 02/21 0701 - 02/22 0700 In: 2393.5 [P.O.:1280; I.V.:1113.5] Out: 4025 [Urine:4025] Intake/Output this shift:    PE: Abd: soft, tender, specifically in the RLQ, decrease BS, distended, incisions c/d/i Heart: regular Lungs: CTAB  Lab Results:   Basename 07/18/11 0420 07/17/11 1000  WBC 12.0* 11.0*  HGB 9.2* 9.3*  HCT 27.8* 28.3*  PLT 502* 432*   BMET  Basename 07/16/11 0425  NA 136  K 4.0  CL 105  CO2 19  GLUCOSE 113*  BUN 4*  CREATININE 0.59  CALCIUM 8.2*   PT/INR No results found for this basename: LABPROT:2,INR:2 in the last 72 hours   Studies/Results: No results found.  Anti-infectives: Anti-infectives     Start     Dose/Rate Route Frequency Ordered Stop   07/17/11 1300   ertapenem (INVANZ) 1 g in sodium chloride 0.9 % 50 mL IVPB        1 g 100 mL/hr over 30 Minutes Intravenous Every 24 hours 07/17/11 1217     07/17/11 1000   amoxicillin-clavulanate (AUGMENTIN) 875-125 MG per tablet 1 tablet  Status:  Discontinued        1 tablet Oral Every 12 hours 07/17/11 0859 07/17/11 1217   07/11/11 1800   ertapenem (INVANZ) 1 g in sodium chloride 0.9 % 50 mL IVPB  Status:  Discontinued        1 g 100 mL/hr over 30 Minutes Intravenous Every 24 hours 07/10/11 2217 07/17/11 0859   07/10/11 1730   ertapenem (INVANZ) 1 g in sodium chloride 0.9 % 50 mL IVPB  Status:  Discontinued        1 g 100 mL/hr over 30 Minutes Intravenous Every 24 hours  07/10/11 1649 07/10/11 2217           Assessment/Plan  1. perf appy, s/p lap appy 2. Increasing WBCs again  Plan: 1. Patient feeling worse with worsening RLQ and increasing WBCs 8 days post op.  Will order a CT scan to rule out abdominal abscess. 2. Continue augmentin 3. Cont clear liquids for now 4. Keep ambulating   LOS: 8 days    [name] E 07/18/2011

## 2011-07-18 NOTE — Procedures (Signed)
Ultrasound guided aspiration of fluid collection in RLQ.  60 ml of yellow serous fluid was aspirated (c/w ascites).  Majority of fluid removed.  No immediate complication.

## 2011-07-18 NOTE — Progress Notes (Signed)
INITIAL ADULT NUTRITION ASSESSMENT Date: 07/18/2011   Time: 2:19 PM Reason for Assessment: consult  ASSESSMENT: Female 46 y.o.  Dx: Acute appendicitis with perforation and peritoneal abscess  Hx:  Past Medical History  Diagnosis Date  . Depression     Follows with [facility], history of voluntary admission to [facility].  History of suisidal ideation with drug od (50 pills of ibuprofen).   . Bronchial asthma   . Tobacco abuse   . History of cocaine abuse     Quit in 2009  . Marijuana abuse     Hx of, quit in 2009  . Alcohol abuse     Hx of, quit in 2009  . Transaminitis     Considered to be secondary to alchol use.   . Adnexal mass 2006    Bilateral ovarian cystic masses- recomended GYN FU.   . Menorrhagia 2006    Endometiral Biopsy- DEGENERATING SECRETORY-TYPE ENDOMETRIUM  . Ankle fracture     Bimalleolar sp closed reduction under floroscopy.   . Normocytic anemia    Past Surgical History  Procedure Date  . Close reduction of bimalleolar ankle fracture     Related Meds:  Scheduled Meds:   . enoxaparin (LOVENOX) injection  40 mg Subcutaneous Q24H  . ertapenem  1 g Intravenous Q24H  . pantoprazole (PROTONIX) IV  40 mg Intravenous QHS  . DISCONTD: enoxaparin (LOVENOX) injection  40 mg Subcutaneous Q24H   Continuous Infusions:   . dextrose 5 % and 0.45 % NaCl with KCl 20 mEq/L 100 mL/hr at 07/18/11 1305   PRN Meds:.acetaminophen, acetaminophen, albuterol, iohexol, ketorolac, morphine, ondansetron, phenol, zolpidem   Ht: 5\' 6"  (167.6 cm)  Wt: 178 lb (80.74 kg)  Ideal Wt: 130 lbs % Ideal Wt: 136%  Usual Wt: 175 lbs % Usual Wt: 100%  Body mass index is 28.73 kg/(m^2).  Food/Nutrition Related Hx:  Pain with eating PTA  Labs:  CMP     Component Value Date/Time   NA 136 07/16/2011 0425   K 4.0 07/16/2011 0425   CL 105 07/16/2011 0425   CO2 19 07/16/2011 0425   GLUCOSE 113* 07/16/2011 0425   BUN 4* 07/16/2011 0425   CREATININE 0.59 07/16/2011 0425   CALCIUM 8.2*  07/16/2011 0425   PROT 5.3* 07/16/2011 0425   ALBUMIN 2.0* 07/16/2011 0425   AST 18 07/16/2011 0425   ALT 5 07/16/2011 0425   ALKPHOS 69 07/16/2011 0425   BILITOT 0.1* 07/16/2011 0425   GFRNONAA >90 07/16/2011 0425   GFRAA >90 07/16/2011 0425    CBC    Component Value Date/Time   WBC 12.0* 07/18/2011 0420   RBC 3.35* 07/18/2011 0420   HGB 9.2* 07/18/2011 0420   HCT 27.8* 07/18/2011 0420   PLT 502* 07/18/2011 0420   MCV 83.0 07/18/2011 0420   MCH 27.5 07/18/2011 0420   MCHC 33.1 07/18/2011 0420   RDW 16.1* 07/18/2011 0420   LYMPHSABS 4.1* 07/10/2011 1310   MONOABS 1.2* 07/10/2011 1310   EOSABS 0.2 07/10/2011 1310   BASOSABS 0.0 07/10/2011 1310    Intake: NPO Output:   Intake/Output Summary (Last 24 hours) at 07/18/11 1423 Last data filed at 07/18/11 0700  Gross per 24 hour  Intake 1483.5 ml  Output   2375 ml  Net -891.5 ml    Diet Order: NPO  Supplements/Tube Feeding: none at this time  IVF:    dextrose 5 % and 0.45 % NaCl with KCl 20 mEq/L Last Rate: 100 mL/hr at 07/18/11 1305  Estimated Nutritional Needs:   Kcal: 2000-2250 kcal Protein: 80-96g Fluid: >2.4 L/day  Pt admitted with abdominal pain, s/p appendectomy.  She has continued with leukocytosis and ileus post-op.  Percutaneous drainage planned in IR this afternoon.  Abdomen noted to be distended and few BS present.  Pt has been NPO/clear liquids x8 days.  NUTRITION DIAGNOSIS: -Inadequate oral intake (NI-2.1).  Status: Ongoing  RELATED TO: abdominal pain  AS EVIDENCE BY: NPO/clear liquids for 8 days, unable to advance diet  MONITORING/EVALUATION(Goals): 1.  Nutrition support; if pt unable to advance diet within 48-72 hrs  EDUCATION NEEDS: -No education needs identified at this time  INTERVENTION: 1.  Parenteral nutrition; if appropriate per MD.  Pt with decreased flatus, RLQ pain r/t ileus.  Has been NPO/clear liquids x8 days.  S/p percutaneous drainage. 2.  Modify diet; advance as tolerated and per MD  discretion.  Initiate clear liquid supplement for added protein if pt able to resume clear liquids.  Dietitian #: [id number]  DOCUMENTATION CODES Per approved criteria  -Not Applicable    Loyce Dys [facility] 07/18/2011, 2:19 PM

## 2011-07-18 NOTE — Progress Notes (Signed)
Patient returned from IR no diet order, dr Ezzard Standing paged with order received, restart clear liquid

## 2011-07-18 NOTE — H&P (Signed)
  Patient s/p appendectomy; had continued leukocytosis and ileus - found to have RLQ abscess today on CT imaging.  General Surgery has asked [place] to perform percutaneous drainage of this collection.  I spoke with patient and her mother in detail regarding percutaneous drainage details, benefits and risks including but not limited to organ damage, complications with moderate sedation and bleeding with their apparent understanding.  Written consent obtained.  Physical examination reveals her heart with RRR without evidence of murmurs, rubs or gallops,  Lungs are CTA bilaterally.  Her abdomen is tight, distended and few BS.  Labs appear to be WNL to proceed later today after NPO 4 hours post oral contrast for CT scan.

## 2011-07-19 LAB — CBC
MCH: 27.1 pg (ref 26.0–34.0)
Platelets: 631 10*3/uL — ABNORMAL HIGH (ref 150–400)
RBC: 3.47 MIL/uL — ABNORMAL LOW (ref 3.87–5.11)
WBC: 11.5 10*3/uL — ABNORMAL HIGH (ref 4.0–10.5)

## 2011-07-19 MED ORDER — MORPHINE SULFATE 2 MG/ML IJ SOLN
2.0000 mg | INTRAMUSCULAR | Status: DC | PRN
  Administered 2011-07-19 (×4): 6 mg via INTRAVENOUS
  Administered 2011-07-19: 4 mg via INTRAVENOUS
  Administered 2011-07-19: 6 mg via INTRAVENOUS
  Administered 2011-07-20 – 2011-07-21 (×7): 4 mg via INTRAVENOUS
  Administered 2011-07-21: 2 mg via INTRAVENOUS
  Administered 2011-07-21: 4 mg via INTRAVENOUS
  Administered 2011-07-21: 2 mg via INTRAVENOUS
  Administered 2011-07-21: 4 mg via INTRAVENOUS
  Filled 2011-07-19: qty 2
  Filled 2011-07-19: qty 3
  Filled 2011-07-19: qty 2
  Filled 2011-07-19 (×2): qty 3
  Filled 2011-07-19: qty 2
  Filled 2011-07-19: qty 3
  Filled 2011-07-19 (×3): qty 2
  Filled 2011-07-19 (×3): qty 1
  Filled 2011-07-19: qty 2
  Filled 2011-07-19: qty 3
  Filled 2011-07-19 (×2): qty 2
  Filled 2011-07-19: qty 1

## 2011-07-19 NOTE — Plan of Care (Signed)
Problem: Phase II Progression Outcomes Goal: Return of bowel function (flatus, BM) IF ABDOMINAL SURGERY:  Outcome: Progressing +FLATUS

## 2011-07-19 NOTE — Progress Notes (Signed)
Patient ID: [name], female   DOB: [birth year], 46 y.o.   MRN: [id number] 9 Days Post-Op  Subjective: Still with pain, some slight increase in RLQ pain today after fluid aspiration. No flatus or BM.  She is not nauseated  Objective: Vital signs in last 24 hours: Temp:  [97.9 F (36.6 C)-99 F (37.2 C)] 97.9 F (36.6 C) (02/23 0600) Pulse Rate:  [72-94] 74  (02/23 0600) Resp:  [12-24] 16  (02/23 0600) BP: (119-149)/(72-93) 120/85 mmHg (02/23 0600) SpO2:  [90 %-99 %] 91 % (02/23 0600) Last BM Date: 07/13/11  Intake/Output from previous day: 02/22 0701 - 02/23 0700 In: 2184.2 [I.V.:2184.2] Out: 650 [Urine:650] Intake/Output this shift:    General appearance: alert and no distress GI: abnormal findings:  moderate tenderness in the RLQ and some tenderness diffusely.  Mild distention Incision/Wound: Clean and dry  Lab Results:   Basename 07/19/11 0435 07/18/11 0420  WBC 11.5* 12.0*  HGB 9.4* 9.2*  HCT 28.8* 27.8*  PLT 631* 502*   BMET No results found for this basename: NA:2,K:2,CL:2,CO2:2,GLUCOSE:2,BUN:2,CREATININE:2,CALCIUM:2 in the last 72 hours   Studies/Results: [place] Abscess Drain  07/18/2011  *RADIOLOGY REPORT*  Clinical history:History of appendicitis and pelvic fluid collection.  PROCEDURE(S): ULTRASOUND GUIDED ASPIRATION OF PELVIC FLUID COLLECTION  Physician: Rachelle Hora. Henn, MD  Medications:Versed 2 mg, Fentanyl 200 mcg. A radiology nurse monitored the patient for moderate sedation.  Moderate sedation time:35 minutes  Fluoroscopy time: None  Procedure:The procedure was explained to the patient.  The risks and benefits of the procedure were discussed and the patient's questions were addressed.  Informed consent was obtained from the patient.  The patient was placed supine on the examination table. Ultrasound demonstrated a fluid pocket in the anterior right lower pelvis.  The abdomen was not prepped and draped in a sterile fashion.  The skin was anesthetized with  lidocaine.  18 gauge needle was directed into the collection with ultrasound guidance. Yellow serous fluid was aspirated.  A stiff Amplatz wire was placed and a Yueh catheter was advanced over the wire.  60 ml of serous fluid was removed.  Sample sent for Gram stain and culture.  Findings:Hypoechoic fluid collection in the anterior right pelvis. Yellow serous fluid was aspirated from this collection.  60 ml of fluid was removed.  Majority of the fluid was removed after aspiration.  Complications: None  Impression:Ultrasound guided aspiration of the pelvic fluid collection.  The fluid appeared to be serous and a sample was sent for Gram stain and culture.  A drain was not placed.  Original Report Authenticated By: Richarda Overlie, M.D.   Ct Abdomen Pelvis W Contrast  07/18/2011  *RADIOLOGY REPORT*  Clinical Data: Abdominal pain.  History of perforated appendicitis status post appendectomy on 07/13/2011.  CT ABDOMEN AND PELVIS WITH CONTRAST  Technique:  Multidetector CT imaging of the abdomen and pelvis was performed following the standard protocol during bolus administration of intravenous contrast.  Contrast: OMNIPAQUE IOHEXOL 300 MG/ML IV SOLN  Comparison: CT of the abdomen and pelvis dated 07/12/2011.  Findings:  Lung Bases: Moderate bilateral pleural effusions are simple in appearance and layer posteriorly.  These are associated with areas of passive atelectasis in the lower lobes of the lungs bilaterally. A small amount of gas in the medial aspect of the right hemithorax (images 1-5) is suspicious for a trace medial right pneumothorax, unlikely to be of clinical significance.  Abdomen/Pelvis:  The infused appearance of the liver, gallbladder, pancreas, spleen, bilateral adrenal glands  and bilateral kidneys is unremarkable.  A small amount of ascites is seen throughout the abdomen and pelvis, with the most significant accumulation extending up into the left upper quadrant around the spleen (where there is also  likely some retroperitoneal fluid), and in the sub hepatic space.  In the right lower quadrant (image 67 of series 2) there is a small focal 2.0 x 2.5 cm low attenuation collection of fluid with some rim enhancement that may represent a tiny abscess.  Superior to the urinary bladder there is a large collection measuring up to 11.5 x 5.8 cm (image 81 of series 2), with some thickening and enhancement of the adjacent peritoneum, concerning for a larger abscess. Thickening of the peritoneal lining and inflammatory changes are seen throughout the pelvis, particularly in the right lower quadrant. Multiple borderline dilated and mildly dilated (up to 3.7 cm in diameter) loops of small bowel are noted, most pronounced in the left side of the abdomen.  However, oral contrast material is seen to the level of the distal transverse colon.  Uterus and ovaries are unremarkable in appearance.  Small amount of gas is present in the nondependent portion of the urinary bladder, presumably iatrogenic.  Musculoskeletal: There are no aggressive appearing lytic or blastic lesions noted in the visualized portions of the skeleton. Small amount of inflammatory changes around the umbilicus likely from recent laparoscopic surgery.  IMPRESSION: 1.  Large rim enhancing fluid collection in the pelvis immediately above the urinary bladder, most consistent with a postoperative abscess.  There is a smaller rim enhancing fluid collection in the right lower quadrant, as above, that may represent an additional abscess.  There are extensive inflammatory changes in the mesentery in the right lower quadrant of the abdomen at this time, and the appearance of the small bowel suggests a postoperative ileus. 2.  Moderate bilateral pleural effusions with areas of passive atelectasis in the lower lobes of the lungs bilaterally. 3.  Small amount of gas in the lumen of the urinary bladder is presumably iatrogenic (clinical correlation for recent Foley catheter  placement is recommended to exclude infection with gas forming organisms). 4.  In the visualized portions of the thorax appears to be a small amount of pleural gas in the medial aspect the right hemithorax, that could indicate a trace pneumothorax (this is incompletely visualized, but based on the appearance, this is unlikely to be of clinical significance at this time). Alternatively, this could represent a tiny amount of pneumomediastinum.  These results were called by telephone on 07/18/2011  at  10:55 a.m. to  Barnetta Chapel, PA, who verbally acknowledged these results.  Original Report Authenticated By: Florencia Reasons, M.D.    Anti-infectives: Anti-infectives     Start     Dose/Rate Route Frequency Ordered Stop   07/17/11 1300   ertapenem (INVANZ) 1 g in sodium chloride 0.9 % 50 mL IVPB        1 g 100 mL/hr over 30 Minutes Intravenous Every 24 hours 07/17/11 1217     07/17/11 1000   amoxicillin-clavulanate (AUGMENTIN) 875-125 MG per tablet 1 tablet  Status:  Discontinued        1 tablet Oral Every 12 hours 07/17/11 0859 07/17/11 1217   07/11/11 1800   ertapenem (INVANZ) 1 g in sodium chloride 0.9 % 50 mL IVPB  Status:  Discontinued        1 g 100 mL/hr over 30 Minutes Intravenous Every 24 hours 07/10/11 2217 07/17/11 0859   07/10/11  1730   ertapenem (INVANZ) 1 g in sodium chloride 0.9 % 50 mL IVPB  Status:  Discontinued        1 g 100 mL/hr over 30 Minutes Intravenous Every 24 hours 07/10/11 1649 07/10/11 2217          Assessment/Plan: s/p Procedure(s): APPENDECTOMY LAPAROSCOPIC S/P drainage pelvic fluid collection, serous fluid, drain not left, Cx pending. CT shows some diffuse inflammation and apparent ileus Continue abx, CL diet, Ambulate  LOS: 9 days    Timmothy Baranowski T 07/19/2011

## 2011-07-20 LAB — URINALYSIS, ROUTINE W REFLEX MICROSCOPIC
Bilirubin Urine: NEGATIVE
Glucose, UA: NEGATIVE mg/dL
Hgb urine dipstick: NEGATIVE
Ketones, ur: NEGATIVE mg/dL
Leukocytes, UA: NEGATIVE
Nitrite: NEGATIVE
Protein, ur: NEGATIVE mg/dL
Specific Gravity, Urine: 1.01 (ref 1.005–1.030)
Urobilinogen, UA: 0.2 mg/dL (ref 0.0–1.0)
pH: 7.5 (ref 5.0–8.0)

## 2011-07-20 LAB — CBC
HCT: 27.8 % — ABNORMAL LOW (ref 36.0–46.0)
Hemoglobin: 9.2 g/dL — ABNORMAL LOW (ref 12.0–15.0)
RBC: 3.35 MIL/uL — ABNORMAL LOW (ref 3.87–5.11)
RDW: 16 % — ABNORMAL HIGH (ref 11.5–15.5)
WBC: 10.9 10*3/uL — ABNORMAL HIGH (ref 4.0–10.5)

## 2011-07-20 LAB — BASIC METABOLIC PANEL
BUN: <3 mg/dL — ABNORMAL LOW (ref 6–23)
CO2: 26 mEq/L (ref 19–32)
Calcium: 8.8 mg/dL (ref 8.4–10.5)
Chloride: 103 mEq/L (ref 96–112)
Creatinine, Ser: 0.83 mg/dL (ref 0.50–1.10)
GFR calc Af Amer: >90 mL/min (ref >90)
GFR calc non Af Amer: 84 mL/min — ABNORMAL LOW (ref >90)
Glucose, Bld: 119 mg/dL — ABNORMAL HIGH (ref 70–99)
Potassium: 3.9 mEq/L (ref 3.5–5.1)
Sodium: 136 mEq/L (ref 135–145)

## 2011-07-20 NOTE — Progress Notes (Signed)
Analycia has had urinary frequency throughout the night. 0440 am-- After voiding 150 post void bladder scan=208 2130 pm-- After voiding 200 post void bladder scan=155

## 2011-07-20 NOTE — Progress Notes (Signed)
Patient ID: [name], female   DOB: June 22, 1965, 46 y.o.   MRN: [id number] 10 Days Post-Op  Subjective: Still having lower abdominal pain, now more cramping.  Has passed flatus. No BM yet.  Denies nausea.  Voiding frquently  Objective: Vital signs in last 24 hours: Temp:  [97.8 F (36.6 C)-99 F (37.2 C)] 97.8 F (36.6 C) (02/24 0450) Pulse Rate:  [83-90] 88  (02/24 0450) Resp:  [16] 16  (02/24 0450) BP: (127-146)/(86-96) 146/96 mmHg (02/24 0450) SpO2:  [93 %-96 %] 96 % (02/24 0450) Last BM Date: 07/13/11  Intake/Output from previous day: 02/23 0701 - 02/24 0700 In: 2565 [P.O.:865; I.V.:1600; IV Piggyback:100] Out: 3250 [Urine:3250] Intake/Output this shift: Total I/O In: 100 [P.O.:100] Out: 800 [Urine:800]  General appearance: alert and no distress GI: abnormal findings:  mild tenderness in the lower abdomen, mild ditention Incision/Wound: Clean without evidence of infection  Lab Results:   Basename 07/20/11 0540 07/19/11 0435  WBC 10.9* 11.5*  HGB 9.2* 9.4*  HCT 27.8* 28.8*  PLT 575* 631*   BMET  Basename 07/20/11 0540  NA 136  K 3.9  CL 103  CO2 26  GLUCOSE 119*  BUN <3*  CREATININE 0.83  CALCIUM 8.8     Studies/Results: [place] Abscess Drain  07/18/2011  *RADIOLOGY REPORT*  Clinical history:History of appendicitis and pelvic fluid collection.  PROCEDURE(S): ULTRASOUND GUIDED ASPIRATION OF PELVIC FLUID COLLECTION  Physician: Rachelle Hora. Henn, MD  Medications:Versed 2 mg, Fentanyl 200 mcg. A radiology nurse monitored the patient for moderate sedation.  Moderate sedation time:35 minutes  Fluoroscopy time: None  Procedure:The procedure was explained to the patient.  The risks and benefits of the procedure were discussed and the patient's questions were addressed.  Informed consent was obtained from the patient.  The patient was placed supine on the examination table. Ultrasound demonstrated a fluid pocket in the anterior right lower pelvis.  The abdomen was not prepped  and draped in a sterile fashion.  The skin was anesthetized with lidocaine.  18 gauge needle was directed into the collection with ultrasound guidance. Yellow serous fluid was aspirated.  A stiff Amplatz wire was placed and a Yueh catheter was advanced over the wire.  60 ml of serous fluid was removed.  Sample sent for Gram stain and culture.  Findings:Hypoechoic fluid collection in the anterior right pelvis. Yellow serous fluid was aspirated from this collection.  60 ml of fluid was removed.  Majority of the fluid was removed after aspiration.  Complications: None  Impression:Ultrasound guided aspiration of the pelvic fluid collection.  The fluid appeared to be serous and a sample was sent for Gram stain and culture.  A drain was not placed.  Original Report Authenticated By: Richarda Overlie, M.D.   Ct Abdomen Pelvis W Contrast  07/18/2011  *RADIOLOGY REPORT*  Clinical Data: Abdominal pain.  History of perforated appendicitis status post appendectomy on 07/13/2011.  CT ABDOMEN AND PELVIS WITH CONTRAST  Technique:  Multidetector CT imaging of the abdomen and pelvis was performed following the standard protocol during bolus administration of intravenous contrast.  Contrast: OMNIPAQUE IOHEXOL 300 MG/ML IV SOLN  Comparison: CT of the abdomen and pelvis dated 07/12/2011.  Findings:  Lung Bases: Moderate bilateral pleural effusions are simple in appearance and layer posteriorly.  These are associated with areas of passive atelectasis in the lower lobes of the lungs bilaterally. A small amount of gas in the medial aspect of the right hemithorax (images 1-5) is suspicious for a trace  medial right pneumothorax, unlikely to be of clinical significance.  Abdomen/Pelvis:  The infused appearance of the liver, gallbladder, pancreas, spleen, bilateral adrenal glands and bilateral kidneys is unremarkable.  A small amount of ascites is seen throughout the abdomen and pelvis, with the most significant accumulation extending up  into the left upper quadrant around the spleen (where there is also likely some retroperitoneal fluid), and in the sub hepatic space.  In the right lower quadrant (image 67 of series 2) there is a small focal 2.0 x 2.5 cm low attenuation collection of fluid with some rim enhancement that may represent a tiny abscess.  Superior to the urinary bladder there is a large collection measuring up to 11.5 x 5.8 cm (image 81 of series 2), with some thickening and enhancement of the adjacent peritoneum, concerning for a larger abscess. Thickening of the peritoneal lining and inflammatory changes are seen throughout the pelvis, particularly in the right lower quadrant. Multiple borderline dilated and mildly dilated (up to 3.7 cm in diameter) loops of small bowel are noted, most pronounced in the left side of the abdomen.  However, oral contrast material is seen to the level of the distal transverse colon.  Uterus and ovaries are unremarkable in appearance.  Small amount of gas is present in the nondependent portion of the urinary bladder, presumably iatrogenic.  Musculoskeletal: There are no aggressive appearing lytic or blastic lesions noted in the visualized portions of the skeleton. Small amount of inflammatory changes around the umbilicus likely from recent laparoscopic surgery.  IMPRESSION: 1.  Large rim enhancing fluid collection in the pelvis immediately above the urinary bladder, most consistent with a postoperative abscess.  There is a smaller rim enhancing fluid collection in the right lower quadrant, as above, that may represent an additional abscess.  There are extensive inflammatory changes in the mesentery in the right lower quadrant of the abdomen at this time, and the appearance of the small bowel suggests a postoperative ileus. 2.  Moderate bilateral pleural effusions with areas of passive atelectasis in the lower lobes of the lungs bilaterally. 3.  Small amount of gas in the lumen of the urinary bladder is  presumably iatrogenic (clinical correlation for recent Foley catheter placement is recommended to exclude infection with gas forming organisms). 4.  In the visualized portions of the thorax appears to be a small amount of pleural gas in the medial aspect the right hemithorax, that could indicate a trace pneumothorax (this is incompletely visualized, but based on the appearance, this is unlikely to be of clinical significance at this time). Alternatively, this could represent a tiny amount of pneumomediastinum.  These results were called by telephone on 07/18/2011  at  10:55 a.m. to  Barnetta Chapel, PA, who verbally acknowledged these results.  Original Report Authenticated By: Florencia Reasons, M.D.    Anti-infectives: Anti-infectives     Start     Dose/Rate Route Frequency Ordered Stop   07/17/11 1300   ertapenem (INVANZ) 1 g in sodium chloride 0.9 % 50 mL IVPB        1 g 100 mL/hr over 30 Minutes Intravenous Every 24 hours 07/17/11 1217     07/17/11 1000   amoxicillin-clavulanate (AUGMENTIN) 875-125 MG per tablet 1 tablet  Status:  Discontinued        1 tablet Oral Every 12 hours 07/17/11 0859 07/17/11 1217   07/11/11 1800   ertapenem (INVANZ) 1 g in sodium chloride 0.9 % 50 mL IVPB  Status:  Discontinued  1 g 100 mL/hr over 30 Minutes Intravenous Every 24 hours 07/10/11 2217 07/17/11 0859   07/10/11 1730   ertapenem (INVANZ) 1 g in sodium chloride 0.9 % 50 mL IVPB  Status:  Discontinued        1 g 100 mL/hr over 30 Minutes Intravenous Every 24 hours 07/10/11 1649 07/10/11 2217          Assessment/Plan: s/p Procedure(s): APPENDECTOMY LAPAROSCOPIC for perforated appendicitis and aspiration of serous fluid collection-gram stain neg and NG 1 day Continued pain but bowel function returning, WBC down, does not appear ill and CT without other specific problem Cont abx Urinary frequency-will decrease IVF, check U/A Advance diet     LOS: 10 days    [name]  T 07/20/2011

## 2011-07-21 LAB — CULTURE, ROUTINE-ABSCESS
Culture: NO GROWTH
Special Requests: NORMAL

## 2011-07-21 MED ORDER — PANTOPRAZOLE SODIUM 40 MG PO TBEC
40.0000 mg | DELAYED_RELEASE_TABLET | Freq: Every day | ORAL | Status: DC
  Administered 2011-07-21 – 2011-07-22 (×2): 40 mg via ORAL
  Filled 2011-07-21 (×3): qty 1

## 2011-07-21 MED ORDER — OXYCODONE-ACETAMINOPHEN 5-325 MG PO TABS
1.0000 | ORAL_TABLET | ORAL | Status: DC | PRN
  Administered 2011-07-21 (×2): 2 via ORAL
  Administered 2011-07-21 (×2): 1 via ORAL
  Administered 2011-07-22: 2 via ORAL
  Filled 2011-07-21 (×2): qty 2
  Filled 2011-07-21: qty 1
  Filled 2011-07-21: qty 2
  Filled 2011-07-21: qty 1

## 2011-07-21 MED ORDER — AMOXICILLIN-POT CLAVULANATE 875-125 MG PO TABS
1.0000 | ORAL_TABLET | Freq: Two times a day (BID) | ORAL | Status: DC
  Administered 2011-07-21 – 2011-07-22 (×3): 1 via ORAL
  Filled 2011-07-21 (×6): qty 1

## 2011-07-21 MED ORDER — POLYETHYLENE GLYCOL 3350 17 G PO PACK
17.0000 g | PACK | Freq: Every day | ORAL | Status: DC
  Administered 2011-07-21 – 2011-07-22 (×2): 17 g via ORAL
  Filled 2011-07-21 (×3): qty 1

## 2011-07-21 MED ORDER — BISACODYL 10 MG RE SUPP
10.0000 mg | Freq: Once | RECTAL | Status: AC
  Administered 2011-07-21: 10 mg via RECTAL
  Filled 2011-07-21: qty 1

## 2011-07-21 NOTE — Progress Notes (Signed)
The patient is receiving Protonix by the intravenous route.  Based on criteria approved by the Pharmacy and Therapeutics Committee and the Medical Executive Committee, the medication is being converted to the equivalent oral dose form.  These criteria include: -No Active GI bleeding -Able to tolerate diet of full liquids (or better) or tube feeding OR able to tolerate other medications by the oral or enteral route  If you have any questions about this conversion, please contact the Pharmacy Department (ext [phone]).  Thank you.  Hessie Knows, PharmD, BCPS pager [phone] 07/21/2011 10:23 AM

## 2011-07-21 NOTE — Progress Notes (Signed)
Langley Flatley M. Joletta Manner, MD, FACS General, Bariatric, & Minimally Invasive Surgery [facility]  

## 2011-07-21 NOTE — Progress Notes (Signed)
Pt walking in halls this morning +flatus no bowel movement yet. Atiba Kimberlin, Frann Rider, RN

## 2011-07-21 NOTE — Progress Notes (Signed)
Patient ID: [name], female   DOB: 1965/10/28, 46 y.o.   MRN: [id number] 11 Days Post-Op  Subjective: Pt feeling much better.  Tolerating a regular diet.  Still no BM yet.  Objective: Vital signs in last 24 hours: Temp:  [98 F (36.7 C)-98.8 F (37.1 C)] 98.8 F (37.1 C) (02/25 0510) Pulse Rate:  [83-90] 90  (02/25 0510) Resp:  [18] 18  (02/25 0510) BP: (124-132)/(84-90) 132/90 mmHg (02/25 0510) SpO2:  [93 %-96 %] 93 % (02/25 0510) Last BM Date: 07/13/11  Intake/Output from previous day: 02/24 0701 - 02/25 0700 In: 2949.2 [P.O.:820; I.V.:2129.2] Out: 2825 [Urine:2825] Intake/Output this shift:    PE: Abd: soft, much less distended, +BS, incisions c/d/i Heart: regular Lungs: CTAB  Lab Results:   Basename 07/20/11 0540 07/19/11 0435  WBC 10.9* 11.5*  HGB 9.2* 9.4*  HCT 27.8* 28.8*  PLT 575* 631*   BMET  Basename 07/20/11 0540  NA 136  K 3.9  CL 103  CO2 26  GLUCOSE 119*  BUN <3*  CREATININE 0.83  CALCIUM 8.8   PT/INR No results found for this basename: LABPROT:2,INR:2 in the last 72 hours   Studies/Results: No results found.  Anti-infectives: Anti-infectives     Start     Dose/Rate Route Frequency Ordered Stop   07/17/11 1300   ertapenem (INVANZ) 1 g in sodium chloride 0.9 % 50 mL IVPB        1 g 100 mL/hr over 30 Minutes Intravenous Every 24 hours 07/17/11 1217     07/17/11 1000   amoxicillin-clavulanate (AUGMENTIN) 875-125 MG per tablet 1 tablet  Status:  Discontinued        1 tablet Oral Every 12 hours 07/17/11 0859 07/17/11 1217   07/11/11 1800   ertapenem (INVANZ) 1 g in sodium chloride 0.9 % 50 mL IVPB  Status:  Discontinued        1 g 100 mL/hr over 30 Minutes Intravenous Every 24 hours 07/10/11 2217 07/17/11 0859   07/10/11 1730   ertapenem (INVANZ) 1 g in sodium chloride 0.9 % 50 mL IVPB  Status:  Discontinued        1 g 100 mL/hr over 30 Minutes Intravenous Every 24 hours 07/10/11 1649 07/10/11 2217            Assessment/Plan  1. perf appy, s/p lap appy 2. Intra-abd fluid collection, s/p aspiration 3. Post-op ileus, resolving  Plan: 1. Change to po abx 2. Give suppository, add miralax 3. Anticipate dc tomorrow.   LOS: 11 days    Leston Schueller E 07/21/2011

## 2011-07-22 MED ORDER — OXYCODONE-ACETAMINOPHEN 5-325 MG PO TABS: 1.0000 | ORAL_TABLET | ORAL | Status: AC | PRN

## 2011-07-22 MED ORDER — AMOXICILLIN-POT CLAVULANATE 875-125 MG PO TABS: 1.0000 | ORAL_TABLET | Freq: Two times a day (BID) | ORAL | Status: AC

## 2011-07-22 MED ORDER — BISACODYL 10 MG RE SUPP
10.0000 mg | Freq: Once | RECTAL | Status: AC
  Administered 2011-07-22: 10 mg via RECTAL
  Filled 2011-07-22: qty 1

## 2011-07-22 NOTE — Progress Notes (Signed)
Pt for d/c home today. No IV site noted. Skin glue/dermabond x3 to abdomen intact. Bandaid dsng to RLQ intact. Pt had BM-medium amount this am after Dulcolax supp given. No changes in am assessments today. Pt's mother unable to come pick up pt d/t weather. Brother will be able to come pick up pt as claimed. D/C instructions & RX given with verbalized understanding. Follow up appt emphasized on pt.

## 2011-07-22 NOTE — Progress Notes (Signed)
CARE MANAGEMENT NOTE 07/22/2011  Patient:  [name], [name]   Account Number:  [id number]  Date Initiated:  07/21/2011  Documentation initiated by:  Colleen Can  Subjective/Objective Assessment:   dx acte appendicitis with perforation and periappendiceal abscess; appendectomy     Action/Plan:   Cm spoke with patient. Patient plans to return to her home where her mother will be caregiver upon discharge. No hh needs or equipment needs assessed   Anticipated DC Date:  07/22/2011   Anticipated DC Plan:  HOME/SELF CARE  In-house referral  Financial Counselor      DC Planning Services  CM consult      [facility] Choice  NA   Choice offered to / List presented to:  NA   DME arranged  NA      DME agency  NA     HH arranged  NA      HH agency  NA   Status of service:  Completed, signed off Medicare Important Message given?  NO (If response is "NO", the following Medicare IM given date fields will be blank) Date Medicare IM given:   Date Additional Medicare IM given:    Discharge Disposition:  HOME/SELF CARE  Per UR Regulation:    Comments:  Pt states she goes to [facility] for medical needs; states they do follow up care for medical patients and will serve as primary care for patient. States she currently does not have insurance but is in the process of getting some. She has been seen by [facility]. Pt for discharge to home with family support.

## 2011-07-22 NOTE — Progress Notes (Signed)
Patient ID: [name], female   DOB: [birth year], 46 y.o.   MRN: [id number] 12 Days Post-Op  Subjective: Pt feels ok, but c/o some abdominal pain.  Didn't have much BM with suppository yesterday.  Still tolerating regular diet.  Objective: Vital signs in last 24 hours: Temp:  [97.9 F (36.6 C)-98.9 F (37.2 C)] 98.9 F (37.2 C) (02/26 0629) Pulse Rate:  [80-88] 88  (02/26 0629) Resp:  [18] 18  (02/26 0629) BP: (104-128)/(71-84) 128/72 mmHg (02/26 0629) SpO2:  [94 %-95 %] 95 % (02/26 0629) Last BM Date: 07/21/11 (small)  Intake/Output from previous day: 02/25 0701 - 02/26 0700 In: 1240 [P.O.:1240] Out: -  Intake/Output this shift:    PE: Abd: soft, less distended, some abdominal tenderness, incisions c/d/i  Lab Results:   Basename 07/20/11 0540  WBC 10.9*  HGB 9.2*  HCT 27.8*  PLT 575*   BMET  Basename 07/20/11 0540  NA 136  K 3.9  CL 103  CO2 26  GLUCOSE 119*  BUN <3*  CREATININE 0.83  CALCIUM 8.8   PT/INR No results found for this basename: LABPROT:2,INR:2 in the last 72 hours   Studies/Results: No results found.  Anti-infectives: Anti-infectives     Start     Dose/Rate Route Frequency Ordered Stop   07/21/11 1000   amoxicillin-clavulanate (AUGMENTIN) 875-125 MG per tablet 1 tablet        1 tablet Oral Every 12 hours 07/21/11 0739     07/17/11 1300   ertapenem (INVANZ) 1 g in sodium chloride 0.9 % 50 mL IVPB  Status:  Discontinued        1 g 100 mL/hr over 30 Minutes Intravenous Every 24 hours 07/17/11 1217 07/21/11 0739   07/17/11 1000   amoxicillin-clavulanate (AUGMENTIN) 875-125 MG per tablet 1 tablet  Status:  Discontinued        1 tablet Oral Every 12 hours 07/17/11 0859 07/17/11 1217   07/11/11 1800   ertapenem (INVANZ) 1 g in sodium chloride 0.9 % 50 mL IVPB  Status:  Discontinued        1 g 100 mL/hr over 30 Minutes Intravenous Every 24 hours 07/10/11 2217 07/17/11 0859   07/10/11 1730   ertapenem (INVANZ) 1 g in sodium chloride 0.9 %  50 mL IVPB  Status:  Discontinued        1 g 100 mL/hr over 30 Minutes Intravenous Every 24 hours 07/10/11 1649 07/10/11 2217           Assessment/Plan  1. perf appy, s/p lap appy 2. Sterile fluid collection, s/p aspiration 3. Post op ileus  Plan: 1. Will give another suppository.  Would like for patient to have a BM before she goes home as she has not had a BM in over 2 weeks.  Once she has a BM, then she can go home.   LOS: 12 days    Nekayla Heider E 07/22/2011

## 2011-07-22 NOTE — Discharge Summary (Signed)
Megan Kleckley M. Nayellie Sanseverino, MD, FACS General, Bariatric, & Minimally Invasive Surgery [facility]  

## 2011-07-22 NOTE — Progress Notes (Signed)
Kaetlin Bullen M. Eilene Voigt, MD, FACS General, Bariatric, & Minimally Invasive Surgery [facility]  

## 2011-07-22 NOTE — Discharge Summary (Signed)
Patient ID: [name] MRN: [id number] DOB/AGE: 07-06-1965 45 y.o.  Admit date: 07/10/2011 Discharge date: 07/22/2011  Procedures:  Laparoscopic appendectomy  Consults: None  Reason for Admission:  This is a 46 yo female who began having abdominal pain 3 days prior to admission.  She was initially treated for a UTI, but pain persisted.  She came to [facility] where she had a CT scan that revealed appendicitis.  Admission Diagnoses: 1. Appendicitis 2. UTI 3. Asthma  Hospital Course: The patient was admitted and started on IV abx.  She was urgently taken to the operating room where she underwent a laparoscopic appendectomy.  She was found to have a perforated appendix with periappendiceal abscess.  Postoperatively, the patient's WBC began to increase and she complained of increasing pain.  She did develop an ileus as well.  She had a CT scan on POD# 2 which ruled out any post op complication.  She had an NGT placed due to nausea, vomiting, and abdominal distention.  This was kept in for several days, but was eventually removed on POD# 6.  Her WBC trended back down towards normal.  On POD# 7, she developed acute worsening RLQ abdominal pain.  She then had another CT scan which revealed a large pelvic fluid collection.  IR was consulted and this was aspirated.  A JP drain was not left in place as this appeared to be serous fluid.  It was sent for culture which revealed no growth.  She was kept on IV Invanz for the duration of her stay, until the day prior to discharge she was switched to po augmentin.  Once this fluid collection was aspirated, the patient began to significantly improve.  Her WBCs remained around normal and her diet was able to advanced.  She finally had a BM on POD# 12 and was felt stable for discharge home.  Discharge Diagnoses:  Principal Problem:  *Acute appendicitis with perforation and peritoneal abscess s/p lap appy Post-op ileus, resolved Post-op fluid collection (sterile) s/p  aspiration  Discharge Medications: Medication List  As of 07/22/2011  3:38 PM   STOP taking these medications         ciprofloxacin 500 MG tablet      HYDROcodone-acetaminophen 5-500 MG per tablet         TAKE these medications         amoxicillin-clavulanate 875-125 MG per tablet   Commonly known as: AUGMENTIN   Take 1 tablet by mouth every 12 (twelve) hours.      oxyCODONE-acetaminophen 5-325 MG per tablet   Commonly known as: PERCOCET   Take 1-2 tablets by mouth every 4 (four) hours as needed.      VENTOLIN HFA 108 (90 BASE) MCG/ACT inhaler   Generic drug: albuterol   Inhale 2 puffs into the lungs every 4 (four) hours as needed. For shortness of breath.            Discharge Instructions: Follow-up Information    Follow up with Almyra Deforest, MD. (As needed)       Follow up with [facility], [place]. Schedule an appointment as soon as possible for a visit in 2 weeks.   Contact information:   [employer] [address] [place] [address] [phone]          Signed: Letha Cape 07/22/2011, 3:38 PM

## 2011-07-22 NOTE — Discharge Instructions (Signed)
[facility] ______[facility] °LAPAROSCOPIC SURGERY: POST OP INSTRUCTIONS °Always review your discharge instruction sheet given to you by the facility where your surgery was performed. °IF YOU HAVE DISABILITY OR FAMILY LEAVE FORMS, YOU MUST BRING THEM TO THE OFFICE FOR PROCESSING.   °DO NOT GIVE THEM TO YOUR DOCTOR. ° °1. A prescription for pain medication may be given to you upon discharge.  Take your pain medication as prescribed, if needed.  If narcotic pain medicine is not needed, then you may take acetaminophen (Tylenol) or ibuprofen (Advil) as needed. °2. Take your usually prescribed medications unless otherwise directed. °3. If you need a refill on your pain medication, please contact your pharmacy.  They will contact our office to request authorization. Prescriptions will not be filled after 5pm or on week-ends. °4. You should follow a light diet the first few days after arrival home, such as soup and crackers, etc.  Be sure to include lots of fluids daily. °5. Most patients will experience some swelling and bruising in the area of the incisions.  Ice packs will help.  Swelling and bruising can take several days to resolve.  °6. It is common to experience some constipation if taking pain medication after surgery.  Increasing fluid intake and taking a stool softener (such as Colace) will usually help or prevent this problem from occurring.  A mild laxative (Milk of Magnesia or Miralax) should be taken according to package instructions if there are no bowel movements after 48 hours. °7. Unless discharge instructions indicate otherwise, you may remove your bandages 24-48 hours after surgery, and you may shower at that time.  You may have steri-strips (small skin tapes) in place directly over the incision.  These strips should be left on the skin for 7-10 days.  If your surgeon used skin glue on the incision, you may shower in 24 hours.  The glue will flake off over the next 2-3 weeks.  Any sutures or  staples will be removed at the office during your follow-up visit. °8. ACTIVITIES:  You may resume regular (light) daily activities beginning the next day--such as daily self-care, walking, climbing stairs--gradually increasing activities as tolerated.  You may have sexual intercourse when it is comfortable.  Refrain from any heavy lifting or straining until approved by your doctor. °a. You may drive when you are no longer taking prescription pain medication, you can comfortably wear a seatbelt, and you can safely maneuver your car and apply brakes. °b. RETURN TO WORK:  __________________________________________________________ °9. You should see your doctor in the office for a follow-up appointment approximately 2-3 weeks after your surgery.  Make sure that you call for this appointment within a day or two after you arrive home to insure a convenient appointment time. °10. OTHER INSTRUCTIONS: __________________________________________________________________________________________________________________________ __________________________________________________________________________________________________________________________ °WHEN TO CALL YOUR DOCTOR: °1. Fever over 101.0 °2. Inability to urinate °3. Continued bleeding from incision. °4. Increased pain, redness, or drainage from the incision. °5. Increasing abdominal pain ° °The clinic staff is available to answer your questions during regular business hours.  Please don’t hesitate to call and ask to speak to one of the nurses for clinical concerns.  If you have a medical emergency, go to the nearest emergency room or call 911.  A surgeon from [facility] is always on call at the hospital. °[address], [address], [place], [place]  [address] ? [address] [address], [place], [place]   [address] °[phone] ? [phone] ? FAX [phone] °Web site:   [email or web address] °

## 2011-07-28 ENCOUNTER — Other Ambulatory Visit (INDEPENDENT_AMBULATORY_CARE_PROVIDER_SITE_OTHER): Payer: Self-pay | Admitting: General Surgery

## 2011-07-28 ENCOUNTER — Encounter (HOSPITAL_COMMUNITY): Payer: Self-pay | Admitting: General Surgery

## 2011-08-12 ENCOUNTER — Ambulatory Visit (INDEPENDENT_AMBULATORY_CARE_PROVIDER_SITE_OTHER): Payer: Self-pay | Admitting: General Surgery

## 2011-08-12 ENCOUNTER — Encounter (INDEPENDENT_AMBULATORY_CARE_PROVIDER_SITE_OTHER): Payer: Self-pay | Admitting: General Surgery

## 2011-08-12 VITALS — BP 102/70 | HR 89 | Temp 97.9°F | Ht 66.0 in | Wt 170.4 lb

## 2011-08-12 DIAGNOSIS — Z09 Encounter for follow-up examination after completed treatment for conditions other than malignant neoplasm: Secondary | ICD-10-CM

## 2011-08-12 MED ORDER — HYDROCODONE-ACETAMINOPHEN 5-325 MG PO TABS: 1.0000 | ORAL_TABLET | Freq: Four times a day (QID) | ORAL | Status: AC | PRN

## 2011-08-12 NOTE — Progress Notes (Signed)
Subjective:     Patient ID: [name], female   DOB: [birth year], 46 y.o.   MRN: [id number]  HPI This is a 46 year old female underwent a laparoscopic appendectomy for gangrenous appendicitis. Postoperatively she had an ileus as well as an abscess that required drainage. She eventually was discharged home doing well. Today she returns reporting some occasional lower abdominal pains. She has no fevers, is having bowel movements, and is eating just fine.  Review of Systems     Objective:   Physical Exam Well healed incisions, abdomen nontender    Assessment:     S/p lap appy for appendicitis    Plan:     Return to work this week and return to see me as needed I did give her one last rx for pain meds as her last refill.

## 2012-11-17 ENCOUNTER — Encounter (HOSPITAL_COMMUNITY): Payer: Self-pay | Admitting: Emergency Medicine

## 2012-11-17 ENCOUNTER — Emergency Department (HOSPITAL_COMMUNITY)
Admission: EM | Admit: 2012-11-17 | Discharge: 2012-11-17 | Disposition: A | Discharge status: Home / Self Care | Payer: BC Managed Care – PPO | Attending: Emergency Medicine | Admitting: Emergency Medicine

## 2012-11-17 DIAGNOSIS — Z79899 Other long term (current) drug therapy: Secondary | ICD-10-CM | POA: Insufficient documentation

## 2012-11-17 DIAGNOSIS — Z862 Personal history of diseases of the blood and blood-forming organs and certain disorders involving the immune mechanism: Secondary | ICD-10-CM | POA: Insufficient documentation

## 2012-11-17 DIAGNOSIS — Z8781 Personal history of (healed) traumatic fracture: Secondary | ICD-10-CM | POA: Insufficient documentation

## 2012-11-17 DIAGNOSIS — M549 Dorsalgia, unspecified: Secondary | ICD-10-CM

## 2012-11-17 DIAGNOSIS — M545 Low back pain, unspecified: Secondary | ICD-10-CM | POA: Insufficient documentation

## 2012-11-17 DIAGNOSIS — F17200 Nicotine dependence, unspecified, uncomplicated: Secondary | ICD-10-CM | POA: Insufficient documentation

## 2012-11-17 DIAGNOSIS — R3915 Urgency of urination: Secondary | ICD-10-CM | POA: Insufficient documentation

## 2012-11-17 DIAGNOSIS — J45909 Unspecified asthma, uncomplicated: Secondary | ICD-10-CM | POA: Insufficient documentation

## 2012-11-17 DIAGNOSIS — M62838 Other muscle spasm: Secondary | ICD-10-CM

## 2012-11-17 DIAGNOSIS — Z8742 Personal history of other diseases of the female genital tract: Secondary | ICD-10-CM | POA: Insufficient documentation

## 2012-11-17 DIAGNOSIS — R300 Dysuria: Secondary | ICD-10-CM | POA: Insufficient documentation

## 2012-11-17 DIAGNOSIS — M5380 Other specified dorsopathies, site unspecified: Secondary | ICD-10-CM | POA: Insufficient documentation

## 2012-11-17 DIAGNOSIS — Z8659 Personal history of other mental and behavioral disorders: Secondary | ICD-10-CM | POA: Insufficient documentation

## 2012-11-17 LAB — URINALYSIS, ROUTINE W REFLEX MICROSCOPIC
Glucose, UA: NEGATIVE mg/dL
Hgb urine dipstick: NEGATIVE
Ketones, ur: NEGATIVE mg/dL
Leukocytes, UA: NEGATIVE
Protein, ur: NEGATIVE mg/dL
pH: 5 (ref 5.0–8.0)

## 2012-11-17 MED ORDER — KETOROLAC TROMETHAMINE 60 MG/2ML IM SOLN
60.0000 mg | Freq: Once | INTRAMUSCULAR | Status: AC
  Administered 2012-11-17: 60 mg via INTRAMUSCULAR
  Filled 2012-11-17: qty 2

## 2012-11-17 MED ORDER — IBUPROFEN 800 MG PO TABS: 800.0000 mg | ORAL_TABLET | Freq: Three times a day (TID) | ORAL | Status: DC

## 2012-11-17 MED ORDER — METHOCARBAMOL 500 MG PO TABS: 500.0000 mg | ORAL_TABLET | Freq: Two times a day (BID) | ORAL | Status: DC

## 2012-11-17 NOTE — ED Provider Notes (Signed)
History    This chart was scribed for non-physician practitioner Roxy Horseman PA-C working with Ward Givens, MD by Smitty Pluck, ED scribe. This patient was seen in room WTR7/WTR7 and the patient's care was started at 4:03 PM.  CSN: [id number] Arrival date & time 11/17/12  1440    Chief Complaint  Patient presents with  . Sciatica   The history is provided by the patient and medical records. No language interpreter was used.   HPI Comments: [name] is a 47 y.o. female who presents to the Emergency Department with chief complaint of constant, moderate bilateral lower back pain that radiates down left leg onset 2 days ago. Pt reports that pain is rated at 10/10. She states that she has taken tylenol without relief of pain. She states that she has increased urinary urgency, dysuria and difficulty urinating that has been ongoing. She states that bending and certain positions aggravate the back pain. Pt denies recent injury to back, fall, urinary incontinence, bowel incontinence, hematuria, fever, chills, nausea, vomiting, diarrhea, weakness, cough, SOB and any other pain.     Past Medical History  Diagnosis Date  . Depression     Follows with [facility], history of voluntary admission to [facility].  History of suisidal ideation with drug od (50 pills of ibuprofen).   . Bronchial asthma   . Tobacco abuse   . History of cocaine abuse     Quit in 2009  . Marijuana abuse     Hx of, quit in 2009  . Alcohol abuse     Hx of, quit in 2009  . Transaminitis     Considered to be secondary to alchol use.   . Adnexal mass 2006    Bilateral ovarian cystic masses- recomended GYN FU.   . Menorrhagia 2006    Endometiral Biopsy- DEGENERATING SECRETORY-TYPE ENDOMETRIUM  . Ankle fracture     Bimalleolar sp closed reduction under floroscopy.   . Normocytic anemia    Past Surgical History  Procedure Laterality Date  . Close reduction of bimalleolar ankle fracture    . Laparoscopic appendectomy   07/10/2011    Procedure: APPENDECTOMY LAPAROSCOPIC;  Surgeon: Emelia Loron, MD;  Location: [facility];  Service: General;  Laterality: N/A;   Family History  Problem Relation Age of Onset  . Diabetes Mother   . Hypertension Mother   . Cancer Mother     colon  . Hypertension Father   . Diabetes Brother   . Obesity Brother    History  Substance Use Topics  . Smoking status: Current Every Day Smoker -- 0.50 packs/day for 25 years  . Smokeless tobacco: Not on file  . Alcohol Use: No     Comment: Quit in 09.    OB History   Grav Para Term Preterm Abortions TAB SAB Ect Mult Living                 Review of Systems 10 Systems reviewed and all are negative for acute change except as noted in the HPI.   Allergies  Review of patient's allergies indicates no known allergies.  Home Medications   Current Outpatient Rx  Name  Route  Sig  Dispense  Refill  . OVER THE COUNTER MEDICATION   Topical   Apply 1 application topically daily as needed (for muscle pain). Over the counter muscle rub for pain         . albuterol (VENTOLIN HFA) 108 (90 BASE) MCG/ACT inhaler   Inhalation  Inhale 2 puffs into the lungs every 4 (four) hours as needed. For shortness of breath.           BP 143/86  Pulse 69  Temp(Src) 98.4 F (36.9 C) (Oral)  Resp 16  Ht 5\' 6"  (1.676 m)  Wt 170 lb (77.111 kg)  BMI 27.45 kg/m2  SpO2 99%  LMP 10/17/2012  Physical Exam  Nursing note and vitals reviewed. Constitutional: She is oriented to person, place, and time. She appears well-developed and well-nourished. No distress.  HENT:  Head: Normocephalic and atraumatic.  Eyes: Conjunctivae and EOM are normal. Right eye exhibits no discharge. Left eye exhibits no discharge. No scleral icterus.  Neck: Normal range of motion. Neck supple. No tracheal deviation present.  Cardiovascular: Normal rate, regular rhythm and normal heart sounds.  Exam reveals no gallop and no friction rub.   No murmur  heard. Pulmonary/Chest: Effort normal and breath sounds normal. No respiratory distress. She has no wheezes. She has no rales.  Abdominal: Soft. She exhibits no distension. There is no tenderness.  Musculoskeletal: Normal range of motion.  Lumbar paraspinal muscles tender to palpation No bony step off    Neurological: She is alert and oriented to person, place, and time.  Sensation and strength intact bilaterally  Skin: Skin is warm and dry. She is not diaphoretic.  Psychiatric: She has a normal mood and affect. Her behavior is normal. Judgment and thought content normal.    ED Course  Procedures (including critical care time) DIAGNOSTIC STUDIES: Oxygen Saturation is 99% on room air, normal by my interpretation.    COORDINATION OF CARE: 4:06 PM Discussed ED treatment with pt and pt agrees.  Medications  ketorolac (TORADOL) injection 60 mg (60 mg Intramuscular Given 11/17/12 1630)      Labs Reviewed  URINALYSIS, ROUTINE W REFLEX MICROSCOPIC   Results for orders placed during the hospital encounter of 11/17/12  URINALYSIS, ROUTINE W REFLEX MICROSCOPIC      Result Value Range   Color, Urine YELLOW  YELLOW   APPearance CLEAR  CLEAR   Specific Gravity, Urine 1.018  1.005 - 1.030   pH 5.0  5.0 - 8.0   Glucose, UA NEGATIVE  NEGATIVE mg/dL   Hgb urine dipstick NEGATIVE  NEGATIVE   Bilirubin Urine NEGATIVE  NEGATIVE   Ketones, ur NEGATIVE  NEGATIVE mg/dL   Protein, ur NEGATIVE  NEGATIVE mg/dL   Urobilinogen, UA 0.2  0.0 - 1.0 mg/dL   Nitrite NEGATIVE  NEGATIVE   Leukocytes, UA NEGATIVE  NEGATIVE    1. Back pain  MDM  Patient with back pain.  No neurological deficits and normal neuro exam.  Patient can walk but states is painful.  No loss of bowel or bladder control.  No concern for cauda equina.  No fever, night sweats, weight loss, h/o cancer, IVDU.  RICE protocol and pain medicine indicated and discussed with patient.    I personally performed the services described in  this documentation, which was scribed in my presence. The recorded information has been reviewed and is accurate.     Roxy Horseman, PA-C 11/17/12 1835

## 2012-11-17 NOTE — ED Notes (Signed)
Pt c/o low back pain that radiates down her legs since yesterday.  Denies injury.

## 2012-11-17 NOTE — ED Provider Notes (Signed)
Medical screening examination/treatment/procedure(s) were performed by non-physician practitioner and as supervising physician I was immediately available for consultation/collaboration. Devoria Albe, MD, Armando Gang   Ward Givens, MD 11/17/12 [phone]

## 2013-01-05 ENCOUNTER — Emergency Department (HOSPITAL_COMMUNITY)
Admission: EM | Admit: 2013-01-05 | Discharge: 2013-01-05 | Disposition: A | Discharge status: Hospice | Payer: BC Managed Care – PPO | Source: Home / Self Care | Attending: Emergency Medicine | Admitting: Emergency Medicine

## 2013-01-05 ENCOUNTER — Encounter (HOSPITAL_COMMUNITY): Payer: Self-pay | Admitting: Emergency Medicine

## 2013-01-05 ENCOUNTER — Emergency Department (HOSPITAL_COMMUNITY)
Admission: EM | Admit: 2013-01-05 | Discharge: 2013-01-05 | Disposition: A | Discharge status: Home / Self Care | Payer: BC Managed Care – PPO | Attending: Emergency Medicine | Admitting: Emergency Medicine

## 2013-01-05 DIAGNOSIS — N898 Other specified noninflammatory disorders of vagina: Secondary | ICD-10-CM | POA: Insufficient documentation

## 2013-01-05 DIAGNOSIS — R1032 Left lower quadrant pain: Secondary | ICD-10-CM | POA: Insufficient documentation

## 2013-01-05 DIAGNOSIS — Z8781 Personal history of (healed) traumatic fracture: Secondary | ICD-10-CM | POA: Insufficient documentation

## 2013-01-05 DIAGNOSIS — Z3202 Encounter for pregnancy test, result negative: Secondary | ICD-10-CM | POA: Insufficient documentation

## 2013-01-05 DIAGNOSIS — N939 Abnormal uterine and vaginal bleeding, unspecified: Secondary | ICD-10-CM

## 2013-01-05 DIAGNOSIS — R102 Pelvic and perineal pain: Secondary | ICD-10-CM

## 2013-01-05 DIAGNOSIS — Z8659 Personal history of other mental and behavioral disorders: Secondary | ICD-10-CM | POA: Insufficient documentation

## 2013-01-05 DIAGNOSIS — N949 Unspecified condition associated with female genital organs and menstrual cycle: Secondary | ICD-10-CM | POA: Insufficient documentation

## 2013-01-05 DIAGNOSIS — Z862 Personal history of diseases of the blood and blood-forming organs and certain disorders involving the immune mechanism: Secondary | ICD-10-CM | POA: Insufficient documentation

## 2013-01-05 DIAGNOSIS — Z87828 Personal history of other (healed) physical injury and trauma: Secondary | ICD-10-CM | POA: Insufficient documentation

## 2013-01-05 DIAGNOSIS — R110 Nausea: Secondary | ICD-10-CM | POA: Insufficient documentation

## 2013-01-05 DIAGNOSIS — Z8742 Personal history of other diseases of the female genital tract: Secondary | ICD-10-CM | POA: Insufficient documentation

## 2013-01-05 DIAGNOSIS — R109 Unspecified abdominal pain: Secondary | ICD-10-CM

## 2013-01-05 DIAGNOSIS — Z7982 Long term (current) use of aspirin: Secondary | ICD-10-CM | POA: Insufficient documentation

## 2013-01-05 DIAGNOSIS — F17200 Nicotine dependence, unspecified, uncomplicated: Secondary | ICD-10-CM | POA: Insufficient documentation

## 2013-01-05 DIAGNOSIS — Z9889 Other specified postprocedural states: Secondary | ICD-10-CM | POA: Insufficient documentation

## 2013-01-05 DIAGNOSIS — J45909 Unspecified asthma, uncomplicated: Secondary | ICD-10-CM | POA: Insufficient documentation

## 2013-01-05 LAB — CBC WITH DIFFERENTIAL/PLATELET
Eosinophils Relative: 1 % (ref 0–5)
HCT: 37.4 % (ref 36.0–46.0)
Hemoglobin: 13 g/dL (ref 12.0–15.0)
Lymphocytes Relative: 32 % (ref 12–46)
MCHC: 34.8 g/dL (ref 30.0–36.0)
MCV: 92.1 fL (ref 78.0–100.0)
Monocytes Absolute: 0.9 10*3/uL (ref 0.1–1.0)
Monocytes Relative: 7 % (ref 3–12)
Neutro Abs: 7.8 10*3/uL — ABNORMAL HIGH (ref 1.7–7.7)
RDW: 14.7 % (ref 11.5–15.5)
WBC: 12.9 10*3/uL — ABNORMAL HIGH (ref 4.0–10.5)

## 2013-01-05 LAB — POCT URINALYSIS DIP (DEVICE)
Ketones, ur: 40 mg/dL — AB
Protein, ur: >=300 mg/dL — AB
Specific Gravity, Urine: 1.015 (ref 1.005–1.030)
Urobilinogen, UA: >=8 mg/dL (ref 0.0–1.0)

## 2013-01-05 LAB — URINE MICROSCOPIC-ADD ON

## 2013-01-05 LAB — URINALYSIS, ROUTINE W REFLEX MICROSCOPIC
Nitrite: NEGATIVE
Protein, ur: 30 mg/dL — AB
Specific Gravity, Urine: 1.017 (ref 1.005–1.030)
Urobilinogen, UA: 0.2 mg/dL (ref 0.0–1.0)

## 2013-01-05 LAB — WET PREP, GENITAL
Clue Cells Wet Prep HPF POC: NONE SEEN
Trich, Wet Prep: NONE SEEN

## 2013-01-05 LAB — COMPREHENSIVE METABOLIC PANEL
BUN: 10 mg/dL (ref 6–23)
CO2: 24 mEq/L (ref 19–32)
Calcium: 9.3 mg/dL (ref 8.4–10.5)
Chloride: 103 mEq/L (ref 96–112)
Creatinine, Ser: 0.89 mg/dL (ref 0.50–1.10)
GFR calc Af Amer: 89 mL/min — ABNORMAL LOW (ref >90)
GFR calc non Af Amer: 77 mL/min — ABNORMAL LOW (ref >90)
Total Bilirubin: 0.1 mg/dL — ABNORMAL LOW (ref 0.3–1.2)

## 2013-01-05 LAB — POCT PREGNANCY, URINE: Preg Test, Ur: NEGATIVE

## 2013-01-05 MED ORDER — NAPROXEN 500 MG PO TABS: 500.0000 mg | ORAL_TABLET | Freq: Two times a day (BID) | ORAL | Status: DC

## 2013-01-05 MED ORDER — HYDROCODONE-ACETAMINOPHEN 5-325 MG PO TABS: 2.0000 | ORAL_TABLET | ORAL | Status: DC | PRN

## 2013-01-05 NOTE — ED Notes (Addendum)
C/o side pain since 12/31/12.  Patient states while she was on her regular cycle she pulled a muscle lifting a watermelon and ever since then she has had side pain and her menstrual cycle is more clots now.   OTC medication taken but no relief.

## 2013-01-05 NOTE — ED Notes (Signed)
Left flank pain and abd pain states her [period came back on and now she is having clots

## 2013-01-05 NOTE — ED Provider Notes (Signed)
CSN: [id number]     Arrival date & time 01/05/13  1343 History     First MD Initiated Contact with Patient 01/05/13 1521     Chief Complaint  Patient presents with  . Abdominal Pain  . Vaginal Bleeding   (Consider location/radiation/quality/duration/timing/severity/associated sxs/prior Treatment) Patient is a 47 y.o. female presenting with abdominal pain and vaginal bleeding. The history is provided by the patient and a parent.  Abdominal Pain Pain location:  LLQ Pain quality: fullness and sharp   Pain radiates to:  Does not radiate Pain severity:  Moderate Onset quality:  Gradual Duration:  2 weeks Timing:  Intermittent Progression:  Unchanged Chronicity:  New Relieved by:  NSAIDs Worsened by:  Bowel movements and palpation Ineffective treatments:  None tried Associated symptoms: vaginal bleeding   Associated symptoms: no chest pain, no chills, no cough, no diarrhea, no fatigue, no fever, no hematuria, no nausea, no shortness of breath, no sore throat, no vaginal discharge and no vomiting   Vaginal Bleeding Associated symptoms: abdominal pain   Associated symptoms: no back pain, no dizziness, no fatigue, no fever, no nausea and no vaginal discharge     Past Medical History  Diagnosis Date  . Depression     Follows with [facility], history of voluntary admission to [facility].  History of suisidal ideation with drug od (50 pills of ibuprofen).   . Bronchial asthma   . Tobacco abuse   . History of cocaine abuse     Quit in 2009  . Marijuana abuse     Hx of, quit in 2009  . Alcohol abuse     Hx of, quit in 2009  . Transaminitis     Considered to be secondary to alchol use.   . Adnexal mass 2006    Bilateral ovarian cystic masses- recomended GYN FU.   . Menorrhagia 2006    Endometiral Biopsy- DEGENERATING SECRETORY-TYPE ENDOMETRIUM  . Ankle fracture     Bimalleolar sp closed reduction under floroscopy.   . Normocytic anemia    Past Surgical History  Procedure Laterality Date   . Close reduction of bimalleolar ankle fracture    . Laparoscopic appendectomy  07/10/2011    Procedure: APPENDECTOMY LAPAROSCOPIC;  Surgeon: Emelia Loron, MD;  Location: [facility];  Service: General;  Laterality: N/A;   Family History  Problem Relation Age of Onset  . Diabetes Mother   . Hypertension Mother   . Cancer Mother     colon  . Hypertension Father   . Diabetes Brother   . Obesity Brother    History  Substance Use Topics  . Smoking status: Current Every Day Smoker -- 0.50 packs/day for 25 years  . Smokeless tobacco: Not on file  . Alcohol Use: No     Comment: Quit in 09.    OB History   Grav Para Term Preterm Abortions TAB SAB Ect Mult Living                 Review of Systems  Constitutional: Negative for fever, chills, diaphoresis and fatigue.  HENT: Negative for ear pain, congestion, sore throat, facial swelling, mouth sores, trouble swallowing, neck pain and neck stiffness.   Eyes: Negative.   Respiratory: Negative for apnea, cough, chest tightness, shortness of breath and wheezing.   Cardiovascular: Negative for chest pain, palpitations and leg swelling.  Gastrointestinal: Positive for abdominal pain. Negative for nausea, vomiting, diarrhea and abdominal distention.  Genitourinary: Positive for vaginal bleeding. Negative for hematuria, flank pain, vaginal discharge,  difficulty urinating and menstrual problem.  Musculoskeletal: Negative for back pain and gait problem.  Skin: Negative for rash and wound.  Neurological: Negative for dizziness, tremors, seizures, syncope, facial asymmetry, numbness and headaches.  Psychiatric/Behavioral: Negative.   All other systems reviewed and are negative.    Allergies  Review of patient's allergies indicates no known allergies.  Home Medications   Current Outpatient Rx  Name  Route  Sig  Dispense  Refill  . aspirin 325 MG tablet   Oral   Take 325 mg by mouth daily.         Marland Kitchen albuterol (VENTOLIN HFA) 108 (90  BASE) MCG/ACT inhaler   Inhalation   Inhale 2 puffs into the lungs every 4 (four) hours as needed. For shortness of breath.         Marland Kitchen HYDROcodone-acetaminophen (NORCO) 5-325 MG per tablet   Oral   Take 2 tablets by mouth every 4 (four) hours as needed (please only take for breakthrough pain not controlled with naproxen).   5 tablet   0   . naproxen (NAPROSYN) 500 MG tablet   Oral   Take 1 tablet (500 mg total) by mouth 2 (two) times daily.   30 tablet   0    BP 130/89  Pulse 80  Temp(Src) 97.6 F (36.4 C)  Resp 22  SpO2 100%  LMP 12/24/2012 Physical Exam  Nursing note and vitals reviewed. Constitutional: She is oriented to person, place, and time. She appears well-developed and well-nourished. No distress.  HENT:  Head: Normocephalic and atraumatic.  Right Ear: External ear normal.  Left Ear: External ear normal.  Nose: Nose normal.  Mouth/Throat: Oropharynx is clear and moist. No oropharyngeal exudate.  Eyes: Conjunctivae and EOM are normal. Pupils are equal, round, and reactive to light. Right eye exhibits no discharge. Left eye exhibits no discharge.  Neck: Normal range of motion. Neck supple. No JVD present. No tracheal deviation present. No thyromegaly present.  Cardiovascular: Normal rate, regular rhythm, normal heart sounds and intact distal pulses.  Exam reveals no gallop and no friction rub.   No murmur heard. Pulmonary/Chest: Effort normal and breath sounds normal. No respiratory distress. She has no wheezes. She has no rales. She exhibits no tenderness.  Abdominal: Soft. Bowel sounds are normal. She exhibits no distension. There is tenderness in the left lower quadrant. There is no rebound and no guarding.  Pt with mild LLQ but no rebound or peritonitis  Musculoskeletal: Normal range of motion.  Lymphadenopathy:    She has no cervical adenopathy.  Neurological: She is alert and oriented to person, place, and time. No cranial nerve deficit. Coordination normal.   Skin: Skin is warm. No rash noted. She is not diaphoretic.  Psychiatric: She has a normal mood and affect. Her behavior is normal. Judgment and thought content normal.    ED Course   Procedures (including critical care time)  Labs Reviewed  WET PREP, GENITAL - Abnormal; Notable for the following:    WBC, Wet Prep HPF POC FEW (*)    All other components within normal limits  CBC WITH DIFFERENTIAL - Abnormal; Notable for the following:    WBC 12.9 (*)    Neutro Abs 7.8 (*)    Lymphs Abs 4.1 (*)    All other components within normal limits  COMPREHENSIVE METABOLIC PANEL - Abnormal; Notable for the following:    Glucose, Bld 114 (*)    Total Bilirubin 0.1 (*)    GFR calc non Af  Amer 77 (*)    GFR calc Af Amer 89 (*)    All other components within normal limits  URINALYSIS, ROUTINE W REFLEX MICROSCOPIC - Abnormal; Notable for the following:    Color, Urine RED (*)    APPearance CLOUDY (*)    Hgb urine dipstick LARGE (*)    Protein, ur 30 (*)    Leukocytes, UA SMALL (*)    All other components within normal limits  URINE MICROSCOPIC-ADD ON - Abnormal; Notable for the following:    Squamous Epithelial / LPF FEW (*)    All other components within normal limits  GC/CHLAMYDIA PROBE AMP  POCT PREGNANCY, URINE   No results found. 1. Adnexal pain   2. Vaginal bleeding     MDM  47 yr old F pt here with LLQ pain. Patient says that it has been going on for the past [redacted] weeks along with vaginal bleeding described as the passing of large clots. Patient says she has never felt this before but does have a hx of ovarian cysts. Not sexually active for the past 5 years. No pain with urination, some pain with defecation and some nausea. No vomiting or loss of appetite. No loss of weight or fevers. Could be ovarian etiology such as cyst but doubt torsion given timeframe. Could be diverticulitis.  Pelvis shows pain is in the left adnexa only. Mild vaginal bleeding but no anemia. Doubt torsion given  time frame. Likely related to cyst, perhaps cyst rupture and dysfunctional uterine bleeding. Will ask patient to follow up with gynecologist as soon as possible.  Case discussed with Dr. Bebe Shaggy.  Sherryl Manges, MD 01/05/13 [phone]

## 2013-01-05 NOTE — ED Provider Notes (Signed)
A CSN: [id number]     Arrival date & time 01/05/13  1219 History     First MD Initiated Contact with Patient 01/05/13 1301     Chief Complaint  Patient presents with  . Flank Pain   (Consider location/radiation/quality/duration/timing/severity/associated sxs/prior Treatment) HPI Comments: Patient presents urgent care complaining of 2 distinctive symptoms. She is describing that since the beginning of the month she's been experiencing severe to moderate vaginal bleeding. She describes that it has not stopped in the last 2 weeks and she's been bleeding and seen date clots of blood. She also described that about 5-6 days ago she started some severe pain on her left flank region. She thinks it's related to a gesture activity she did the pain has since then progress and is worse when she walks or moves. ( Patient points to both left flank and left upper hemiabdomen).  Patient denies any fevers nausea or vomiting  Patient is a 47 y.o. female presenting with flank pain. The history is provided by the patient.  Flank Pain This is a new problem. The problem occurs constantly. Associated symptoms include abdominal pain. Pertinent negatives include no headaches and no shortness of breath. The symptoms are aggravated by walking, bending and twisting. Nothing relieves the symptoms. She has tried nothing for the symptoms.    Past Medical History  Diagnosis Date  . Depression     Follows with [facility], history of voluntary admission to [facility].  History of suisidal ideation with drug od (50 pills of ibuprofen).   . Bronchial asthma   . Tobacco abuse   . History of cocaine abuse     Quit in 2009  . Marijuana abuse     Hx of, quit in 2009  . Alcohol abuse     Hx of, quit in 2009  . Transaminitis     Considered to be secondary to alchol use.   . Adnexal mass 2006    Bilateral ovarian cystic masses- recomended GYN FU.   . Menorrhagia 2006    Endometiral Biopsy- DEGENERATING SECRETORY-TYPE ENDOMETRIUM  .  Ankle fracture     Bimalleolar sp closed reduction under floroscopy.   . Normocytic anemia    Past Surgical History  Procedure Laterality Date  . Close reduction of bimalleolar ankle fracture    . Laparoscopic appendectomy  07/10/2011    Procedure: APPENDECTOMY LAPAROSCOPIC;  Surgeon: Emelia Loron, MD;  Location: [facility];  Service: General;  Laterality: N/A;   Family History  Problem Relation Age of Onset  . Diabetes Mother   . Hypertension Mother   . Cancer Mother     colon  . Hypertension Father   . Diabetes Brother   . Obesity Brother    History  Substance Use Topics  . Smoking status: Current Every Day Smoker -- 0.50 packs/day for 25 years  . Smokeless tobacco: Not on file  . Alcohol Use: No     Comment: Quit in 09.    OB History   Grav Para Term Preterm Abortions TAB SAB Ect Mult Living                 Review of Systems  Constitutional: Negative for fever, diaphoresis, fatigue and unexpected weight change.  Respiratory: Negative for shortness of breath.   Gastrointestinal: Positive for abdominal pain. Negative for nausea, vomiting, diarrhea, constipation, blood in stool and rectal pain.  Genitourinary: Positive for flank pain. Negative for urgency, hematuria, vaginal bleeding, vaginal discharge, vaginal pain and pelvic pain.  Musculoskeletal:  Negative for myalgias.  Skin: Negative for color change, pallor, rash and wound.  Neurological: Negative for headaches.    Allergies  Review of patient's allergies indicates no known allergies.  Home Medications   Current Outpatient Rx  Name  Route  Sig  Dispense  Refill  . albuterol (VENTOLIN HFA) 108 (90 BASE) MCG/ACT inhaler   Inhalation   Inhale 2 puffs into the lungs every 4 (four) hours as needed. For shortness of breath.          Marland Kitchen ibuprofen (ADVIL,MOTRIN) 800 MG tablet   Oral   Take 1 tablet (800 mg total) by mouth 3 (three) times daily.   21 tablet   0   . methocarbamol (ROBAXIN) 500 MG tablet    Oral   Take 1 tablet (500 mg total) by mouth 2 (two) times daily.   20 tablet   0   . OVER THE COUNTER MEDICATION   Topical   Apply 1 application topically daily as needed (for muscle pain). Over the counter muscle rub for pain          BP 125/77  Pulse 77  Temp(Src) 97.9 F (36.6 C) (Oral)  Resp 16  SpO2 100%  LMP 12/24/2012 Physical Exam  Constitutional: Vital signs are normal. She appears well-developed and well-nourished.  Non-toxic appearance. She has a sickly appearance. She does not appear ill. No distress.    HENT:  Head: Normocephalic.  Musculoskeletal: She exhibits tenderness.  Neurological: She is alert.  Skin: No rash noted. No erythema.    ED Course   Procedures (including critical care time)  Labs Reviewed  POCT URINALYSIS DIP (DEVICE) - Abnormal; Notable for the following:    Glucose, UA 100 (*)    Bilirubin Urine LARGE (*)    Ketones, ur 40 (*)    Hgb urine dipstick LARGE (*)    pH 8.5 (*)    Protein, ur >=300 (*)    Leukocytes, UA LARGE (*)    All other components within normal limits  POCT PREGNANCY, URINE   No results found. 1. Abdominal pain   2. Flank pain   3. Vaginal bleeding     MDM  Patient presents urgent care complaining of 2 weeks of moderate to severe vaginal bleeding with blood clots and also with sudden onset of left flank pain. Most concerning in her exam is that she has reproducible and mild guarding to her left hemiabdomen. Patient will be transferred to the emergency department for further evaluation and for a concerning abdominal exam. I cannot completely rule out at this point that this exclusively gynecological in nature. Patient is hemodynamically stable,  Jimmie Molly, MD 01/05/13 1335

## 2013-01-06 LAB — GC/CHLAMYDIA PROBE AMP
CT Probe RNA: NEGATIVE
GC Probe RNA: NEGATIVE

## 2013-01-07 NOTE — ED Provider Notes (Signed)
I have personally seen and examined the patient.  I have discussed the plan of care with the resident.  I have reviewed the documentation on PMH/FH/Soc. History.  I have reviewed the documentation of the resident and agree.  Pt stable in the ED, doubt acute abd/gynecologic process Referred to gyn as outpatient   Joya Gaskins, MD 01/07/13 [phone]

## 2013-04-05 ENCOUNTER — Other Ambulatory Visit: Payer: Self-pay | Admitting: Obstetrics and Gynecology

## 2013-05-26 HISTORY — PX: BARTHOLIN GLAND CYST EXCISION: SHX565

## 2013-07-17 ENCOUNTER — Encounter (HOSPITAL_COMMUNITY): Payer: Self-pay | Admitting: Emergency Medicine

## 2013-07-17 ENCOUNTER — Emergency Department (HOSPITAL_COMMUNITY)
Admission: EM | Admit: 2013-07-17 | Discharge: 2013-07-17 | Disposition: A | Discharge status: Home / Self Care | Payer: BC Managed Care – PPO | Attending: Emergency Medicine | Admitting: Emergency Medicine

## 2013-07-17 DIAGNOSIS — R51 Headache: Secondary | ICD-10-CM | POA: Insufficient documentation

## 2013-07-17 DIAGNOSIS — H53149 Visual discomfort, unspecified: Secondary | ICD-10-CM | POA: Insufficient documentation

## 2013-07-17 DIAGNOSIS — R110 Nausea: Secondary | ICD-10-CM | POA: Insufficient documentation

## 2013-07-17 DIAGNOSIS — Z8742 Personal history of other diseases of the female genital tract: Secondary | ICD-10-CM | POA: Insufficient documentation

## 2013-07-17 DIAGNOSIS — Z79899 Other long term (current) drug therapy: Secondary | ICD-10-CM | POA: Insufficient documentation

## 2013-07-17 DIAGNOSIS — R519 Headache, unspecified: Secondary | ICD-10-CM

## 2013-07-17 DIAGNOSIS — Z8659 Personal history of other mental and behavioral disorders: Secondary | ICD-10-CM | POA: Insufficient documentation

## 2013-07-17 DIAGNOSIS — Z862 Personal history of diseases of the blood and blood-forming organs and certain disorders involving the immune mechanism: Secondary | ICD-10-CM | POA: Insufficient documentation

## 2013-07-17 DIAGNOSIS — Z8781 Personal history of (healed) traumatic fracture: Secondary | ICD-10-CM | POA: Insufficient documentation

## 2013-07-17 DIAGNOSIS — J45909 Unspecified asthma, uncomplicated: Secondary | ICD-10-CM | POA: Insufficient documentation

## 2013-07-17 DIAGNOSIS — F17200 Nicotine dependence, unspecified, uncomplicated: Secondary | ICD-10-CM | POA: Insufficient documentation

## 2013-07-17 MED ORDER — METOCLOPRAMIDE HCL 5 MG/ML IJ SOLN
10.0000 mg | Freq: Once | INTRAMUSCULAR | Status: AC
  Administered 2013-07-17: 10 mg via INTRAVENOUS
  Filled 2013-07-17: qty 2

## 2013-07-17 MED ORDER — DEXAMETHASONE SODIUM PHOSPHATE 10 MG/ML IJ SOLN
10.0000 mg | Freq: Once | INTRAMUSCULAR | Status: AC
  Administered 2013-07-17: 10 mg via INTRAVENOUS
  Filled 2013-07-17: qty 1

## 2013-07-17 MED ORDER — DIPHENHYDRAMINE HCL 50 MG/ML IJ SOLN
25.0000 mg | Freq: Once | INTRAMUSCULAR | Status: AC
  Administered 2013-07-17: 25 mg via INTRAVENOUS
  Filled 2013-07-17: qty 1

## 2013-07-17 MED ORDER — KETOROLAC TROMETHAMINE 30 MG/ML IJ SOLN
30.0000 mg | Freq: Once | INTRAMUSCULAR | Status: AC
  Administered 2013-07-17: 30 mg via INTRAVENOUS
  Filled 2013-07-17: qty 1

## 2013-07-17 NOTE — ED Provider Notes (Signed)
CSN: [id number]     Arrival date & time 07/17/13  0346 History   First MD Initiated Contact with Patient 07/17/13 [phone]     Chief Complaint  Patient presents with  . Headache   HPI  History provided by the patient. Patient is a 48 year old female with history of drug and alcohol abuse, depression who presents with complaints of persistent left-sided headache. Patient reports having headaches left-sided head that has been waxing and waning since Friday. She has been taking Tylenol sinus for her symptoms with only occasional improvements. This evening and early this morning she reports headache has made it difficult to sleep and rest. Headache also will cause occasional left eye tearing. She does report photophobia with symptoms and slight nausea. Denies any recent congestion, rhinorrhea, cough or cold. No fever, chills or sweats. She does report past history of similar headaches but states she has not had a headache this severe and over a year. This is not the worst headache of her life. She has not used any other medications for symptoms. No other aggravating or alleviating factors. No other associated symptoms.     Past Medical History  Diagnosis Date  . Depression     Follows with [facility], history of voluntary admission to [facility].  History of suisidal ideation with drug od (50 pills of ibuprofen).   . Bronchial asthma   . Tobacco abuse   . History of cocaine abuse     Quit in 2009  . Marijuana abuse     Hx of, quit in 2009  . Alcohol abuse     Hx of, quit in 2009  . Transaminitis     Considered to be secondary to alchol use.   . Adnexal mass 2006    Bilateral ovarian cystic masses- recomended GYN FU.   . Menorrhagia 2006    Endometiral Biopsy- DEGENERATING SECRETORY-TYPE ENDOMETRIUM  . Ankle fracture     Bimalleolar sp closed reduction under floroscopy.   . Normocytic anemia    Past Surgical History  Procedure Laterality Date  . Close reduction of bimalleolar ankle fracture    .  Laparoscopic appendectomy  07/10/2011    Procedure: APPENDECTOMY LAPAROSCOPIC;  Surgeon: Rolm Bookbinder, MD;  Location: [facility];  Service: General;  Laterality: N/A;   Family History  Problem Relation Age of Onset  . Diabetes Mother   . Hypertension Mother   . Cancer Mother     colon  . Hypertension Father   . Diabetes Brother   . Obesity Brother    History  Substance Use Topics  . Smoking status: Current Every Day Smoker -- 0.50 packs/day for 25 years    Types: Cigarettes  . Smokeless tobacco: Not on file  . Alcohol Use: No     Comment: Quit in 09.    OB History   Grav Para Term Preterm Abortions TAB SAB Ect Mult Living                 Review of Systems  Constitutional: Negative for fever, chills and diaphoresis.  HENT: Negative for congestion, rhinorrhea and sinus pressure.   Eyes: Positive for photophobia.  Respiratory: Negative for cough.   Gastrointestinal: Positive for nausea. Negative for vomiting and diarrhea.  Neurological: Positive for headaches. Negative for dizziness and light-headedness.  All other systems reviewed and are negative.      Allergies  Review of patient's allergies indicates no known allergies.  Home Medications   Current Outpatient Rx  Name  Route  Sig  Dispense  Refill  . pseudoephedrine-acetaminophen (TYLENOL SINUS) 30-500 MG TABS   Oral   Take 1 tablet by mouth every 4 (four) hours as needed (for congestion).         Marland Kitchen albuterol (VENTOLIN HFA) 108 (90 BASE) MCG/ACT inhaler   Inhalation   Inhale 2 puffs into the lungs every 4 (four) hours as needed. For shortness of breath.          BP 155/95  Pulse 63  Temp(Src) 97.6 F (36.4 C) (Oral)  Resp 19  Ht 5\' 6"  (1.676 m)  Wt 181 lb (82.101 kg)  BMI 29.23 kg/m2  SpO2 100% Physical Exam  Nursing note and vitals reviewed. Constitutional: She is oriented to person, place, and time. She appears well-developed and well-nourished. No distress.  HENT:  Head: Normocephalic and  atraumatic.  Eyes: Conjunctivae and EOM are normal. Pupils are equal, round, and reactive to light.  Neck: Normal range of motion. Neck supple.  No meningeal signs  Cardiovascular: Normal rate and regular rhythm.   No murmur heard. Pulmonary/Chest: Effort normal and breath sounds normal. No respiratory distress. She has no wheezes. She has no rales.  Abdominal: Soft. There is no tenderness. There is no rigidity, no rebound, no guarding, no CVA tenderness and no tenderness at McBurney's point.  Neurological: She is alert and oriented to person, place, and time. She has normal strength. No cranial nerve deficit or sensory deficit. Gait normal.  Skin: Skin is warm and dry. No rash noted.  Psychiatric: She has a normal mood and affect. Her behavior is normal.    ED Course  Procedures   DIAGNOSTIC STUDIES: Oxygen Saturation is 100% on room air.    COORDINATION OF CARE:  Nursing notes reviewed. Vital signs reviewed. Initial pt interview and examination performed.   4:52 AM-patient seen and evaluated. She is well-appearing no acute distress. Normal nonfocal neuro exam. No concerning or red flag symptoms for her headache today. Discussed treatment plan with pt at bedside, which includes migraine cocktail. Pt agrees with plan.   Treatment plan initiated: Medications  metoCLOPramide (REGLAN) injection 10 mg (not administered)  ketorolac (TORADOL) 30 MG/ML injection 30 mg (not administered)  diphenhydrAMINE (BENADRYL) injection 25 mg (not administered)  dexamethasone (DECADRON) injection 10 mg (not administered)       MDM   Final diagnoses:  Headache      Martie Lee, PA-C 07/17/13 [phone]

## 2013-07-17 NOTE — ED Provider Notes (Signed)
7:09 AM Handoff from [place] PA-C at shift change. Patient with headache. She has received migraine cocktail.  Patient is feeling much better and wants to go home. Normal gross neurological exam.  Patient encouraged to rest today, hydrate well, follow up with a PCP if headaches persist.  Patient counseled to return if they have weakness in their arms or legs, slurred speech, trouble walking or talking, confusion, trouble with their balance, or if they have any other concerns. Patient verbalizes understanding and agrees with plan.    Carlisle Cater, PA-C 07/17/13 [phone]

## 2013-07-17 NOTE — ED Notes (Signed)
Patient reports having a headache since Friday that hasn't gone away yet.  She reports laying down at night it gets worse.  She took Tylenol sinus today to help manage, but it did not work.  Left eye feels watery, and has been bothering her since Friday from the pain. Patient is alert and oriented, no complaints of dizziness.

## 2013-07-17 NOTE — Discharge Instructions (Signed)
Please continue to follow up with a primary care provider for continued evaluation and treatment.    Migraine Headache A migraine headache is very bad, throbbing pain on one or both sides of your head. Talk to your doctor about what things may bring on (trigger) your migraine headaches. HOME CARE  Only take medicines as told by your doctor.  Lie down in a dark, quiet room when you have a migraine.  Keep a journal to find out if certain things bring on migraine headaches. For example, write down:  What you eat and drink.  How much sleep you get.  Any change to your diet or medicines.  Lessen how much alcohol you drink.  Quit smoking if you smoke.  Get enough sleep.  Lessen any stress in your life.  Keep lights dim if bright lights bother you or make your migraines worse. GET HELP RIGHT AWAY IF:   Your migraine becomes really bad.  You have a fever.  You have a stiff neck.  You have trouble seeing.  Your muscles are weak, or you lose muscle control.  You lose your balance or have trouble walking.  You feel like you will pass out (faint), or you pass out.  You have really bad symptoms that are different than your first symptoms. MAKE SURE YOU:   Understand these instructions.  Will watch your condition.  Will get help right away if you are not doing well or get worse. Document Released: 02/19/2008 Document Revised: 08/04/2011 Document Reviewed: 01/17/2013 [facility] Patient Information 2014 [place].

## 2013-07-17 NOTE — ED Provider Notes (Signed)
Medical screening examination/treatment/procedure(s) were performed by non-physician practitioner and as supervising physician I was immediately available for consultation/collaboration.  EKG Interpretation   None         Mariea Clonts, MD 07/17/13 [phone]

## 2013-07-18 NOTE — ED Provider Notes (Signed)
No att. providers found   Mariea Clonts, MD 07/18/13 [phone]

## 2013-08-11 ENCOUNTER — Other Ambulatory Visit: Payer: Self-pay | Admitting: Specialist

## 2013-08-11 DIAGNOSIS — G43909 Migraine, unspecified, not intractable, without status migrainosus: Secondary | ICD-10-CM

## 2013-08-17 ENCOUNTER — Ambulatory Visit
Admission: RE | Admit: 2013-08-17 | Discharge: 2013-08-17 | Disposition: A | Discharge status: Home / Self Care | Payer: BC Managed Care – PPO | Source: Ambulatory Visit | Attending: Specialist | Admitting: Specialist

## 2013-08-17 DIAGNOSIS — G43909 Migraine, unspecified, not intractable, without status migrainosus: Secondary | ICD-10-CM

## 2013-12-18 ENCOUNTER — Emergency Department (HOSPITAL_COMMUNITY)
Admission: EM | Admit: 2013-12-18 | Discharge: 2013-12-18 | Disposition: A | Discharge status: Home / Self Care | Payer: BC Managed Care – PPO | Attending: Emergency Medicine | Admitting: Emergency Medicine

## 2013-12-18 ENCOUNTER — Encounter (HOSPITAL_COMMUNITY): Payer: Self-pay | Admitting: Emergency Medicine

## 2013-12-18 DIAGNOSIS — Y9289 Other specified places as the place of occurrence of the external cause: Secondary | ICD-10-CM | POA: Insufficient documentation

## 2013-12-18 DIAGNOSIS — J45909 Unspecified asthma, uncomplicated: Secondary | ICD-10-CM | POA: Insufficient documentation

## 2013-12-18 DIAGNOSIS — Y990 Civilian activity done for income or pay: Secondary | ICD-10-CM | POA: Insufficient documentation

## 2013-12-18 DIAGNOSIS — S0510XA Contusion of eyeball and orbital tissues, unspecified eye, initial encounter: Secondary | ICD-10-CM | POA: Insufficient documentation

## 2013-12-18 DIAGNOSIS — S0501XA Injury of conjunctiva and corneal abrasion without foreign body, right eye, initial encounter: Secondary | ICD-10-CM

## 2013-12-18 DIAGNOSIS — T1591XA Foreign body on external eye, part unspecified, right eye, initial encounter: Secondary | ICD-10-CM

## 2013-12-18 DIAGNOSIS — Z8659 Personal history of other mental and behavioral disorders: Secondary | ICD-10-CM | POA: Insufficient documentation

## 2013-12-18 DIAGNOSIS — T1590XA Foreign body on external eye, part unspecified, unspecified eye, initial encounter: Secondary | ICD-10-CM | POA: Insufficient documentation

## 2013-12-18 DIAGNOSIS — Z8742 Personal history of other diseases of the female genital tract: Secondary | ICD-10-CM | POA: Insufficient documentation

## 2013-12-18 DIAGNOSIS — F17200 Nicotine dependence, unspecified, uncomplicated: Secondary | ICD-10-CM | POA: Insufficient documentation

## 2013-12-18 DIAGNOSIS — S058X9A Other injuries of unspecified eye and orbit, initial encounter: Secondary | ICD-10-CM | POA: Insufficient documentation

## 2013-12-18 MED ORDER — TETRACAINE HCL 0.5 % OP SOLN
1.0000 [drp] | Freq: Once | OPHTHALMIC | Status: DC
Start: 2013-12-18 — End: 2013-12-18
  Filled 2013-12-18: qty 2

## 2013-12-18 MED ORDER — CIPROFLOXACIN HCL 0.3 % OP SOLN: 1.0000 [drp] | OPHTHALMIC | Status: AC

## 2013-12-18 MED ORDER — POLYMYXIN B-TRIMETHOPRIM 10000-0.1 UNIT/ML-% OP SOLN: 1.0000 [drp] | OPHTHALMIC | Status: DC

## 2013-12-18 MED ORDER — FLUORESCEIN SODIUM 1 MG OP STRP
1.0000 | ORAL_STRIP | Freq: Once | OPHTHALMIC | Status: DC
  Filled 2013-12-18: qty 1

## 2013-12-18 NOTE — ED Provider Notes (Signed)
Medical screening examination/treatment/procedure(s) were conducted as a shared visit with non-physician practitioner(s) or resident and myself. I personally evaluated the patient during the encounter and agree with the findings.  I have personally reviewed any xrays and/ or EKG's with the provider and I agree with interpretation.  Patient with foreign body sensation in eye pain since cutting a tree down yesterday. Physician assistant remove small foreign body. Patient has pupils equal bilateral, extra the muscle function intact, mild diffuse conjunctival injection. Plan for eyedrops antibiotics and followup with ophthalmology outpatient.  Foreign body eye, conjunctivitis   Mariea Clonts, MD 12/18/13 [phone]

## 2013-12-18 NOTE — ED Notes (Signed)
Pt moved to eye room in fast track. Visual Acuity exam performed. Right eye: 20/30, left eye: 20/20

## 2013-12-18 NOTE — ED Provider Notes (Signed)
CSN: [id number]     Arrival date & time 12/18/13  0300 History   First MD Initiated Contact with Patient 12/18/13 0541     Chief Complaint  Patient presents with  . Eye Pain   HPI  [name] is a 48 y.o. female with a PMH of asthma, polysubstance abuse, depression, anemia, and menorrhagia who presents to the ED for evaluation of eye pain. History was provided by the patient. Patient states that yesterday she was cutting down a tree and felt something get into her right eye. She was not wearing glasses or eye protection. She states she can feel the sensation of a foreign body in her right upper eye. She has mild blurry vision in the right eye. She has had increased tearing, eye redness, photophobia and pain on the right. She denies any drainage, pain with eye movement, fever, rhinorrhea, congestion, headache, ear pain, sore throat, or any other concerns. No hx of eye problems in the past. Does not wear contacts or glasses.    Past Medical History  Diagnosis Date  . Depression     Follows with [facility], history of voluntary admission to [facility].  History of suisidal ideation with drug od (50 pills of ibuprofen).   . Bronchial asthma   . Tobacco abuse   . History of cocaine abuse     Quit in 2009  . Marijuana abuse     Hx of, quit in 2009  . Alcohol abuse     Hx of, quit in 2009  . Transaminitis     Considered to be secondary to alchol use.   . Adnexal mass 2006    Bilateral ovarian cystic masses- recomended GYN FU.   . Menorrhagia 2006    Endometiral Biopsy- DEGENERATING SECRETORY-TYPE ENDOMETRIUM  . Ankle fracture     Bimalleolar sp closed reduction under floroscopy.   . Normocytic anemia    Past Surgical History  Procedure Laterality Date  . Close reduction of bimalleolar ankle fracture    . Laparoscopic appendectomy  07/10/2011    Procedure: APPENDECTOMY LAPAROSCOPIC;  Surgeon: Rolm Bookbinder, MD;  Location: [facility];  Service: General;  Laterality: N/A;   Family History  Problem  Relation Age of Onset  . Diabetes Mother   . Hypertension Mother   . Cancer Mother     colon  . Hypertension Father   . Diabetes Brother   . Obesity Brother    History  Substance Use Topics  . Smoking status: Current Every Day Smoker -- 0.50 packs/day for 25 years    Types: Cigarettes  . Smokeless tobacco: Not on file  . Alcohol Use: No     Comment: Quit in 09.    OB History   Grav Para Term Preterm Abortions TAB SAB Ect Mult Living                 Review of Systems  Constitutional: Negative for fever, chills, activity change, appetite change and fatigue.  HENT: Negative for congestion, ear pain, rhinorrhea and sore throat.   Eyes: Positive for photophobia, pain, redness and visual disturbance. Negative for discharge and itching.  Gastrointestinal: Negative for nausea and vomiting.  Musculoskeletal: Negative for myalgias.  Neurological: Negative for dizziness, weakness, light-headedness and headaches.    Allergies  Review of patient's allergies indicates no known allergies.  Home Medications   Prior to Admission medications   Medication Sig Start Date End Date Taking? Authorizing Provider  albuterol (VENTOLIN HFA) 108 (90 BASE) MCG/ACT inhaler  Inhale 2 puffs into the lungs every 4 (four) hours as needed. For shortness of breath.    Historical Provider, MD  pseudoephedrine-acetaminophen (TYLENOL SINUS) 30-500 MG TABS Take 1 tablet by mouth every 4 (four) hours as needed (for congestion).    Historical Provider, MD   BP 120/90  Pulse 68  Temp(Src) 98.2 F (36.8 C) (Oral)  Resp 16  SpO2 100%  Filed Vitals:   12/18/13 0315 12/18/13 0653  BP: 120/90 122/97  Pulse: 68 65  Temp: 98.2 F (36.8 C) 97.3 F (36.3 C)  TempSrc: Oral Oral  Resp: 16 16  SpO2: 100% 100%    Physical Exam  Nursing note and vitals reviewed. Constitutional: She is oriented to person, place, and time. She appears well-developed and well-nourished. No distress.  Non-toxic  HENT:  Head:  Normocephalic and atraumatic.  Right Ear: External ear normal.  Left Ear: External ear normal.  Nose: Nose normal.  Mouth/Throat: Oropharynx is clear and moist. No oropharyngeal exudate.  Tympanic membranes gray and translucent bilaterally with no erythema, edema, or hemotympanum.  No mastoid or tragal tenderness bilaterally. No erythema to the posterior pharynx. Tonsils without edema or exudates. Uvula midline. No trismus. No difficulty controlling secretions.   Eyes: EOM are normal. Pupils are equal, round, and reactive to light. Right eye exhibits no discharge. Left eye exhibits no discharge.  Right eye injected throughout. Small 3 mm circular mobile brown firm foreign body in the right upper quadrant of the right eye. Pinpoint stain uptake to the right and left of the right cornea. No pain with eye movement. No periorbital edema or erythema. EOM's intact.   Neck: Normal range of motion. Neck supple.  Cardiovascular: Normal rate.   Pulmonary/Chest: Effort normal.  Abdominal: Soft. She exhibits no distension. There is no tenderness.  Musculoskeletal: Normal range of motion. She exhibits no edema and no tenderness.  Neurological: She is alert and oriented to person, place, and time.  Skin: Skin is warm and dry. She is not diaphoretic.     ED Course  FOREIGN BODY REMOVAL Date/Time: 12/18/2013 6:30 AM Performed by: Vernie Murders K Authorized by: Lucila Maine Consent: Verbal consent obtained. Consent given by: patient Patient identity confirmed: verbally with patient Body area: eye Location details: right eyelid Local anesthetic: tetracaine drops Anesthetic total: 2 drops Patient sedated: no Patient restrained: no Patient cooperative: yes Localization method: eyelid eversion and visualized Removal mechanism: moist cotton swab Eye examined with fluorescein. Fluorescein uptake. Corneal abrasion size: small Corneal abrasion location: medial and lateral No residual rust ring  present. Dressing: eye patch Depth: superficial Complexity: simple 1 objects recovered. Objects recovered: 3 mm circular brown plant-like material Post-procedure assessment: foreign body removed Patient tolerance: Patient tolerated the procedure well with no immediate complications. Comments: No residual eyelid lacerations or abrasions   (including critical care time) Labs Review Labs Reviewed - No data to display  Imaging Review No results found.   EKG Interpretation None      MDM   [name] is a 48 y.o. female with a PMH of asthma, polysubstance abuse, depression, anemia, and menorrhagia who presents to the ED for evaluation of eye pain. Cause of eye pain likely due to a foreign body which caused a small abrasion of the right eye. No evidence of a orbital cellulitis. Patient afebrile and non-toxic in appearance. Visual acuity intact. Patient given eye drops. Instructed to follow-up with an ophthalmologist and wear eye protection at work. Return precautions, discharge instructions, and follow-up  was discussed with the patient before discharge.      Rechecks  6:00 AM = Visual Acuity exam performed. Right eye: 20/30, left eye: 20/20    New Prescriptions   CIPROFLOXACIN (CILOXAN) 0.3 % OPHTHALMIC SOLUTION    Place 1 drop into the right eye every 4 (four) hours. Place one drop in effected eye every 4 hours until follow up with opthalmologist    Final impressions: 1. Foreign body in eye, right, initial encounter   2. Corneal abrasion, right, initial encounter       Mercy Moore PA-C          Lucila Maine, PA-C 12/18/13 [phone]

## 2013-12-18 NOTE — Discharge Instructions (Signed)
Use eye drops for 5 days as directed  Return to the emergency department if you develop any changing/worsening condition, drainage, fever, eye swelling or any other concerns (please read additional information regarding your condition below)    Corneal Abrasion The cornea is the clear covering at the front and center of the eye. When looking at the colored portion of the eye (iris), you are looking through the cornea. This very thin tissue is made up of many layers. The surface layer is a single layer of cells (corneal epithelium) and is one of the most sensitive tissues in the body. If a scratch or injury causes the corneal epithelium to come off, it is called a corneal abrasion. If the injury extends to the tissues below the epithelium, the condition is called a corneal ulcer. CAUSES   Scratches.  Trauma.  Foreign body in the eye. Some people have recurrences of abrasions in the area of the original injury even after it has healed (recurrent erosion syndrome). Recurrent erosion syndrome generally improves and goes away with time. SYMPTOMS   Eye pain.  Difficulty or inability to keep the injured eye open.  The eye becomes very sensitive to light.  Recurrent erosions tend to happen suddenly, first thing in the morning, usually after waking up and opening the eye. DIAGNOSIS  Your health care provider can diagnose a corneal abrasion during an eye exam. Dye is usually placed in the eye using a drop or a small paper strip moistened by your tears. When the eye is examined with a special light, the abrasion shows up clearly because of the dye. TREATMENT   Small abrasions may be treated with antibiotic drops or ointment alone.  A pressure patch may be put over the eye. If this is done, follow your doctor's instructions for when to remove the patch. Do not drive or use machines while the eye patch is on. Judging distances is hard to do with a patch on. If the abrasion becomes infected and  spreads to the deeper tissues of the cornea, a corneal ulcer can result. This is serious because it can cause corneal scarring. Corneal scars interfere with light passing through the cornea and cause a loss of vision in the involved eye. HOME CARE INSTRUCTIONS  Use medicine or ointment as directed. Only take over-the-counter or prescription medicines for pain, discomfort, or fever as directed by your health care provider.  Do not drive or operate machinery if your eye is patched. Your ability to judge distances is impaired.  If your health care provider has given you a follow-up appointment, it is very important to keep that appointment. Not keeping the appointment could result in a severe eye infection or permanent loss of vision. If there is any problem keeping the appointment, let your health care provider know. SEEK MEDICAL CARE IF:   You have pain, light sensitivity, and a scratchy feeling in one eye or both eyes.  Your pressure patch keeps loosening up, and you can blink your eye under the patch after treatment.  Any kind of discharge develops from the eye after treatment or if the lids stick together in the morning.  You have the same symptoms in the morning as you did with the original abrasion days, weeks, or months after the abrasion healed. MAKE SURE YOU:   Understand these instructions.  Will watch your condition.  Will get help right away if you are not doing well or get worse. Document Released: 05/09/2000 Document Revised: 05/17/2013 Document Reviewed:  01/17/2013 ExitCare Patient Information 2015 [place], [place]. This information is not intended to replace advice given to you by your health care provider. Make sure you discuss any questions you have with your health care provider.   Emergency Department Resource Guide 1) Find a Doctor and Pay Out of Pocket Although you won't have to find out who is covered by your insurance plan, it is a good idea to ask around and get  recommendations. You will then need to call the office and see if the doctor you have chosen will accept you as a new patient and what types of options they offer for patients who are self-pay. Some doctors offer discounts or will set up payment plans for their patients who do not have insurance, but you will need to ask so you aren't surprised when you get to your appointment.  2) Contact Your Local Health Department Not all health departments have doctors that can see patients for sick visits, but many do, so it is worth a call to see if yours does. If you don't know where your local health department is, you can check in your phone book. The CDC also has a tool to help you locate your state's health department, and many state websites also have listings of all of their local health departments.  3) Find a [place] [facility] If your illness is not likely to be very severe or complicated, you may want to try a walk in clinic. These are popping up all over the country in pharmacies, drugstores, and shopping centers. They're usually staffed by nurse practitioners or physician assistants that have been trained to treat common illnesses and complaints. They're usually fairly quick and inexpensive. However, if you have serious medical issues or chronic medical problems, these are probably not your best option.  No Primary Care Doctor: - Call Health Connect at  [phone] - they can help you locate a primary care doctor that  accepts your insurance, provides certain services, etc. - Physician Referral Service- [phone]  Chronic Pain Problems: Organization         Address  Phone   Notes  [facility]  [phone] Patients need to be referred by their primary care doctor.   Medication Assistance: Organization         Address  Phone   Notes  [facility] Medication [facility] [address] [place], [place] [address] [phone] --Must be a resident of  [facility] -- Must have NO insurance coverage whatsoever (no Medicaid/ Medicare, etc.) -- The pt. MUST have a primary care doctor that directs their care regularly and follows them in the community   MedAssist  [phone]   [employer]  [phone]    Agencies that provide inexpensive medical care: Organization         Address  Phone   Notes  [place]  [phone]   [facility]    [phone]   [facility] [place], [place] [address] [phone]   [place] [address] [place] [phone]   Planned Parenthood    [phone]   [facility]    [phone]   [place] [address], [place] Phone:  [phone], Fax:  [phone] Hours of Operation:  9 am - 6 pm, M-F.  Also accepts Medicaid/Medicare and self-pay.  [facility]  [place] for [place], [address], [place] Phone: [phone], Fax: [phone]. Hours of Operation:  8:30 am - 5:30 pm, M-F.  Also accepts Medicaid and self-pay.  [facility] [place] [address] [place] Phone: [phone]   [place], [place], [place] [phone], Ext. [phone] Mondays & Thursdays: 7-9 AM.  First 15 patients are seen on a first come, first serve basis.    [place] Providers:  Organization         Address  Phone   Notes  [facility] [place] [address], [place] [phone] Also accepts self-pay patients.  [facility] [address], [place]  [phone]   [place], [address], [place] [phone]   [facility] [address], [place] [phone]   Lucianne Lei [address], [place]   [phone] Only accepts [place] Access  [place] patients after they have their name applied to their card.   Self-Pay (no insurance) in [facility]:  Organization         Address  Phone   Notes  Sickle Cell Patients, [facility] [place] [phone]   [facility] [place] [phone]   [facility] [place]  [place], [place], [place] [phone]   [facility]/Dr. Osei-Bonsu  [address], [place] or [address], [place] [phone] Phone number for both [place] and [place] locations is the same.  [facility] [address], [place] [phone]   [facility] [address], [place] or [address] [phone]   [facility] [address], [place] [phone], phone; [phone], fax Sees patients 1st and 3rd Saturday of every month.  Must not qualify for public or private insurance (i.e. Medicaid, Medicare, [place] Health Choice, Veterans' Benefits)  Household income should be no more than 200% of the poverty level The clinic cannot treat you if you are pregnant or think you are pregnant  Sexually transmitted diseases are not treated at the clinic.    Dental Care: Organization         Address  Phone  Notes  [facility] Department of [facility] [place] [phone] Accepts children up to age 24 who are enrolled in [place] or [place]; pregnant women with a Medicaid card; and children who have applied for Medicaid or [place] Health Choice, but were declined, whose parents can pay a reduced fee at time of service.  [facility] Department of [facility]  [address], [place] [phone] Accepts children up to age 57 who are enrolled in [place] or [place]; pregnant women with a Medicaid  card; and children who have applied for Medicaid or [place] Health Choice, but were declined, whose parents can pay a reduced fee at time of service.  [facility] Adult [facility] Access PROGRAM  [place] [phone] Patients are seen by appointment only. Walk-ins are not accepted. [place] will see patients 35 years of age and older. Monday - Tuesday (8am-5pm) Most Wednesdays (8:30-5pm) $30 per visit,  cash only  [employer] Adult [employer] PROGRAM  [address], [place] [phone] Patients are seen by appointment only. Walk-ins are not accepted. [place] will see patients 41 years of age and older. One Wednesday Evening (Monthly: Volunteer Based).  $30 per visit, cash only  [place]  [phone] for adults; Children under age 10, call Graduate Pediatric Dentistry at [phone]. Children aged 71-14, please call [phone] to request a pediatric application.  Dental services are provided in all areas of dental care including fillings, crowns and bridges, complete and partial dentures, implants, gum treatment, root canals, and extractions. Preventive care is also provided. Treatment is provided to both adults and children. Patients are selected via a lottery and there is often a waiting list.   [facility] [address], [place]  [phone] [email or web address]   [facility] [address] [place], [place] [phone], Ext. [phone] Second and Fourth Thursday of each month, opens at 6:30 AM; Clinic ends at 9 AM.  Patients are seen on a first-come first-served basis, and a limited number are seen during each clinic.   [facility]  [address] [place] Danker [place], [place] [phone]   Eligibility Requirements You must have lived in [place], [place], or [place] counties for at least the last three months.   You cannot be eligible for state or federal sponsored [employer],  including [employer], [place], or [employer].   You generally cannot be eligible for healthcare insurance through your employer.    How to apply: Eligibility screenings are held every Tuesday and Wednesday afternoon from 1:00 pm until 4:00 pm. You do not need an appointment for the interview!  [facility] [address], [place], [place]   [place]  [place] Department  [place]  [phone]    Behavioral Health Resources in the Community: Intensive Outpatient Programs Organization         Address  Phone  Notes  [place]. [address], [place], [place] [phone]   [facility] Outpatient [address] [place], [place]   ADS: Alcohol & Drug Svcs [address], [place], [place]   [place] [address]  [place], [place] or [phone]   Substance Abuse Resources Organization         Address  Phone  Notes  Alcohol and Drug Services  [phone]   [place]  [phone]   The [place]   Chinita Pester  [phone]   Residential & Outpatient Substance Abuse Program  [phone]   Psychological Services Organization         Address  Phone  Notes  [facility] [place]  [place]  [phone]   [place] [address], [place] or [phone]    Mobile Crisis Teams Organization         Address  Phone  Notes  [facility], Mobile Crisis Care Unit  [phone]   [facility]otherapeutic [facility]  [address] [place], [place]   [place] Levels [address], [place] [phone]    Self-Help/Support Groups Organization         Address  Phone  Notes  Mental Health Assoc.  of [place] - variety of support groups  [place] Call for more information  Narcotics Anonymous (NA), [facility] [address], [employer] [place]  2 meetings at this location   Special educational needs teacher         Address  Phone  Notes  [facility] Residential Treatment [place],    [place]  [phone]   [facility]  [address], [place] [address], [place], [place]   Clyde Nibley, [place] [phone] Admissions: 8am-3pm M-F  Incentives Substance [place] [address],    [place], [place] [phone]   [facility] [address] [place], [place], [place]   [facility] [place] [address]  [place], [place]   [facility] - Intensive Outpatient [address], [place], [place]   [facility] [place]  [place], [place] [phone] or [phone]   [facility] (RTS) [address], [place], [place] Accepts Medicaid  [facility] [place] [address]  [place] [phone] Substance Abuse/Addiction Treatment   [facility] Organization         Address  Phone  Notes  [facility]  [phone]   Domenic Schwab, PhD [address] Arlis [place], [place]   [phone] or [phone]   [place], [place] [phone]   [facility] [address], [place], [place] [phone] Insurance/Medicaid/sponsorship through [facility] and Families [address], [address] [place]                                    [place], [place] [phone] [place] [address], [place] [phone]    Dr. Adele Schilder  [phone]   [facility] Dept. 1) [address] [place] 2)  [place] 3)  [address], [place] [phone]  [phone]   [place] [phone] or [phone] (After Hours)

## 2013-12-18 NOTE — ED Notes (Signed)
To ED for eval of right eye pain since cutting a tree down today. Felt like she got something in her eye but pain started later when she was at work. Eye red and appears irritated

## 2014-02-01 ENCOUNTER — Emergency Department (HOSPITAL_COMMUNITY)
Admission: EM | Admit: 2014-02-01 | Discharge: 2014-02-01 | Disposition: A | Discharge status: Home / Self Care | Payer: BC Managed Care – PPO | Source: Home / Self Care | Attending: Family Medicine | Admitting: Family Medicine

## 2014-02-01 ENCOUNTER — Encounter (HOSPITAL_COMMUNITY): Payer: Self-pay | Admitting: Emergency Medicine

## 2014-02-01 DIAGNOSIS — L240 Irritant contact dermatitis due to detergents: Secondary | ICD-10-CM

## 2014-02-01 MED ORDER — TRIAMCINOLONE ACETONIDE 40 MG/ML IJ SUSP
40.0000 mg | Freq: Once | INTRAMUSCULAR | Status: AC
  Administered 2014-02-01: 40 mg via INTRAMUSCULAR

## 2014-02-01 MED ORDER — TRIAMCINOLONE ACETONIDE 40 MG/ML IJ SUSP
INTRAMUSCULAR | Status: AC
  Filled 2014-02-01: qty 1

## 2014-02-01 MED ORDER — FLUTICASONE PROPIONATE 0.05 % EX CREA: TOPICAL_CREAM | Freq: Two times a day (BID) | CUTANEOUS | Status: DC

## 2014-02-01 NOTE — ED Notes (Signed)
C/o insect bite to R lower ant. Leg onset this AM.  She woke up and it was itching and then it started burning and hurting.  Then she noticed red raised, rash on back of her neck and forehead.

## 2014-02-01 NOTE — ED Provider Notes (Signed)
CSN: [id number]     Arrival date & time 02/01/14  1721 History   First MD Initiated Contact with Patient 02/01/14 1729     Chief Complaint  Patient presents with  . Insect Bite   (Consider location/radiation/quality/duration/timing/severity/associated sxs/prior Treatment) Patient is a 48 y.o. female presenting with rash. The history is provided by the patient.  Rash Location:  Head/neck and leg Leg rash location:  R lower leg Quality: dryness and itchiness   Severity:  Mild Onset quality:  Gradual Duration:  12 hours Progression:  Unchanged Chronicity:  New Context: insect bite/sting   Relieved by:  None tried Worsened by:  Nothing tried Ineffective treatments:  None tried Associated symptoms: no shortness of breath, no throat swelling and no tongue swelling     Past Medical History  Diagnosis Date  . Depression     [facility], history of [facility].  History of suisidal ideation with drug od (50 pills of ibuprofen).   . Bronchial asthma   . Tobacco abuse   . History of cocaine abuse     Quit in 2009  . Marijuana abuse     Hx of, quit in 2009  . Alcohol abuse     Hx of, quit in 2009  . Transaminitis     Considered to be secondary to alchol use.   . Adnexal mass 2006    Bilateral ovarian cystic masses- recomended GYN FU.   . Menorrhagia 2006    Endometiral Biopsy- DEGENERATING SECRETORY-TYPE ENDOMETRIUM  . Ankle fracture     Bimalleolar sp closed reduction under floroscopy.   . Normocytic anemia    Past Surgical History  Procedure Laterality Date  . Close reduction of bimalleolar ankle fracture    . Laparoscopic appendectomy  07/10/2011    Procedure: APPENDECTOMY LAPAROSCOPIC;  Surgeon: Rolm Bookbinder, MD;  Location: [facility];  Service: General;  Laterality: N/A;  . Bartholin gland cyst excision  2015   Family History  Problem Relation Age of Onset  . Diabetes Mother   . Hypertension Mother   . Cancer Mother     colon  . Hypertension  Father   . Diabetes Brother   . Obesity Brother    History  Substance Use Topics  . Smoking status: Current Every Day Smoker -- 0.50 packs/day for 25 years    Types: Cigarettes  . Smokeless tobacco: Not on file  . Alcohol Use: No     Comment: Quit in 09.    OB History   Grav Para Term Preterm Abortions TAB SAB Ect Mult Living                 Review of Systems  Constitutional: Negative.   Respiratory: Negative for shortness of breath.   Skin: Positive for rash.    Allergies  Review of patient's allergies indicates no known allergies.  Home Medications   Prior to Admission medications   Medication Sig Start Date End Date Taking? Authorizing Provider  albuterol (VENTOLIN HFA) 108 (90 BASE) MCG/ACT inhaler Inhale 2 puffs into the lungs every 4 (four) hours as needed. For shortness of breath.    Historical Provider, MD  fluticasone (CUTIVATE) 0.05 % cream Apply topically 2 (two) times daily. 02/01/14   Billy Fischer, MD  pseudoephedrine-acetaminophen (TYLENOL SINUS) 30-500 MG TABS Take 1 tablet by mouth every 4 (four) hours as needed (for congestion).    Historical Provider, MD   BP 142/97  Temp(Src) 98.3 F (36.8 C) (Oral)  Resp 16  SpO2 98% Physical Exam  Nursing note and vitals reviewed. Constitutional: She is oriented to person, place, and time. She appears well-developed and well-nourished.  Neurological: She is alert and oriented to person, place, and time.  Skin: Rash noted.  Erythematous papule to right lower leg with diffuse pruritis to back of neck.    ED Course  Procedures (including critical care time) Labs Review Labs Reviewed - No data to display  Imaging Review No results found.   MDM   1. Contact dermatitis due to detergents        Billy Fischer, MD 02/01/14 1750

## 2014-06-02 ENCOUNTER — Other Ambulatory Visit: Payer: Self-pay | Admitting: Obstetrics and Gynecology

## 2014-06-02 DIAGNOSIS — R928 Other abnormal and inconclusive findings on diagnostic imaging of breast: Secondary | ICD-10-CM

## 2014-06-14 ENCOUNTER — Ambulatory Visit
Admission: RE | Admit: 2014-06-14 | Discharge: 2014-06-14 | Disposition: A | Discharge status: Home / Self Care | Payer: 59 | Source: Ambulatory Visit | Attending: Obstetrics and Gynecology | Admitting: Obstetrics and Gynecology

## 2014-06-14 DIAGNOSIS — R928 Other abnormal and inconclusive findings on diagnostic imaging of breast: Secondary | ICD-10-CM

## 2014-06-14 LAB — HM MAMMOGRAPHY

## 2014-06-21 ENCOUNTER — Ambulatory Visit: Payer: Self-pay | Admitting: Medical

## 2014-07-05 ENCOUNTER — Ambulatory Visit: Payer: Self-pay | Admitting: Medical

## 2014-07-06 ENCOUNTER — Telehealth: Payer: Self-pay | Admitting: Medical

## 2014-07-06 ENCOUNTER — Ambulatory Visit (INDEPENDENT_AMBULATORY_CARE_PROVIDER_SITE_OTHER): Payer: 59 | Admitting: Medical

## 2014-07-06 ENCOUNTER — Ambulatory Visit: Payer: Self-pay | Admitting: Medical

## 2014-07-06 ENCOUNTER — Encounter: Payer: Self-pay | Admitting: Medical

## 2014-07-06 ENCOUNTER — Ambulatory Visit
Admission: RE | Admit: 2014-07-06 | Discharge: 2014-07-06 | Disposition: A | Discharge status: Home / Self Care | Payer: 59 | Source: Ambulatory Visit | Attending: Medical | Admitting: Medical

## 2014-07-06 VITALS — BP 120/68 | HR 72 | Temp 98.1°F | Resp 15 | Ht 66.5 in | Wt 166.0 lb

## 2014-07-06 DIAGNOSIS — G8929 Other chronic pain: Secondary | ICD-10-CM

## 2014-07-06 DIAGNOSIS — M549 Dorsalgia, unspecified: Secondary | ICD-10-CM

## 2014-07-06 DIAGNOSIS — R202 Paresthesia of skin: Secondary | ICD-10-CM

## 2014-07-06 DIAGNOSIS — M25512 Pain in left shoulder: Secondary | ICD-10-CM

## 2014-07-06 DIAGNOSIS — M79605 Pain in left leg: Secondary | ICD-10-CM

## 2014-07-06 DIAGNOSIS — M79602 Pain in left arm: Secondary | ICD-10-CM

## 2014-07-06 DIAGNOSIS — R358 Other polyuria: Secondary | ICD-10-CM

## 2014-07-06 DIAGNOSIS — M546 Pain in thoracic spine: Secondary | ICD-10-CM

## 2014-07-06 DIAGNOSIS — R631 Polydipsia: Secondary | ICD-10-CM

## 2014-07-06 DIAGNOSIS — R51 Headache: Secondary | ICD-10-CM

## 2014-07-06 DIAGNOSIS — R3589 Other polyuria: Secondary | ICD-10-CM

## 2014-07-06 LAB — POCT URINALYSIS DIPSTICK
BILIRUBIN UA: NEGATIVE
Blood, UA: NEGATIVE
GLUCOSE UA: NEGATIVE
KETONES UA: NEGATIVE
NITRITE UA: NEGATIVE
Protein, UA: NEGATIVE
Spec Grav, UA: >=1.03
Urobilinogen, UA: NEGATIVE
pH, UA: 6

## 2014-07-06 NOTE — Telephone Encounter (Signed)
I do not have access yet to refer this.

## 2014-07-06 NOTE — Telephone Encounter (Signed)
Needs referral to Dr. Domingo Cocking, Headache and [place].  She already sees him but needs insurance referral

## 2014-07-06 NOTE — Progress Notes (Signed)
Subjective: Here as a new patient today.  Sees Dr. Domingo Cocking q15mo for headaches, needs insurance referral from a PCM back to him.  Needs primary care provider per insurer.  Sees gynecology Dr. Corinna Capra.  She reports some left arm pain.   Gets tingling in left arm all the way through the shoulder.  Denies particular injury or fall.  Been having problems with left arm for several weeks . Using salve on the arm without improvement.   Drives a forklift with left hand, left handed.   She does note some neck pain.   Has to drive in reverse often, so has to turn neck often.  Gets numbness of left 3rd and 4th fingers.   Gets numbness under left upper arm and shoulder blade.  Gets shooting pains that starts at shoulder down to arm.  Neck pain is associated with the arm.   Denies specific shoulder problems.  No chest pain, no SOB, no edema.  Has frequent urinary, dry mouth x months.  Has family hx/o diabetes, wants to be checked for this.    Objective: BP 120/68 mmHg  Pulse 72  Temp(Src) 98.1 F (36.7 C) (Oral)  Resp 15  Ht 5' 6.5" (1.689 m)  Wt 166 lb (75.297 kg)  BMI 26.39 kg/m2  Gen: wdwn, nad, lean AA female Skin: unremarkable Back: tender left upper back Neck: mildly deceased left rotation and lateral flexion, mild pain with this, otherwise ROM normal, nontender, no mass, no thyromegaly MSK: tender throughout left shoulder mildly, no laxity, no deformity, no specific AJ joint tenderness, mild tenderness left upper arm generalized, no hand or lower arm tenderness, otherwise bilat UE unremarkable Pulses normal UE No edema Neuro: normal arm strength, sensation, DTRs   Assessment: Encounter Diagnoses  Name Primary?  . Chronic nonintractable headache, unspecified headache type Yes  . Arm pain, diffuse, left   . Paresthesia of arm   . Shoulder pain, left   . Upper back pain on left side   . Polydipsia   . Polyuria     Plan: Chronic headache - referral back to Dr. Domingo Cocking who she already sees.     Arm pain, paresthesias, shoulder pain, upper back pain - likely more inflammation of upper back muscles given her type of work, but can't rule out cervical radiculopathy or concurrent shoulder issue.  Doubt carpal tunnel syndrome.   Will send for xrays C spine and left shoulder  Polydipsia, polyuria - UA today, return soon for full physical and fasting labs

## 2014-07-06 NOTE — Telephone Encounter (Signed)
I have done this cmr

## 2014-07-06 NOTE — Addendum Note (Signed)
Addended by: Louie Bun on: 07/06/2014 11:41 AM   Modules accepted: Orders

## 2014-07-07 ENCOUNTER — Emergency Department (HOSPITAL_COMMUNITY)
Admission: EM | Admit: 2014-07-07 | Discharge: 2014-07-08 | Disposition: A | Discharge status: Home / Self Care | Payer: 59 | Attending: Emergency Medicine | Admitting: Emergency Medicine

## 2014-07-07 ENCOUNTER — Encounter (HOSPITAL_COMMUNITY): Payer: Self-pay | Admitting: Emergency Medicine

## 2014-07-07 DIAGNOSIS — Z8781 Personal history of (healed) traumatic fracture: Secondary | ICD-10-CM | POA: Insufficient documentation

## 2014-07-07 DIAGNOSIS — J45909 Unspecified asthma, uncomplicated: Secondary | ICD-10-CM | POA: Insufficient documentation

## 2014-07-07 DIAGNOSIS — Z8742 Personal history of other diseases of the female genital tract: Secondary | ICD-10-CM | POA: Diagnosis not present

## 2014-07-07 DIAGNOSIS — Z862 Personal history of diseases of the blood and blood-forming organs and certain disorders involving the immune mechanism: Secondary | ICD-10-CM | POA: Diagnosis not present

## 2014-07-07 DIAGNOSIS — G43909 Migraine, unspecified, not intractable, without status migrainosus: Secondary | ICD-10-CM | POA: Diagnosis not present

## 2014-07-07 DIAGNOSIS — M5441 Lumbago with sciatica, right side: Secondary | ICD-10-CM

## 2014-07-07 DIAGNOSIS — Z720 Tobacco use: Secondary | ICD-10-CM | POA: Diagnosis not present

## 2014-07-07 DIAGNOSIS — M545 Low back pain: Secondary | ICD-10-CM | POA: Diagnosis present

## 2014-07-07 DIAGNOSIS — Z8659 Personal history of other mental and behavioral disorders: Secondary | ICD-10-CM | POA: Diagnosis not present

## 2014-07-07 NOTE — ED Notes (Signed)
Pt. woke up this morning with right low back pain worse with movement and when changing positions , denies injury , strenuous activity or fall , no hematuria , mild dysuria .

## 2014-07-07 NOTE — ED Notes (Signed)
Pt st's she woke up yesterday with pain in right lower back radiating into right buttock.  No known injury

## 2014-07-07 NOTE — ED Provider Notes (Signed)
CSN: [id number]     Arrival date & time 07/07/14  2232 History  This chart was scribed for non-physician practitioner, Abigail Butts, PA-C, working with Julianne Rice, MD, by Delphia Grates, ED Scribe. This patient was seen in room TR08C/TR08C and the patient's care was started at 11:32 PM.    Chief Complaint  Patient presents with  . Back Pain    The history is provided by the patient and medical records. No language interpreter was used.     HPI Comments: [name] is a 49 y.o. female who presents to the Emergency Department complaining of constant, 10/10, lower back pain that began yesterday morning. Patient states the pain radiates to the right buttock upon standing. She denies history of prior similar episodes or history of back problems. She denies any recent heavy lifting, falls, injury, or trauma. Patient has not taken anything for pain relief, stating she has been in bed since onset. She reports history of migraines and takes medication for this as needed. There is associated dysuria beginning tonight. She denies hematuria, malodorous urine, bowel/bladder incontinence, saddle anesthesia, numbness/weakness of the extremities. Patient states she is not sexually active. Patient denies history of cancer, back surgery, or IV drug use. Patient is currently not on any anticoagulants. LNMP over a year ago.    Past Medical History  Diagnosis Date  . Depression     Follows with [facility], history of voluntary admission to [facility].  History of suisidal ideation with drug od (50 pills of ibuprofen).   . Bronchial asthma   . Tobacco abuse   . History of cocaine abuse     Quit in 2009  . Marijuana abuse     Hx of, quit in 2009  . Alcohol abuse     Hx of, quit in 2009  . Transaminitis     Considered to be secondary to alchol use.   . Adnexal mass 2006    Bilateral ovarian cystic masses- recomended GYN FU.   . Menorrhagia 2006    Endometiral Biopsy- DEGENERATING SECRETORY-TYPE  ENDOMETRIUM  . Ankle fracture     Bimalleolar sp closed reduction under floroscopy.   . Normocytic anemia    Past Surgical History  Procedure Laterality Date  . Close reduction of bimalleolar ankle fracture    . Laparoscopic appendectomy  07/10/2011    Procedure: APPENDECTOMY LAPAROSCOPIC;  Surgeon: Rolm Bookbinder, MD;  Location: [facility]S;  Service: General;  Laterality: N/A;  . Bartholin gland cyst excision  2015   Family History  Problem Relation Age of Onset  . Diabetes Mother   . Hypertension Mother   . Cancer Mother     colon  . Hypertension Father   . Diabetes Brother   . Obesity Brother    History  Substance Use Topics  . Smoking status: Current Every Day Smoker -- 0.50 packs/day for 25 years    Types: Cigarettes  . Smokeless tobacco: Not on file  . Alcohol Use: No     Comment: Quit in 09.    OB History    No data available     Review of Systems  Constitutional: Negative for fever and fatigue.  Respiratory: Negative for chest tightness and shortness of breath.   Cardiovascular: Negative for chest pain.  Gastrointestinal: Negative for nausea, vomiting, abdominal pain and diarrhea.  Genitourinary: Negative for dysuria, urgency, frequency and hematuria.  Musculoskeletal: Positive for back pain and gait problem ( 2/2 pain). Negative for joint swelling, neck pain and neck stiffness.  Skin: Negative for rash.  Neurological: Negative for weakness, light-headedness, numbness and headaches.  All other systems reviewed and are negative.     Allergies  Review of patient's allergies indicates no known allergies.  Home Medications   Prior to Admission medications   Medication Sig Start Date End Date Taking? Authorizing Provider  zonisamide (ZONEGRAN) 100 MG capsule Take 200 mg by mouth daily. 06/20/14  Yes Historical Provider, MD  AFLURIA PRESERVATIVE FREE 0.5 ML SUSY  03/30/14   Historical Provider, MD  albuterol (VENTOLIN HFA) 108 (90 BASE) MCG/ACT inhaler Inhale 2  puffs into the lungs every 4 (four) hours as needed. For shortness of breath.    Historical Provider, MD  baclofen (LIORESAL) 10 MG tablet Take 10 mg by mouth daily.  05/10/14   Historical Provider, MD  fluticasone (CUTIVATE) 0.05 % cream Apply topically 2 (two) times daily. Patient not taking: Reported on 07/06/2014 02/01/14   Billy Fischer, MD  HYDROcodone-acetaminophen (NORCO/VICODIN) 5-325 MG per tablet Take 1-2 tablets by mouth every 6 (six) hours as needed for moderate pain or severe pain. 07/08/14   Deysha Cartier, PA-C  methocarbamol (ROBAXIN) 500 MG tablet Take 1 tablet (500 mg total) by mouth 2 (two) times daily. 07/08/14   Zedric Deroy, PA-C  naproxen (NAPROSYN) 500 MG tablet Take 1 tablet (500 mg total) by mouth 2 (two) times daily with a meal. 07/08/14   Pinchas Reither, PA-C  pseudoephedrine-acetaminophen (TYLENOL SINUS) 30-500 MG TABS Take 1 tablet by mouth every 4 (four) hours as needed (for congestion).    Historical Provider, MD   Triage Vitals: BP 131/78 mmHg  Pulse 78  Temp(Src) 97.6 F (36.4 C) (Oral)  Resp 18  SpO2 98%  Physical Exam  Constitutional: She appears well-developed and well-nourished. No distress.  HENT:  Head: Normocephalic and atraumatic.  Mouth/Throat: Oropharynx is clear and moist. No oropharyngeal exudate.  Eyes: Conjunctivae are normal.  Neck: Normal range of motion. Neck supple.  Full ROM without pain No midline or paraspinal tenderness  Cardiovascular: Normal rate, regular rhythm, normal heart sounds and intact distal pulses.   No murmur heard. Pulmonary/Chest: Effort normal and breath sounds normal. No respiratory distress. She has no wheezes.  Abdominal: Soft. Bowel sounds are normal. She exhibits no distension. There is no tenderness.  Soft and nontender No palpable or pulsatile masses  Musculoskeletal: She exhibits tenderness.  Slightly decreased range of motion of the T-spine and L-spine Tenderness to palpation of the spinous  processes of the lower T-spine or L-spine Tenderness to palpation of the paraspinous muscles of the T-spine and L-spine  Lymphadenopathy:    She has no cervical adenopathy.  Neurological: She is alert. She has normal reflexes. She exhibits normal muscle tone. Coordination normal.  Reflex Scores:      Bicep reflexes are 2+ on the right side and 2+ on the left side.      Brachioradialis reflexes are 2+ on the right side and 2+ on the left side.      Patellar reflexes are 2+ on the right side and 2+ on the left side.      Achilles reflexes are 2+ on the right side and 2+ on the left side. Speech is clear and goal oriented, follows commands Normal 5/5 strength in upper and lower extremities bilaterally including dorsiflexion and plantar flexion, strong and equal grip strength Sensation normal to light and sharp touch Moves extremities without ataxia, coordination intact Antalgic gait Normal balance No Clonus   Skin: Skin is warm  and dry. No rash noted. She is not diaphoretic. No erythema.  Psychiatric: She has a normal mood and affect. Her behavior is normal.  Nursing note and vitals reviewed.   ED Course  Procedures (including critical care time)  DIAGNOSTIC STUDIES: Oxygen Saturation is 98% on room air, normal by my interpretation.    COORDINATION OF CARE: At 2342 Discussed treatment plan with patient. Patient agrees.   Labs Review Labs Reviewed  URINALYSIS, ROUTINE W REFLEX MICROSCOPIC    Imaging Review Dg Cervical Spine Complete  07/06/2014   CLINICAL DATA:  Neck and left shoulder pain for the past month with numbness in the left hand; no history of trauma ; initial visit  EXAM: CERVICAL SPINE  4+ VIEWS  COMPARISON:  None.  FINDINGS: The cervical vertebral bodies are preserved in height. There is moderate disc space narrowing at C4-5 and C5-6. There are prominent anterior endplate osteophytes at these levels. There is mild facet joint hypertrophy at multiple levels bilateral.  The oblique views reveal moderate bony encroachment upon the neural foramina at multiple mid cervical levels. The odontoid is intact. There is mild narrowing of the atlanto-dens joint space. The prevertebral soft tissue spaces are normal.  IMPRESSION: 1. There is disc space narrowing at C4-5 and C5-6 consistent with osteoarthritic change. There is multilevel mild facet joint hypertrophy. 2. There is bony neural foraminal encroachment at multiple mid cervical levels bilaterally. This may be impacting the nerve roots. 3. Cervical spine MRI is recommended. .   Electronically Signed   By: David  Martinique   On: 07/06/2014 15:59   Dg Thoracic Spine 2 View  07/08/2014   CLINICAL DATA:  Back pain for 2 days, no injury. Pain wraps around to anterior abdomen.  EXAM: THORACIC SPINE - 2 VIEW  COMPARISON:  Chest radiograph November 30, 2007  FINDINGS: There is no evidence of thoracic spine fracture. Alignment is normal. No other significant bone abnormalities are identified.  Degenerative change of the included cervical spine.  IMPRESSION: Negative.   Electronically Signed   By: Elon Alas   On: 07/08/2014 01:58   Dg Lumbar Spine Complete  07/08/2014   CLINICAL DATA:  Back pain for 2 days, no injury. Pain wraps around to anterior abdomen.  EXAM: LUMBAR SPINE - COMPLETE 4+ VIEW  COMPARISON:  CT of the abdomen and pelvis May 16, 2012  FINDINGS: Lumbar vertebral bodies and posterior elements appear intact and aligned, straightened lumbar lordosis. No pars interarticularis defects. Moderate to severe L4-5 disc height loss, endplate sclerosis and marginal spurring consistent with degenerative disc, mild at L3-4. No destructive bony lesions. Sacroiliac joints are symmetric. Prevertebral and paraspinal soft tissue planes are nonsuspicious, at least mild amount of retained large bowel stool.  IMPRESSION: Straightened lumbar lordosis without acute fracture deformity or malalignment. Lower lumbar degenerative discs as seen on  prior CT.   Electronically Signed   By: Elon Alas   On: 07/08/2014 01:56   Dg Shoulder Left  07/06/2014   CLINICAL DATA:  Neck and left shoulder pain for the past month with hand numbness; no history of trauma ; initial visit.  EXAM: LEFT SHOULDER - 2+ VIEW  COMPARISON:  None.  FINDINGS: The bones of the left shoulder are adequately mineralized. There is minimal narrowing of the AC joint. There is a small spur from the superior margin of the bony glenoid. The subacromial subdeltoid space is normal. The observed portions of the left clavicle and upper left ribs are normal.  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 There is no acute bony abnormality.   Electronically Signed   By: David  Martinique   On: 07/06/2014 15:56     EKG Interpretation None      MDM   Final diagnoses:  Bilateral low back pain with right-sided sciatica   [name] presents with lower back pain radiating into the right buttock but not down the right leg.  No neurological deficits and normal neuro exam.  Patient can walk but states is painful.  No loss of bowel or bladder control.  No concern for cauda equina.  No fever, night sweats, weight loss, h/o cancer, IVDU.  Pt x-rays without evidence of compression fracture.  No evidence of UTI on UA.  No abd pain and no TTP of the abd.  RICE protocol and pain medicine indicated and discussed with patient.   I have personally reviewed patient's vitals, nursing note and any pertinent labs or imaging.  I performed an focused physical exam; undressed when appropriate .    It has been determined that no acute conditions requiring further emergency intervention are present at this time. The patient/guardian have been advised of the diagnosis and plan. I reviewed any labs and imaging including any potential incidental findings. We have discussed signs and symptoms that warrant return to the ED and they are listed in the discharge  instructions.    Vital signs are stable at discharge.   BP 131/78 mmHg  Pulse 78  Temp(Src) 97.6 F (36.4 C) (Oral)  Resp 18  SpO2 98%  I personally performed the services described in this documentation, which was scribed in my presence. The recorded information has been reviewed and is accurate.        Jarrett Soho Bradely Rudin, PA-C 07/08/14 0216  Julianne Rice, MD 07/08/14 [phone]

## 2014-07-08 ENCOUNTER — Emergency Department (HOSPITAL_COMMUNITY): Payer: 59

## 2014-07-08 LAB — URINALYSIS, ROUTINE W REFLEX MICROSCOPIC
Bilirubin Urine: NEGATIVE
Glucose, UA: NEGATIVE mg/dL
HGB URINE DIPSTICK: NEGATIVE
Ketones, ur: NEGATIVE mg/dL
Leukocytes, UA: NEGATIVE
Nitrite: NEGATIVE
Protein, ur: NEGATIVE mg/dL
Specific Gravity, Urine: 1.025 (ref 1.005–1.030)
Urobilinogen, UA: 0.2 mg/dL (ref 0.0–1.0)
pH: 5 (ref 5.0–8.0)

## 2014-07-08 MED ORDER — OXYCODONE-ACETAMINOPHEN 5-325 MG PO TABS
2.0000 | ORAL_TABLET | Freq: Once | ORAL | Status: AC
  Administered 2014-07-08: 2 via ORAL
  Filled 2014-07-08: qty 2

## 2014-07-08 MED ORDER — HYDROCODONE-ACETAMINOPHEN 5-325 MG PO TABS: 1.0000 | ORAL_TABLET | Freq: Four times a day (QID) | ORAL | Status: DC | PRN

## 2014-07-08 MED ORDER — DIAZEPAM 5 MG PO TABS
10.0000 mg | ORAL_TABLET | Freq: Once | ORAL | Status: AC
  Administered 2014-07-08: 10 mg via ORAL
  Filled 2014-07-08: qty 2

## 2014-07-08 MED ORDER — NAPROXEN 500 MG PO TABS: 500.0000 mg | ORAL_TABLET | Freq: Two times a day (BID) | ORAL | Status: DC

## 2014-07-08 MED ORDER — METHOCARBAMOL 500 MG PO TABS: 500.0000 mg | ORAL_TABLET | Freq: Two times a day (BID) | ORAL | Status: DC

## 2014-07-08 NOTE — Discharge Instructions (Signed)
1. Medications: robaxin, naproxyn, vicodin, usual home medications 2. Treatment: rest, drink plenty of fluids, gentle stretching as discussed, alternate ice and heat 3. Follow Up: Please followup with your primary doctor in 3 days for discussion of your diagnoses and further evaluation after today's visit; if you do not have a primary care doctor use the resource guide provided to find one;  Return to the ER for worsening back pain, difficulty walking, loss of bowel or bladder control or other concerning symptoms     Back Exercises Back exercises help treat and prevent back injuries. The goal of back exercises is to increase the strength of your abdominal and back muscles and the flexibility of your back. These exercises should be started when you no longer have back pain. Back exercises include:  Pelvic Tilt. Lie on your back with your knees bent. Tilt your pelvis until the lower part of your back is against the floor. Hold this position 5 to 10 sec and repeat 5 to 10 times.  Knee to Chest. Pull first 1 knee up against your chest and hold for 20 to 30 seconds, repeat this with the other knee, and then both knees. This may be done with the other leg straight or bent, whichever feels better.  Sit-Ups or Curl-Ups. Bend your knees 90 degrees. Start with tilting your pelvis, and do a partial, slow sit-up, lifting your trunk only 30 to 45 degrees off the floor. Take at least 2 to 3 seconds for each sit-up. Do not do sit-ups with your knees out straight. If partial sit-ups are difficult, simply do the above but with only tightening your abdominal muscles and holding it as directed.  Hip-Lift. Lie on your back with your knees flexed 90 degrees. Push down with your feet and shoulders as you raise your hips a couple inches off the floor; hold for 10 seconds, repeat 5 to 10 times.  Back arches. Lie on your stomach, propping yourself up on bent elbows. Slowly press on your hands, causing an arch in your low  back. Repeat 3 to 5 times. Any initial stiffness and discomfort should lessen with repetition over time.  Shoulder-Lifts. Lie face down with arms beside your body. Keep hips and torso pressed to floor as you slowly lift your head and shoulders off the floor. Do not overdo your exercises, especially in the beginning. Exercises may cause you some mild back discomfort which lasts for a few minutes; however, if the pain is more severe, or lasts for more than 15 minutes, do not continue exercises until you see your caregiver. Improvement with exercise therapy for back problems is slow.  See your caregivers for assistance with developing a proper back exercise program. Document Released: 06/19/2004 Document Revised: 08/04/2011 Document Reviewed: 03/13/2011 [facility] Patient Information 2015 [place], [place]. This information is not intended to replace advice given to you by your health care provider. Make sure you discuss any questions you have with your health care provider.    Emergency Department Resource Guide 1) Find a Doctor and Pay Out of Pocket Although you won't have to find out who is covered by your insurance plan, it is a good idea to ask around and get recommendations. You will then need to call the office and see if the doctor you have chosen will accept you as a new patient and what types of options they offer for patients who are self-pay. Some doctors offer discounts or will set up payment plans for their patients who do not  have insurance, but you will need to ask so you aren't surprised when you get to your appointment.  2) Contact Your Local Health Department Not all health departments have doctors that can see patients for sick visits, but many do, so it is worth a call to see if yours does. If you don't know where your local health department is, you can check in your phone book. The CDC also has a tool to help you locate your state's health department, and many state websites also have  listings of all of their local health departments.  3) Find a [facility] If your illness is not likely to be very severe or complicated, you may want to try a walk in clinic. These are popping up all over the country in pharmacies, drugstores, and shopping centers. They're usually staffed by nurse practitioners or physician assistants that have been trained to treat common illnesses and complaints. They're usually fairly quick and inexpensive. However, if you have serious medical issues or chronic medical problems, these are probably not your best option.  No Primary Care Doctor: - Call Health Connect at  [phone] - they can help you locate a primary care doctor that  accepts your insurance, provides certain services, etc. - Physician Referral Service- [phone]  Chronic Pain Problems: Organization         Address  Phone   Notes  [facility]  [phone] Patients need to be referred by their primary care doctor.   Medication Assistance: Organization         Address  Phone   Notes  [facility] Medication [facility] [address] [place], [place] [address] [phone] --Must be a resident of [facility] -- Must have NO insurance coverage whatsoever (no Medicaid/ Medicare, etc.) -- The pt. MUST have a primary care doctor that directs their care regularly and follows them in the community   MedAssist  [phone]   [employer]  [phone]    Agencies that provide inexpensive medical care: Organization         Address  Phone   Notes  [place]  [phone]   [facility]    [phone]   [facility] [place], [place] [address] [phone]   [place] [address], [place] [phone]   Planned Parenthood    [phone]   [facility]    [phone]   [place] and [place]  [address], [place] Phone:  [phone], Fax:  [phone] Hours of Operation:  9 am - 6 pm, M-F.  Also accepts Medicaid/Medicare and self-pay.  [facility] for [place] [address], [address], [place] Phone: [phone], Fax: [phone]. Hours of Operation:  8:30 am - 5:30 pm, M-F.  Also accepts Medicaid and self-pay.  [facility] [place] [address] [place] Phone: [phone]   [place], [place], [place] [phone], Ext. [phone] Mondays & Thursdays: 7-9 AM.  First 15 patients are seen on a first come, first serve basis.    [place] Providers:  Organization         Address  Phone   Notes  [facility] [address], [place] [phone] Also accepts self-pay patients.  [place]  [address] Dolores Patty [place], [place]  [phone]   [place], [address], [place] [phone]   [place] [address], [place] [phone]   Lucianne Lei [address], [place]   [phone] Only accepts [place] Access [place] patients after they have their name applied to their card.   Self-Pay (no insurance) in [facility]:  Organization         Address  Phone   Notes  Sickle Cell Patients, [facility] Internal Medicine [place] [phone]   [facility] Urgent Care [place] [phone]   [facility] [place]  [place], [place], [place] [phone]   [facility]/Dr. Osei-Bonsu  [address], [place] or [address], [place] [phone] Phone number for both [place] and [place] locations is the same.  Urgent Medical and [facility] [address] [place] [phone]   [facility] [address]  [place] or [address] [phone]   [facility] [address] [place] [phone], phone; [phone], fax Sees patients 1st and 3rd Saturday of every month.  Must not qualify for public or private insurance (i.e. Medicaid, Medicare, [place] Health Choice, Veterans' Benefits)  Household income should be no more than 200% of the poverty level The clinic cannot treat you if you are pregnant or think you are pregnant  Sexually transmitted diseases are not treated at the clinic.    Dental Care: Organization         Address  Phone  Notes  [facility] Department of [facility] [place] [phone] Accepts children up to age 38 who are enrolled in [place] or [place]; pregnant women with a Medicaid card; and children who have applied for Medicaid or [place] Health Choice, but were declined, whose parents can pay a reduced fee at time of service.  [facility] Department of [facility]  [address], [place] [phone] Accepts children up to age 80 who are enrolled in [place] or [place]; pregnant women with a Medicaid card; and children who have applied for Medicaid or [place] Health Choice, but were declined, whose parents can pay a reduced fee at time of service.  [place] Adult Dental Access PROGRAM  [place] [phone] Patients are seen by appointment only. Walk-ins are not accepted. [place] will see patients 33 years of age and older. Monday - Tuesday (8am-5pm) Most Wednesdays (8:30-5pm) $30 per visit, cash only  [facility] Adult [facility] Access PRO[facility]  [address], [facility] [phone] Patients are seen by appointment only. Walk-ins are not accepted. [place] will see patients 1 years of age and older. One Wednesday Evening (Monthly: Volunteer Based).  $30 per visit, cash only  [place]  [phone] for adults; Children under age 64, call Graduate Pediatric Dentistry at [phone]. Children aged 36-14, please call [phone] to request a pediatric application.  Dental services are provided in all areas of dental care including fillings, crowns and bridges, complete and partial dentures, implants, gum treatment, root canals, and extractions. Preventive care is also provided. Treatment is provided to both adults and children. Patients are selected  via a lottery and there is often a waiting list.   [facility] [address] [place]  [phone] [email or web address]   [facility] [address] [place], [place] [phone], Ext. [phone] Second and Fourth Thursday of each month, opens at 6:30 AM; Clinic ends at 9 AM.  Patients are seen on a first-come first-served basis, and a limited number are seen during each clinic.   [facility]  [address] Hillard Danker [place], [place] [phone]   Eligibility Requirements You must have lived in [place], [place], or [place] counties for at least the last three months.   You cannot be eligible for state or federal sponsored [employer], including [employer], [place], or [employer].   You generally cannot be eligible for healthcare insurance through your employer.    How to apply: Eligibility screenings are held every Tuesday and Wednesday afternoon from 1:00 pm until 4:00 pm. You do not need an appointment for the interview!  [facility] [address], [place], [place]   [place]  [place] Department  [place]  [phone]    Behavioral Health Resources in the Community: Intensive Outpatient Programs Organization         Address  Phone  Notes  [place] [employer]. [address] [place], [place] [phone]     [facility] [address], [place], [place]   ADS: Alcohol & Drug Svcs [address], [place], [place]   [place] [address],  [place], [place] or [phone]   Substance Abuse Resources Organization         Address  Phone  Notes  Alcohol and Drug Services  [phone]   [place]  [phone]   [facility] [place]   Chinita Pester  [phone]   Residential & Outpatient Substance Abuse Program  [phone]   Psychological Services Organization         Address  Phone  Notes  [facility] [place]  [place]  [phone]   [place] [address], [place] or [phone]    Mobile Crisis Teams Organization         Address  Phone  Notes  [facility], Mobile Crisis Care Unit  [phone]   Assertive Psychotherapeutic Services  [address] [place], [place]   [place] [address], [place] [phone]    Self-Help/Support Groups Organization         Address  Phone             Notes  [place]. of [place] - variety of support groups  [place] Call for more information  Narcotics Anonymous (NA), [facility] [address], [employer] [place]  2 meetings at this location   Special educational needs teacher         Address  Phone  Notes  [facility]AP Residential Treatment [place],    [place]  [phone]   [facility]  [address], [place] [phone], [place], [place]   [place], [place] [phone] Admissions: 8am-3pm M-F  Incentives Substance [place] [address]    [place], [place]   The [place]  Jadene Pierini [place], [place]   [facility] [address],  [place],  [place]   [facility] - Intensive Outpatient [address] Kristeen Mans [address], [place], [place]   [facility] (Anna.) [place].,  [place], [place] [phone] or [phone]   [facility]idential Treatment Services (RTS) [address], [place], [place] Accepts Medicaid  [place] [address],  [place] [phone] Substance Abuse/Addiction Treatment   [facility] Organization         Address  Phone  Notes  [facility]  [phone]   Domenic Schwab, PhD [address] Arlis Porta [place], [place]   [phone] or [phone]   [place], [place] [phone]   [facility] [address], [place], [place] [phone] Insurance/Medicaid/sponsorship through [facility] and Families [address]                                    [place], [place] [phone] [place] [address] [place], [place] [phone]    Dr. Adele Schilder  [phone]   [facility] Dept. 1) [address], [place] 2) [place] 3)  [address], [place] [phone]  [phone]   [place] [phone] or [phone] (After Hours)

## 2014-07-17 ENCOUNTER — Encounter (HOSPITAL_COMMUNITY): Payer: Self-pay

## 2014-07-17 ENCOUNTER — Encounter (HOSPITAL_COMMUNITY)
Admission: RE | Admit: 2014-07-17 | Discharge: 2014-07-17 | Disposition: A | Discharge status: Home / Self Care | Payer: 59 | Source: Ambulatory Visit | Attending: Obstetrics and Gynecology | Admitting: Obstetrics and Gynecology

## 2014-07-17 DIAGNOSIS — D259 Leiomyoma of uterus, unspecified: Secondary | ICD-10-CM | POA: Diagnosis not present

## 2014-07-17 DIAGNOSIS — F1210 Cannabis abuse, uncomplicated: Secondary | ICD-10-CM | POA: Diagnosis not present

## 2014-07-17 DIAGNOSIS — F17210 Nicotine dependence, cigarettes, uncomplicated: Secondary | ICD-10-CM | POA: Diagnosis not present

## 2014-07-17 DIAGNOSIS — N803 Endometriosis of pelvic peritoneum: Secondary | ICD-10-CM | POA: Diagnosis not present

## 2014-07-17 DIAGNOSIS — F329 Major depressive disorder, single episode, unspecified: Secondary | ICD-10-CM | POA: Diagnosis not present

## 2014-07-17 DIAGNOSIS — N736 Female pelvic peritoneal adhesions (postinfective): Secondary | ICD-10-CM | POA: Diagnosis not present

## 2014-07-17 DIAGNOSIS — F419 Anxiety disorder, unspecified: Secondary | ICD-10-CM | POA: Diagnosis not present

## 2014-07-17 DIAGNOSIS — N838 Other noninflammatory disorders of ovary, fallopian tube and broad ligament: Secondary | ICD-10-CM | POA: Diagnosis present

## 2014-07-17 DIAGNOSIS — J45909 Unspecified asthma, uncomplicated: Secondary | ICD-10-CM | POA: Diagnosis not present

## 2014-07-17 DIAGNOSIS — N8329 Other ovarian cysts: Secondary | ICD-10-CM | POA: Diagnosis not present

## 2014-07-17 DIAGNOSIS — N800 Endometriosis of uterus: Secondary | ICD-10-CM | POA: Diagnosis not present

## 2014-07-17 DIAGNOSIS — D649 Anemia, unspecified: Secondary | ICD-10-CM | POA: Diagnosis not present

## 2014-07-17 DIAGNOSIS — N801 Endometriosis of ovary: Secondary | ICD-10-CM | POA: Diagnosis not present

## 2014-07-17 LAB — CBC
HCT: 44.8 % (ref 36.0–46.0)
Hemoglobin: 14.7 g/dL (ref 12.0–15.0)
MCH: 31.6 pg (ref 26.0–34.0)
MCHC: 32.8 g/dL (ref 30.0–36.0)
MCV: 96.3 fL (ref 78.0–100.0)
Platelets: 187 10*3/uL (ref 150–400)
RBC: 4.65 MIL/uL (ref 3.87–5.11)
RDW: 13.9 % (ref 11.5–15.5)
WBC: 9.4 10*3/uL (ref 4.0–10.5)

## 2014-07-17 NOTE — Progress Notes (Signed)
NO URINE DRUG SCREEN NEEDED.Marland Kitchen PER DR Seward Speck

## 2014-07-17 NOTE — Patient Instructions (Signed)
   Your procedure is scheduled on: FEB 23 AT 730AM  Enter through the Main Entrance of [facility] at:6AM [place] up the phone at the desk and dial [phone] and inform us of your arrival.  Please call this number if you have any problems the morning of surgery: [phone]  Remember: Do not eat food after midnight:FEB22 Do not drink clear liquids after: FEB22 Take these medicines the morning of surgery with a SIP OF WATER:  Do not wear jewelry, make-up, or FINGER nail polish No metal in your hair or on your body. Do not wear lotions, powders, perfumes.  You may wear deodorant.  Do not bring valuables to the hospital. Contacts, dentures or bridgework may not be worn into surgery.  Leave suitcase in the car. After Surgery it may be brought to your room. For patients being admitted to the hospital, checkout time is 11:00am the day of discharge.    Patients discharged on the day of surgery will not be allowed to drive home.

## 2014-07-17 NOTE — H&P (Addendum)
    S:  [name] presents today for followup evaluation of ovarian cysts.  She is postmenopausal based on [facility], and she has also had a lack of periods.  She does have fibroids.  Previously had an ultrasound showing bilateral ovarian cysts with a small solid component 14 mm in size.  Had a normal CA-125 and really was not  interested in surgery but since there has not been any improvement she wants to be minimally invasive. O:  Physical exam:  General:  Alert and oriented.  Normocephalic and atraumatic.  No thyromegaly or masses palpated.  Heart is regular rate and rhythm without murmur.  Lungs are clear to auscultation bilaterally.  Breasts without masses, lymphadenopathy or discharge.  Abdomen is soft, nontender, nondistended.  No rebound or guarding.  No flank pain is noted.  Extremities without clubbing, cyanosis or edema.  Normal external genitalia, Bartholin, Skene's and urethra.  Vagina without lesions.  Cervix within normal limits.  Uterus is slightly enlarged consistent with fibroids.  No adnexal masses are palpable.  No inguinal lymphadenopathy palpable.     Today ultrasound does show the intramural fibroids without significant change.  Right ovary is 2.9 x 2.0 cm complex cyst with low-level internal echos of 13 and 9 mm solid area.  The left ovary has a 2.3 cm complex cyst with low-level internal echos and a septation.  No blood flow seen within the cyst or solid areas.  There is blood flow to the ovaries.  There is no free fluid.   A/P:  Bilateral ovarian cysts, postmenopausal, both with solid components.  Recommend surgical evaluation of these.  Discussed abdominal hysterectomy and bilateral salpingo-oophorectomy.  Discussed also minimally we could proceed with laparoscopic BSO.  We discussed the risks and benefits, pros and cons of each.  Certainly if these turned out to be premalignant or malignant, she will need a hysterectomy and staging procedure anyway.  At this time, she elects to proceed with  the laparoscopic BSO.      Pt seen and examined and H&P is current. 07/18/14 0715 Louretta Shorten, MD

## 2014-07-17 NOTE — Anesthesia Preprocedure Evaluation (Addendum)
Anesthesia Evaluation  Patient identified by MRN, date of birth, ID band Patient awake    Reviewed: Allergy & Precautions, NPO status , Patient's Chart, lab work & pertinent test results  History of Anesthesia Complications Negative for: history of anesthetic complications  Airway Mallampati: II  TM Distance: >3 FB Neck ROM: Full    Dental no notable dental hx. (+) Dental Advisory Given, Chipped, Poor Dentition   Pulmonary asthma , Current Smoker,  breath sounds clear to auscultation  Pulmonary exam normal       Cardiovascular negative cardio ROS  Rhythm:Regular Rate:Normal     Neuro/Psych PSYCHIATRIC DISORDERS Anxiety Depression negative neurological ROS     GI/Hepatic negative GI ROS, (+)     substance abuse (quit cocaine in 2009)  alcohol use, cocaine use and marijuana use,   Endo/Other  negative endocrine ROS  Renal/GU negative Renal ROS  negative genitourinary   Musculoskeletal negative musculoskeletal ROS (+)   Abdominal   Peds negative pediatric ROS (+)  Hematology  (+) anemia ,   Anesthesia Other Findings   Reproductive/Obstetrics negative OB ROS                            Anesthesia Physical Anesthesia Plan  ASA: III  Anesthesia Plan: General   Post-op Pain Management:    Induction: Intravenous  Airway Management Planned: Oral ETT  Additional Equipment:   Intra-op Plan:   Post-operative Plan: Extubation in OR  Informed Consent: I have reviewed the patients History and Physical, chart, labs and discussed the procedure including the risks, benefits and alternatives for the proposed anesthesia with the patient or authorized representative who has indicated his/her understanding and acceptance.   Dental advisory given  Plan Discussed with: CRNA  Anesthesia Plan Comments:         Anesthesia Quick Evaluation

## 2014-07-18 ENCOUNTER — Ambulatory Visit (HOSPITAL_COMMUNITY): Payer: 59 | Admitting: Anesthesiology

## 2014-07-18 ENCOUNTER — Encounter (HOSPITAL_COMMUNITY)
Admission: RE | Disposition: A | Discharge status: Home / Self Care | Payer: Self-pay | Source: Ambulatory Visit | Attending: Obstetrics and Gynecology

## 2014-07-18 ENCOUNTER — Ambulatory Visit (HOSPITAL_COMMUNITY)
Admission: RE | Admit: 2014-07-18 | Discharge: 2014-07-18 | Disposition: A | Discharge status: Home / Self Care | Payer: 59 | Source: Ambulatory Visit | Attending: Obstetrics and Gynecology | Admitting: Obstetrics and Gynecology

## 2014-07-18 DIAGNOSIS — N801 Endometriosis of ovary: Secondary | ICD-10-CM | POA: Insufficient documentation

## 2014-07-18 DIAGNOSIS — N736 Female pelvic peritoneal adhesions (postinfective): Secondary | ICD-10-CM | POA: Insufficient documentation

## 2014-07-18 DIAGNOSIS — N8329 Other ovarian cysts: Secondary | ICD-10-CM | POA: Diagnosis not present

## 2014-07-18 DIAGNOSIS — N803 Endometriosis of pelvic peritoneum: Secondary | ICD-10-CM | POA: Insufficient documentation

## 2014-07-18 DIAGNOSIS — F17210 Nicotine dependence, cigarettes, uncomplicated: Secondary | ICD-10-CM | POA: Insufficient documentation

## 2014-07-18 DIAGNOSIS — J45909 Unspecified asthma, uncomplicated: Secondary | ICD-10-CM | POA: Insufficient documentation

## 2014-07-18 DIAGNOSIS — F419 Anxiety disorder, unspecified: Secondary | ICD-10-CM | POA: Insufficient documentation

## 2014-07-18 DIAGNOSIS — F1210 Cannabis abuse, uncomplicated: Secondary | ICD-10-CM | POA: Insufficient documentation

## 2014-07-18 DIAGNOSIS — D259 Leiomyoma of uterus, unspecified: Secondary | ICD-10-CM | POA: Insufficient documentation

## 2014-07-18 DIAGNOSIS — F329 Major depressive disorder, single episode, unspecified: Secondary | ICD-10-CM | POA: Insufficient documentation

## 2014-07-18 DIAGNOSIS — N800 Endometriosis of uterus: Secondary | ICD-10-CM | POA: Insufficient documentation

## 2014-07-18 DIAGNOSIS — D649 Anemia, unspecified: Secondary | ICD-10-CM | POA: Insufficient documentation

## 2014-07-18 HISTORY — PX: BILATERAL SALPINGECTOMY: SHX5743

## 2014-07-18 HISTORY — PX: LAPAROSCOPY: SHX197

## 2014-07-18 HISTORY — PX: LYSIS OF ADHESION: SHX5961

## 2014-07-18 SURGERY — LAPAROSCOPY OPERATIVE
Anesthesia: General | Site: Abdomen

## 2014-07-18 MED ORDER — DEXTROSE 5 % IV SOLN
2.0000 g | INTRAVENOUS | Status: AC
  Administered 2014-07-18: 2 g via INTRAVENOUS
  Filled 2014-07-18: qty 2

## 2014-07-18 MED ORDER — HYDROCODONE-ACETAMINOPHEN 5-325 MG PO TABS
ORAL_TABLET | ORAL | Status: DC
Start: 2014-07-18 — End: 2014-07-18
  Filled 2014-07-18: qty 1

## 2014-07-18 MED ORDER — ALBUTEROL SULFATE HFA 108 (90 BASE) MCG/ACT IN AERS
INHALATION_SPRAY | RESPIRATORY_TRACT | Status: AC
  Administered 2014-07-18: 2 via RESPIRATORY_TRACT
  Filled 2014-07-18: qty 6.7

## 2014-07-18 MED ORDER — HYDROMORPHONE HCL 1 MG/ML IJ SOLN
INTRAMUSCULAR | Status: AC
  Filled 2014-07-18: qty 1

## 2014-07-18 MED ORDER — HYDROCODONE-ACETAMINOPHEN 5-325 MG PO TABS: 1.0000 | ORAL_TABLET | Freq: Four times a day (QID) | ORAL | Status: DC | PRN

## 2014-07-18 MED ORDER — HYDROCODONE-ACETAMINOPHEN 5-325 MG PO TABS
1.0000 | ORAL_TABLET | Freq: Once | ORAL | Status: AC
  Administered 2014-07-18: 1 via ORAL

## 2014-07-18 MED ORDER — BUPIVACAINE HCL (PF) 0.25 % IJ SOLN
INTRAMUSCULAR | Status: AC
  Filled 2014-07-18: qty 30

## 2014-07-18 MED ORDER — LIDOCAINE HCL (CARDIAC) 20 MG/ML IV SOLN
INTRAVENOUS | Status: DC | PRN
  Administered 2014-07-18: 80 mg via INTRAVENOUS

## 2014-07-18 MED ORDER — GLYCOPYRROLATE 0.2 MG/ML IJ SOLN
INTRAMUSCULAR | Status: AC
  Filled 2014-07-18: qty 3

## 2014-07-18 MED ORDER — HEPARIN SODIUM (PORCINE) 5000 UNIT/ML IJ SOLN
INTRAMUSCULAR | Status: AC
  Filled 2014-07-18: qty 1

## 2014-07-18 MED ORDER — ONDANSETRON HCL 4 MG/2ML IJ SOLN
INTRAMUSCULAR | Status: AC
  Filled 2014-07-18: qty 2

## 2014-07-18 MED ORDER — PROPOFOL 10 MG/ML IV BOLUS
INTRAVENOUS | Status: DC | PRN
  Administered 2014-07-18: 200 mg via INTRAVENOUS

## 2014-07-18 MED ORDER — DEXAMETHASONE SODIUM PHOSPHATE 10 MG/ML IJ SOLN
INTRAMUSCULAR | Status: DC | PRN
  Administered 2014-07-18: 10 mg via INTRAVENOUS

## 2014-07-18 MED ORDER — HYDROMORPHONE HCL 1 MG/ML IJ SOLN
INTRAMUSCULAR | Status: DC | PRN
  Administered 2014-07-18 (×2): 1 mg via INTRAVENOUS

## 2014-07-18 MED ORDER — FENTANYL CITRATE 0.05 MG/ML IJ SOLN
INTRAMUSCULAR | Status: DC | PRN
  Administered 2014-07-18: 50 ug via INTRAVENOUS
  Administered 2014-07-18: 100 ug via INTRAVENOUS
  Administered 2014-07-18 (×2): 50 ug via INTRAVENOUS
  Administered 2014-07-18: 100 ug via INTRAVENOUS

## 2014-07-18 MED ORDER — SCOPOLAMINE 1 MG/3DAYS TD PT72
1.0000 | MEDICATED_PATCH | Freq: Once | TRANSDERMAL | Status: AC
  Administered 2014-07-18: 1 via TRANSDERMAL
  Administered 2014-07-18: 1.5 mg via TRANSDERMAL

## 2014-07-18 MED ORDER — ONDANSETRON HCL 4 MG/2ML IJ SOLN
INTRAMUSCULAR | Status: DC | PRN
  Administered 2014-07-18 (×2): 2 mg via INTRAVENOUS

## 2014-07-18 MED ORDER — MIDAZOLAM HCL 2 MG/2ML IJ SOLN
INTRAMUSCULAR | Status: DC | PRN
  Administered 2014-07-18: 0.5 mg via INTRAVENOUS
  Administered 2014-07-18: 1.5 mg via INTRAVENOUS

## 2014-07-18 MED ORDER — DEXAMETHASONE SODIUM PHOSPHATE 10 MG/ML IJ SOLN
INTRAMUSCULAR | Status: AC
  Filled 2014-07-18: qty 1

## 2014-07-18 MED ORDER — LACTATED RINGERS IR SOLN
Status: DC | PRN
  Administered 2014-07-18: 3000 mL

## 2014-07-18 MED ORDER — MIDAZOLAM HCL 2 MG/2ML IJ SOLN
INTRAMUSCULAR | Status: AC
  Filled 2014-07-18: qty 2

## 2014-07-18 MED ORDER — LACTATED RINGERS IV SOLN
INTRAVENOUS | Status: DC
  Administered 2014-07-18 (×2): via INTRAVENOUS

## 2014-07-18 MED ORDER — FENTANYL CITRATE 0.05 MG/ML IJ SOLN
INTRAMUSCULAR | Status: AC
  Filled 2014-07-18: qty 5

## 2014-07-18 MED ORDER — NEOSTIGMINE METHYLSULFATE 10 MG/10ML IV SOLN
INTRAVENOUS | Status: DC | PRN
  Administered 2014-07-18: 3 mg via INTRAVENOUS

## 2014-07-18 MED ORDER — FENTANYL CITRATE 0.05 MG/ML IJ SOLN
INTRAMUSCULAR | Status: AC
  Filled 2014-07-18: qty 2

## 2014-07-18 MED ORDER — SCOPOLAMINE 1 MG/3DAYS TD PT72
MEDICATED_PATCH | TRANSDERMAL | Status: AC
  Administered 2014-07-18: 1.5 mg via TRANSDERMAL
  Filled 2014-07-18: qty 1

## 2014-07-18 MED ORDER — GLYCOPYRROLATE 0.2 MG/ML IJ SOLN
INTRAMUSCULAR | Status: DC | PRN
  Administered 2014-07-18: 0.1 mg via INTRAVENOUS
  Administered 2014-07-18: 0.3 mg via INTRAVENOUS
  Administered 2014-07-18: 0.1 mg via INTRAVENOUS

## 2014-07-18 MED ORDER — LIDOCAINE HCL (CARDIAC) 20 MG/ML IV SOLN
INTRAVENOUS | Status: AC
  Filled 2014-07-18: qty 5

## 2014-07-18 MED ORDER — PROPOFOL 10 MG/ML IV BOLUS
INTRAVENOUS | Status: AC
Start: 2014-07-18 — End: 2014-07-18
  Filled 2014-07-18: qty 20

## 2014-07-18 MED ORDER — ROCURONIUM BROMIDE 100 MG/10ML IV SOLN
INTRAVENOUS | Status: DC | PRN
  Administered 2014-07-18: 30 mg via INTRAVENOUS

## 2014-07-18 MED ORDER — KETOROLAC TROMETHAMINE 30 MG/ML IJ SOLN
INTRAMUSCULAR | Status: AC
  Filled 2014-07-18: qty 1

## 2014-07-18 MED ORDER — ROCURONIUM BROMIDE 100 MG/10ML IV SOLN
INTRAVENOUS | Status: AC
  Filled 2014-07-18: qty 1

## 2014-07-18 MED ORDER — BUPIVACAINE HCL (PF) 0.25 % IJ SOLN
INTRAMUSCULAR | Status: DC | PRN
  Administered 2014-07-18: 10 mL

## 2014-07-18 MED ORDER — NEOSTIGMINE METHYLSULFATE 10 MG/10ML IV SOLN
INTRAVENOUS | Status: AC
Start: 2014-07-18 — End: 2014-07-18
  Filled 2014-07-18: qty 1

## 2014-07-18 SURGICAL SUPPLY — 25 items
BARRIER ADHS 3X4 INTERCEED (GAUZE/BANDAGES/DRESSINGS) ×4 IMPLANT
CABLE HIGH FREQUENCY MONO STRZ (ELECTRODE) IMPLANT
CATH ROBINSON RED A/P 16FR (CATHETERS) ×4 IMPLANT
CLOTH BEACON ORANGE TIMEOUT ST (SAFETY) ×4 IMPLANT
DRSG COVADERM PLUS 2X2 (GAUZE/BANDAGES/DRESSINGS) ×4 IMPLANT
DRSG OPSITE POSTOP 3X4 (GAUZE/BANDAGES/DRESSINGS) ×4 IMPLANT
FORCEPS CUTTING 45CM 5MM (CUTTING FORCEPS) ×4 IMPLANT
GLOVE BIO SURGEON STRL SZ8 (GLOVE) ×4 IMPLANT
GLOVE SURG ORTHO 8.0 STRL STRW (GLOVE) ×4 IMPLANT
GOWN STRL REUS W/TWL LRG LVL3 (GOWN DISPOSABLE) ×8 IMPLANT
NEEDLE INSUFFLATION 120MM (ENDOMECHANICALS) ×4 IMPLANT
NS IRRIG 1000ML POUR BTL (IV SOLUTION) ×4 IMPLANT
PACK LAPAROSCOPY BASIN (CUSTOM PROCEDURE TRAY) ×4 IMPLANT
PAD POSITIONER PINK NONSTERILE (MISCELLANEOUS) ×4 IMPLANT
POUCH SPECIMEN RETRIEVAL 10MM (ENDOMECHANICALS) ×4 IMPLANT
PROTECTOR NERVE ULNAR (MISCELLANEOUS) ×4 IMPLANT
SET IRRIG TUBING LAPAROSCOPIC (IRRIGATION / IRRIGATOR) ×4 IMPLANT
SOLUTION ELECTROLUBE (MISCELLANEOUS) ×4 IMPLANT
SUT VICRYL 0 UR6 27IN ABS (SUTURE) ×4 IMPLANT
SUT VICRYL RAPIDE 3 0 (SUTURE) ×4 IMPLANT
TOWEL OR 17X24 6PK STRL BLUE (TOWEL DISPOSABLE) ×8 IMPLANT
TROCAR OPTI TIP 5M 100M (ENDOMECHANICALS) ×8 IMPLANT
TROCAR XCEL DIL TIP R 11M (ENDOMECHANICALS) ×4 IMPLANT
WARMER LAPAROSCOPE (MISCELLANEOUS) ×4 IMPLANT
WATER STERILE IRR 1000ML POUR (IV SOLUTION) ×4 IMPLANT

## 2014-07-18 NOTE — Discharge Instructions (Signed)

## 2014-07-18 NOTE — Anesthesia Postprocedure Evaluation (Signed)
  Anesthesia Post-op Note  Patient: [name]  Procedure(s) Performed: Procedure(s) (LRB): LAPAROSCOPY OPERATIVE WITH ENDOCATCH (N/A) BILATERAL SALPINGECTOMY (Bilateral) LYSIS OF ADHESION (N/A)  Patient Location: PACU  Anesthesia Type: General  Level of Consciousness: awake and alert   Airway and Oxygen Therapy: Patient Spontanous Breathing  Post-op Pain: mild  Post-op Assessment: Post-op Vital signs reviewed, Patient's Cardiovascular Status Stable, Respiratory Function Stable, Patent Airway and No signs of Nausea or vomiting  Last Vitals:  Filed Vitals:   07/18/14 0553  BP: 142/94  Pulse:   Temp:   Resp:     Post-op Vital Signs: stable   Complications: No apparent anesthesia complications

## 2014-07-18 NOTE — Transfer of Care (Signed)
Immediate Anesthesia Transfer of Care Note  Patient: [name]  Procedure(s) Performed: Procedure(s): LAPAROSCOPY OPERATIVE WITH ENDOCATCH (N/A) BILATERAL SALPINGECTOMY (Bilateral) LYSIS OF ADHESION (N/A)  Patient Location: PACU  Anesthesia Type:General  Level of Consciousness: awake, alert  and oriented  Airway & Oxygen Therapy: Patient Spontanous Breathing and Patient connected to nasal cannula oxygen  Post-op Assessment: Report given to RN, Post -op Vital signs reviewed and stable and Patient moving all extremities X 4  Post vital signs: Reviewed and stable  Last Vitals:  Filed Vitals:   07/18/14 0553  BP: 142/94  Pulse:   Temp:   Resp:     Complications: No apparent anesthesia complications

## 2014-07-18 NOTE — Addendum Note (Signed)
Addendum  created 07/18/14 1215 by Lyda Jester, CRNA   Modules edited: Anesthesia LDA, Lines/Drains/Airways Properties Editor   Lines/Drains/Airways Properties Editor:  Properties of line/drain/airway/wound [REMOVED] Airway 7 mm have been modified.; Properties of line/drain/airway/wound Airway 7 mm have been modified.

## 2014-07-18 NOTE — Brief Op Note (Signed)
07/18/2014  8:51 AM  PATIENT:  Danila R Shrum  49 y.o. female  PRE-OPERATIVE DIAGNOSIS:  Bilateral ovarian Masses, Post Menopausal  POST-OPERATIVE DIAGNOSIS:  Bilateral ovarian Masses, Post Menopausal  PROCEDURE:  Procedure(s): LAPAROSCOPY OPERATIVE WITH ENDOCATCH (N/A) OOPHORECTOMY (Bilateral)  SURGEON:  Surgeon(s) and Role:    * Luz Lex, MD - Primary  PHYSICIAN ASSISTANT:   ASSISTANTS: none   ANESTHESIA:   general  EBL:  Total I/O In: 1000 [I.V.:1000] Out: 150 [Urine:100; Blood:50]  BLOOD ADMINISTERED:none  DRAINS: none and Gastrostomy Tube   LOCAL MEDICATIONS USED:  MARCAINE     SPECIMEN:  Source of Specimen:  Bilateral tubes and ovaries  DISPOSITION OF SPECIMEN:  PATHOLOGY  COUNTS:  YES  TOURNIQUET:  * No tourniquets in log *  DICTATION: .Other Dictation: Dictation Number 1  PLAN OF CARE: Discharge to home after PACU  PATIENT DISPOSITION:  PACU - hemodynamically stable.   Delay start of Pharmacological VTE agent (>24hrs) due to surgical blood loss or risk of bleeding: not applicable

## 2014-07-19 ENCOUNTER — Encounter (HOSPITAL_COMMUNITY): Payer: Self-pay | Admitting: Obstetrics and Gynecology

## 2014-07-19 NOTE — Op Note (Signed)
[name], [name]                 ACCOUNT NO.:  [id number]  MEDICAL RECORD NO.:  [id number]  LOCATION:  [facility]                          FACILITY:  [facility]  PHYSICIAN:  Monia Sabal. Corinna Capra, M.D.    DATE OF BIRTH:  Jan 11, 1966  DATE OF PROCEDURE:  07/18/2014 DATE OF DISCHARGE:  07/18/2014                              OPERATIVE REPORT   PREOPERATIVE DIAGNOSES:  Pelvic pain, bilateral ovarian cyst, postmenopausal.  POSTOPERATIVE DIAGNOSES:  Pelvic pain, bilateral ovarian cyst, postmenopausal plus extensive pelvic adhesions and endometrioma.  PROCEDURE:  Laparoscopic bilateral salpingo-oophorectomy and lysis of adhesions.  SURGEON:  Monia Sabal. Corinna Capra, M.D.  ANESTHESIA:  General endotracheal.  INDICATIONS:  [name] is a 49 year old postmenopausal black female with known fibroids, pelvic pain.  She underwent ultrasound which shows bilateral ovarian cysts with a question of a solid mass.  She had a normal CA-125.  Followup ultrasound a month later showed no change other than being slightly larger.  Because she is in postmenopausal status, I did discuss and recommend hysterectomy along with bilateral salpingo-oophorectomy.  She desired more minimally invasive surgery because she does not want the recovery time with hysterectomy and elected to go with laparoscopic removal of both tubes and ovaries since she is menopausal but preservation of her uterus.  We discussed the risks, benefits, pros and cons at length.  Informed consent was obtained.  FINDINGS AT THE TIME OF SURGERY:  Adhesions to the anterior abdominal wall from the bowel and omentum, also extensive adhesions of the uterus to the underlying bowel and pelvic sidewalls from endometriosis.  An 8 week size fibroid uterus.  Cecum was surgically Absent and normal-appearing liver and gallbladder.  The ovarian masses grossly appeared to be endometriomas  DESCRIPTION OF PROCEDURE:  After adequate analgesia, the patient was placed in dorsal  lithotomy position.  She was sterilely prepped and draped.  Bladder was sterilely drained.  Graves speculum was placed.  A Cohen tenaculum was placed on the cervix.  A 1 cm infraumbilical skin incision was made.  Veress needle was inserted.  The abdomen was insufflated with dullness to percussion.  An 11 mm trocar was inserted and the above findings were noted.  A 5 mm trocar was inserted to the left of the midline 2 fingerbreadths above the pubic symphysis under direct visualization.  After careful and systematic evaluation of the abdomen and pelvis, lysis of adhesions were carried out using Gyrus cutting forceps were used to  dissect the adhesions, removing the omentum from the anterior abdominal wall with care taken to avoid injury to the bowel and  freeing up the uterus with the underlying adhesions to achieve hemostasis and avoid injury to the bowel.  Right tube and ovary were identified and grasped with atraumatic graspers for countertraction and blunt dissection was carried out freeing the ovaries from pelvic sidewall with relative ease.  Minimal bleeding was noted once the lysis of adhesions was completed.  The Gyrus cutting forceps were used to ligate across the right infundibulopelvic ligament removing the right tube and ovary with good hemostasis.  Nezhat suction irrigator was used to irrigate the sidewall.  Good hemostasis was achieved.  I was able to  visualize The ureter with perstalsis well away from the line disection.  The left tubo-ovarian complex more densely adhered, the infundibulopelvic ligament was identified and carefull disection across this as insertion into the ovary well away from the underlying ureter.  I disected across the mesosalpinx, was able to grasp the ovary and able to both sharply and bluntly dissect across the ovarian fossa, removing left tube and ovarian adhesions from the pelvic sidewall.  I was able to visualize the course of the ureter away from  adhesions in the pelvic sidewall.  Care had been taken to dissect and steer clear of the ureter.  Copious amount of irrigation with Nezhat suction irrigator revealed good hemostasis bilaterally.  No obvious evidence of neoplasm as the cyst appeared to be endometriomas and so  morcellation was carried and the ovaries were removed through the trocar individually and after copious amount of irrigation, adequate hemostasis was assured.  Interceed adhesion barrier was placed across the ovarian fossa bilaterally and good placement noted.  The trocars were then removed.  The umbilical skin incision closed with 0 Vicryl interrupted suture, fascia with 3-0 Vicryl Rapide subcuticular suture, 5 mm site was closed with 3-0 Vicryl Rapide interrupted sutures.  The incisions were injected with 0.25% Marcaine, total of 10 mL used.  Tenaculum was removed from the cervix, noted to be hemostatic.  The patient was then transferred to the recovery room in stable condition.  Sponges and instrument counts were normal x3. Estimated blood loss was less than 50 mL.  The patient received 1 g of cefotetan preoperatively.  Will be discharged home.  Will follow up in the office in 2-3 weeks.  Sent off a routine instruction sheet for laparoscopy.  Told to return for increased pain, fever, or bleeding. Given prescription for Vicodin 5/325 mg #30.     Monia Sabal Corinna Capra, M.D.     DCL/MEDQ  D:  07/18/2014  T:  07/19/2014  Job:  [id number]

## 2014-07-24 ENCOUNTER — Telehealth: Payer: Self-pay | Admitting: Family Medicine

## 2014-07-24 NOTE — Telephone Encounter (Signed)
Missy in the billing department at [facility] states that for [place] which patient has for OBGYN that you do not need to do a referral for insurance. So Missy is aware that referral will not be done for pt for her upcoming appt in march

## 2014-07-24 NOTE — Telephone Encounter (Signed)
Patient called and left message on voice mail that she wants a referral to Dr. Louretta Shorten.  Pt ph [phone]

## 2014-07-25 ENCOUNTER — Encounter: Payer: Self-pay | Admitting: Medical

## 2014-07-25 ENCOUNTER — Ambulatory Visit (INDEPENDENT_AMBULATORY_CARE_PROVIDER_SITE_OTHER): Payer: 59 | Admitting: Medical

## 2014-07-25 VITALS — BP 110/60 | HR 73 | Temp 98.3°F | Resp 14 | Ht 67.0 in | Wt 166.0 lb

## 2014-07-25 DIAGNOSIS — R0600 Dyspnea, unspecified: Secondary | ICD-10-CM | POA: Diagnosis not present

## 2014-07-25 DIAGNOSIS — K5909 Other constipation: Secondary | ICD-10-CM

## 2014-07-25 DIAGNOSIS — R358 Other polyuria: Secondary | ICD-10-CM | POA: Diagnosis not present

## 2014-07-25 DIAGNOSIS — Z23 Encounter for immunization: Secondary | ICD-10-CM

## 2014-07-25 DIAGNOSIS — F17200 Nicotine dependence, unspecified, uncomplicated: Secondary | ICD-10-CM

## 2014-07-25 DIAGNOSIS — Z800 Family history of malignant neoplasm of digestive organs: Secondary | ICD-10-CM

## 2014-07-25 DIAGNOSIS — Z720 Tobacco use: Secondary | ICD-10-CM

## 2014-07-25 DIAGNOSIS — R631 Polydipsia: Secondary | ICD-10-CM

## 2014-07-25 DIAGNOSIS — R3589 Other polyuria: Secondary | ICD-10-CM

## 2014-07-25 DIAGNOSIS — F1911 Other psychoactive substance abuse, in remission: Secondary | ICD-10-CM

## 2014-07-25 DIAGNOSIS — Z0000 Encounter for general adult medical examination without abnormal findings: Secondary | ICD-10-CM | POA: Diagnosis not present

## 2014-07-25 DIAGNOSIS — Z87898 Personal history of other specified conditions: Secondary | ICD-10-CM | POA: Diagnosis not present

## 2014-07-25 DIAGNOSIS — K5900 Constipation, unspecified: Secondary | ICD-10-CM | POA: Diagnosis not present

## 2014-07-25 LAB — CBC WITH DIFFERENTIAL/PLATELET
Basophils Absolute: 0 10*3/uL (ref 0.0–0.1)
Basophils Relative: 0 % (ref 0–1)
Eosinophils Absolute: 0.1 10*3/uL (ref 0.0–0.7)
Eosinophils Relative: 1 % (ref 0–5)
HCT: 42.4 % (ref 36.0–46.0)
Hemoglobin: 14 g/dL (ref 12.0–15.0)
LYMPHS ABS: 3 10*3/uL (ref 0.7–4.0)
LYMPHS PCT: 23 % (ref 12–46)
MCH: 31.6 pg (ref 26.0–34.0)
MCHC: 33 g/dL (ref 30.0–36.0)
MCV: 95.7 fL (ref 78.0–100.0)
MONO ABS: 1 10*3/uL (ref 0.1–1.0)
MONOS PCT: 8 % (ref 3–12)
MPV: 10.4 fL (ref 8.6–12.4)
NEUTROS ABS: 8.8 10*3/uL — AB (ref 1.7–7.7)
Neutrophils Relative %: 68 % (ref 43–77)
PLATELETS: 218 10*3/uL (ref 150–400)
RBC: 4.43 MIL/uL (ref 3.87–5.11)
RDW: 13.8 % (ref 11.5–15.5)
WBC: 12.9 10*3/uL — AB (ref 4.0–10.5)

## 2014-07-25 NOTE — Progress Notes (Signed)
Subjective:   HPI  [name] is a 49 y.o. female who presents for a complete physical.  Medical care team includes:  Dr. Orie Rout, [facility]  Dr. Louretta Shorten, gynecology  Dorothea Ogle, PA-C here for primary care, recently established care here   Preventative care: Last ophthalmology visit: NO Last dental visit:NO Last colonoscopy:N/.A Last mammogram:05/2014 Last gynecological exam: HAD SURGERY WITH DR. Corinna Capra 07/18/14 Last EKG:07/17/14 Last labs:?  Prior vaccinations: TD or Tdap:UNSURE Influenza:03/2014 Pneumococcal:N/A Shingles/Zostavax:N/A  Concerns: None in particular, just had surgery 2 wk ago to remove both ovaries.  Uterus intact.  Sees gyn, up to date on mammogram and pap.  Past Medical History  Diagnosis Date  . Depression     Follows with [facility], history of voluntary admission to [facility].  History of suisidal ideation with drug od (50 pills of ibuprofen).   . Bronchial asthma   . Tobacco abuse   . History of cocaine abuse     Quit in 2009  . Marijuana abuse     Hx of, quit in 2009  . Alcohol abuse     Hx of, quit in 2009  . Transaminitis     Considered to be secondary to alchol use.   . Adnexal mass 2006    Bilateral ovarian cystic masses- recomended GYN FU.   . Menorrhagia 2006    Endometiral Biopsy- DEGENERATING SECRETORY-TYPE ENDOMETRIUM  . Ankle fracture     Bimalleolar sp closed reduction under floroscopy.   . Normocytic anemia     Past Surgical History  Procedure Laterality Date  . Close reduction of bimalleolar ankle fracture    . Laparoscopic appendectomy  07/10/2011    Procedure: APPENDECTOMY LAPAROSCOPIC;  Surgeon: Rolm Bookbinder, MD;  Location: [facility];  Service: General;  Laterality: N/A;  . Bartholin gland cyst excision  2015  . Laparoscopy N/A 07/18/2014    Procedure: LAPAROSCOPY OPERATIVE WITH ENDOCATCH;  Surgeon: Luz Lex, MD;  Location: [facility];  Service: Gynecology;  Laterality: N/A;  . Bilateral  salpingectomy Bilateral 07/18/2014    Procedure: BILATERAL SALPINGECTOMY;  Surgeon: Luz Lex, MD;  Location: [facility];  Service: Gynecology;  Laterality: Bilateral;  . Lysis of adhesion N/A 07/18/2014    Procedure: LYSIS OF ADHESION;  Surgeon: Luz Lex, MD;  Location: [facility];  Service: Gynecology;  Laterality: N/A;    History   Social History  . Marital Status: Single    Spouse Name: N/A  . Number of Children: N/A  . Years of Education: N/A   Occupational History  . Not on file.   Social History Main Topics  . Smoking status: Current Every Day Smoker -- 0.50 packs/day for 9 years    Types: Cigarettes  . Smokeless tobacco: Not on file  . Alcohol Use: No     Comment: Quit in 09.   . Drug Use: No     Comment: Quit Coccaine and marijuana in 2009  . Sexual Activity: Yes    Birth Control/ Protection: None, Other-see comments   Other Topics Concern  . Not on file   Social History Narrative   Lives in [place] with her mom, single, never married, no kids.  Works at [employer] with [employer] and [employer] x 6 years.  Sleeps a lot, works 3 rd shift .  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    Family History  Problem Relation Age of Onset  . Diabetes Mother   .  Hypertension Mother   . Cancer Mother 36    colon  . Hypertension Father   . Stroke Father   . Diabetes Brother   . Obesity Brother   . Heart disease Neg Hx      Current outpatient prescriptions:  .  albuterol (VENTOLIN HFA) 108 (90 BASE) MCG/ACT inhaler, Inhale 2 puffs into the lungs every 4 (four) hours as needed. For shortness of breath., Disp: , Rfl:  .  baclofen (LIORESAL) 10 MG tablet, Take 10 mg by mouth daily as needed (headache). , Disp: , Rfl: 0 .  methocarbamol (ROBAXIN) 500 MG tablet, Take 1 tablet (500 mg total) by mouth 2 (two) times daily., Disp: 20 tablet, Rfl: 0 .  naproxen (NAPROSYN) 500 MG tablet, Take 1 tablet (500 mg total) by mouth 2 (two) times daily with a meal., Disp: 30 tablet,  Rfl: 0 .  zonisamide (ZONEGRAN) 100 MG capsule, Take 200 mg by mouth daily., Disp: , Rfl: 2 .  fluticasone (CUTIVATE) 0.05 % cream, Apply topically 2 (two) times daily. (Patient not taking: Reported on 07/06/2014), Disp: 30 g, Rfl: 1 .  HYDROcodone-acetaminophen (NORCO/VICODIN) 5-325 MG per tablet, Take 1-2 tablets by mouth every 6 (six) hours as needed for moderate pain or severe pain. (Patient not taking: Reported on 07/25/2014), Disp: 30 tablet, Rfl: 0  No Known Allergies    Reviewed their medical, surgical, family, social, medication, and allergy history and updated chart as appropriate.    Review of Systems Constitutional: -fever, -chills, -sweats, -unexpected weight change, -decreased appetite, -fatigue Allergy: -sneezing, -itching, -congestion Dermatology: -changing moles, --rash, -lumps ENT: -runny nose, -ear pain, -sore throat, -hoarseness, -sinus pain, -teeth pain, - ringing in ears, -hearing loss, -nosebleeds Cardiology: -chest pain, -palpitations, -swelling, -difficulty breathing when lying flat, -waking up short of breath Respiratory: -cough, -shortness of breath, -difficulty breathing with exercise or exertion, -wheezing, -coughing up blood Gastroenterology: -abdominal pain, -nausea, -vomiting, -diarrhea, -constipation, -blood in stool, -changes in bowel movement, -difficulty swallowing or eating Hematology: -bleeding, -bruising  Musculoskeletal: -joint aches, -muscle aches, -joint swelling, -back pain, -neck pain, -cramping, -changes in gait Ophthalmology: denies vision changes, eye redness, itching, discharge Urology: -burning with urination, -difficulty urinating, -blood in urine, -urinary frequency, -urgency, -incontinence Neurology: -headache, -weakness, -tingling, -numbness, -memory loss, -falls, -dizziness Psychology: -depressed mood, -agitation, -sleep problems     Objective:   Physical Exam  BP 110/60 mmHg  Pulse 73  Temp(Src) 98.3 F (36.8 C) (Oral)  Resp 14   Ht 5\' 7"  (1.702 m)  Wt 166 lb (75.297 kg)  BMI 25.99 kg/m2  General appearance: alert, no distress, WD/WN, AA female Skin: few scattered macules, no worrisome lesions HEENT: normocephalic, conjunctiva/corneas normal, sclerae anicteric, bluish hue around bilat iris circumferentially, PERRLA, EOMi, nares patent, no discharge or erythema, pharynx normal Oral cavity: MMM, tongue normal, teeth with plaque, othweise no lesions Neck: supple, no lymphadenopathy, no thyromegaly, no masses, normal ROM, no bruits Chest: non tender, normal shape and expansion Heart: RRR, normal S1, S2, no murmurs Lungs:decrease breath sounds in general, no wheezes, rhonchi, or rales Abdomen: +bs, soft, surgical dressing over lower central abdomen, mild generalized tenderness, s/p recent surgery, non distended, no masses, no hepatomegaly, no splenomegaly, no bruits Back: non tender, normal ROM, no scoliosis Musculoskeletal: upper extremities non tender, no obvious deformity, normal ROM throughout, lower extremities non tender, no obvious deformity, normal ROM throughout Extremities: no edema, no cyanosis, no clubbing Pulses: 2+ symmetric, upper and lower extremities, normal cap refill Neurological: alert, oriented x 3, CN2-12  intact, strength normal upper extremities and lower extremities, sensation normal throughout, DTRs 2+ throughout, no cerebellar signs, gait normal Psychiatric: normal affect, behavior normal, pleasant  Breast/gyn/rectal - deferred to gynecology    Assessment and Plan :    Encounter Diagnoses  Name Primary?  . Encounter for health maintenance examination in adult Yes  . Need for Tdap vaccination   . Need for prophylactic vaccination against Streptococcus pneumoniae (pneumococcus)   . Smoker   . Dyspnea   . History of substance abuse   . Family history of colon cancer in mother   . Polydipsia   . Polyuria   . Chronic constipation     Physical exam - discussed healthy lifestyle, diet,  exercise, preventative care, vaccinations, and addressed their concerns.  Handout given. See your eye doctor yearly for routine vision care. See your dentist yearly for routine dental care including hygiene visits twice yearly. See your gynecologist yearly for routine gynecological care. Counseled on the Tdap (tetanus, diptheria, and acellular pertussis) vaccine.  Vaccine information sheet given. Tdap vaccine given after consent obtained. Counseled on the pneumococcal vaccine.  Vaccine information sheet given.  Pneumococcal PPSV 23 vaccine given after consent obtained. She will check the age of her mother's onset of colon cancer so we can decide on when she will need a colonoscopy Chronic constipation-pending labs we will give other recommendations Smoking and dyspnea-uses albuterol when necessary, pending labs consider chest x-ray and PFTs. Recommended she consider cessation. Follow-up pending labs

## 2014-07-25 NOTE — Patient Instructions (Signed)
  Thank you for giving me the opportunity to serve you today.    Your diagnosis today includes: Encounter Diagnoses  Name Primary?  . Encounter for health maintenance examination in adult Yes  . Need for Tdap vaccination   . Need for prophylactic vaccination against Streptococcus pneumoniae (pneumococcus)   . Smoker   . Dyspnea   . History of substance abuse   . Family history of colon cancer in mother   . Polydipsia   . Polyuria      Specific recommendations today include:  I recommend you make efforts to stop smoking  We updated your tetanus diphtheria and pertussis vaccine today  We updated your pneumococcal vaccine today  Find out your mother's age at diagnosis of colon cancer. If it was before 24 then you'll need a colonoscopy at least 10 years before the age she was diagnosed See your eye doctor yearly for routine vision care. See your dentist yearly for routine dental care including hygiene visits twice yearly. Eat a healthy diet, exercise regularly Pending labs I may ask you to go for chest x-ray and lung studies to check your breathing Call your gynecology office to make sure they did a screening mammogram on the other side is only seen results in the computer for 1 breast from mammogram recently  Return pending labs.    I have included other useful information below for your review.  [facility] Ophthalmology Dr. Webb Laws [place], [place], [place] [address] [phone]   [facility] Dr. Camillo Flaming [address], [place], [place] [address]  [email or web address]   Fabio Pierce, M.D. Corena Herter, O.D. [place], [place], [place] [address] Medical telephone: [phone] Optical telephone: [phone]   DENTIST Dr. Jonna Coup, dentist [address], [place], [place] [address] [phone] [email or web address]

## 2014-07-26 ENCOUNTER — Other Ambulatory Visit: Payer: Self-pay | Admitting: Medical

## 2014-07-26 DIAGNOSIS — F17200 Nicotine dependence, unspecified, uncomplicated: Secondary | ICD-10-CM

## 2014-07-26 DIAGNOSIS — D72829 Elevated white blood cell count, unspecified: Secondary | ICD-10-CM

## 2014-07-26 LAB — HEPATIC FUNCTION PANEL
ALBUMIN: 4.4 g/dL (ref 3.5–5.2)
ALK PHOS: 121 U/L — AB (ref 39–117)
ALT: 18 U/L (ref 0–35)
AST: 15 U/L (ref 0–37)
BILIRUBIN TOTAL: 0.4 mg/dL (ref 0.2–1.2)
Bilirubin, Direct: 0.1 mg/dL (ref 0.0–0.3)
Indirect Bilirubin: 0.3 mg/dL (ref 0.2–1.2)
Total Protein: 6.8 g/dL (ref 6.0–8.3)

## 2014-07-26 LAB — BASIC METABOLIC PANEL
BUN: 17 mg/dL (ref 6–23)
CO2: 24 meq/L (ref 19–32)
Calcium: 9.9 mg/dL (ref 8.4–10.5)
Chloride: 105 mEq/L (ref 96–112)
Creat: 0.78 mg/dL (ref 0.50–1.10)
Glucose, Bld: 87 mg/dL (ref 70–99)
POTASSIUM: 4.2 meq/L (ref 3.5–5.3)
SODIUM: 139 meq/L (ref 135–145)

## 2014-07-26 LAB — LIPID PANEL
Cholesterol: 201 mg/dL — ABNORMAL HIGH (ref 0–200)
HDL: 62 mg/dL (ref >=46)
LDL Cholesterol: 118 mg/dL — ABNORMAL HIGH (ref 0–99)
Total CHOL/HDL Ratio: 3.2 Ratio
Triglycerides: 107 mg/dL (ref <150)
VLDL: 21 mg/dL (ref 0–40)

## 2014-07-26 LAB — TSH: TSH: 0.464 u[IU]/mL (ref 0.350–4.500)

## 2014-07-26 LAB — HEMOGLOBIN A1C
Hgb A1c MFr Bld: 6.1 % — ABNORMAL HIGH (ref <5.7)
Mean Plasma Glucose: 128 mg/dL — ABNORMAL HIGH (ref <117)

## 2014-07-27 NOTE — Progress Notes (Signed)
LM to [facility] 

## 2014-07-28 ENCOUNTER — Telehealth: Payer: Self-pay | Admitting: Internal Medicine

## 2014-07-28 ENCOUNTER — Other Ambulatory Visit: Payer: Self-pay | Admitting: Family Medicine

## 2014-07-28 DIAGNOSIS — Z1211 Encounter for screening for malignant neoplasm of colon: Secondary | ICD-10-CM

## 2014-07-28 DIAGNOSIS — Z800 Family history of malignant neoplasm of digestive organs: Secondary | ICD-10-CM

## 2014-07-28 NOTE — Telephone Encounter (Signed)
Pt called to state that she can not have colonoscopy due to just having surgery and still recovering from that so she wants to wait awhile until she has it

## 2014-07-31 NOTE — Telephone Encounter (Signed)
See message below from patient about colonoscopy

## 2014-07-31 NOTE — Telephone Encounter (Signed)
That is fine, not urgent, lets defer to summer time

## 2014-08-02 ENCOUNTER — Encounter: Payer: Self-pay | Admitting: Internal Medicine

## 2014-08-02 ENCOUNTER — Ambulatory Visit
Admission: RE | Admit: 2014-08-02 | Discharge: 2014-08-02 | Disposition: A | Discharge status: Home / Self Care | Payer: 59 | Source: Ambulatory Visit | Attending: Medical | Admitting: Medical

## 2014-08-02 DIAGNOSIS — F17200 Nicotine dependence, unspecified, uncomplicated: Secondary | ICD-10-CM

## 2014-08-02 DIAGNOSIS — D72829 Elevated white blood cell count, unspecified: Secondary | ICD-10-CM

## 2014-08-03 NOTE — Progress Notes (Signed)
Done

## 2014-08-04 ENCOUNTER — Encounter: Payer: Self-pay | Admitting: Medical

## 2014-11-09 ENCOUNTER — Ambulatory Visit (INDEPENDENT_AMBULATORY_CARE_PROVIDER_SITE_OTHER): Payer: 59 | Admitting: Medical

## 2014-11-09 ENCOUNTER — Encounter: Payer: Self-pay | Admitting: Medical

## 2014-11-09 VITALS — Wt 171.8 lb

## 2014-11-09 DIAGNOSIS — Z87898 Personal history of other specified conditions: Secondary | ICD-10-CM | POA: Diagnosis not present

## 2014-11-09 DIAGNOSIS — R202 Paresthesia of skin: Secondary | ICD-10-CM

## 2014-11-09 DIAGNOSIS — F1911 Other psychoactive substance abuse, in remission: Secondary | ICD-10-CM

## 2014-11-09 DIAGNOSIS — R7301 Impaired fasting glucose: Secondary | ICD-10-CM

## 2014-11-09 DIAGNOSIS — Z800 Family history of malignant neoplasm of digestive organs: Secondary | ICD-10-CM

## 2014-11-09 DIAGNOSIS — R748 Abnormal levels of other serum enzymes: Secondary | ICD-10-CM | POA: Diagnosis not present

## 2014-11-09 DIAGNOSIS — M79606 Pain in leg, unspecified: Secondary | ICD-10-CM

## 2014-11-09 DIAGNOSIS — D72829 Elevated white blood cell count, unspecified: Secondary | ICD-10-CM | POA: Diagnosis not present

## 2014-11-09 MED ORDER — VITAMIN D 50 MCG (2000 UT) PO TABS: 2000.0000 [IU] | ORAL_TABLET | Freq: Every day | ORAL | Status: DC

## 2014-11-09 MED ORDER — GABAPENTIN 100 MG PO CAPS: 100.0000 mg | ORAL_CAPSULE | Freq: Every day | ORAL | Status: DC

## 2014-11-09 NOTE — Progress Notes (Signed)
Subjective: Here for f/u from recent physical and labs. Here to discussed borderline diabetes, elevated WBC, elevated ALP.  She denies fever, feeling sick.  She does report burning pains in right foot for months, recently been having weeks of calve pain bilat.  No joint pain, no swelling, no recent trauma or injury.   She does report drinking soda regularly, breads regular.  Has hx/o substance abuse but none now in quite some while.   No other aggravating or relieving factors. No other complaint.  Past Medical History  Diagnosis Date  . Depression     Follows with [facility], history of voluntary admission to [facility].  History of suisidal ideation with drug od (50 pills of ibuprofen).   . Bronchial asthma   . Tobacco abuse   . History of cocaine abuse     Quit in 2009  . Marijuana abuse     Hx of, quit in 2009  . Alcohol abuse     Hx of, quit in 2009  . Transaminitis     Considered to be secondary to alchol use.   . Adnexal mass 2006    Bilateral ovarian cystic masses- recomended GYN FU.   . Menorrhagia 2006    Endometiral Biopsy- DEGENERATING SECRETORY-TYPE ENDOMETRIUM  . Ankle fracture     Bimalleolar sp closed reduction under floroscopy.   . Normocytic anemia        Objective: Wt 171 lb 12.8 oz (77.928 kg)  General appearance: alert, no distress, WD/WN Legs with mild tenderness of calves bilat, but no varicosities, no deformity, no rash, no palpable cord or DVT Normal hip and knee and ankle ROM No edema of LE Pulses normal Monofilament exam seems decreased of right toes but otherwise sensation seems normal bilat   Assessment: Encounter Diagnoses  Name Primary?  . Leukocytosis Yes  . Impaired fasting blood sugar   . Family history of colon cancer   . Alkaline phosphatase elevation   . Paresthesia of right foot   . Leg pain, inferior, unspecified laterality   . History of substance abuse     Plan: Reviewed her recent physical and labs.      Leukocytosis - can recheck on  next visit labs  Impaired fasting glucose - discussed her concerns, her elevated glucose last vist and HgbA1C of 6.1%.  She is drinking soda regularly.  discussed diet at length, exercise, and plan to recheck glucose/HgbA1C in 13mo.  family hx/o colon cancer - advised she go ahead and have screening colonoscopy.  We will call  back since there was confusion on her part.  Elevated ALP - begin Vitamin D 2000 u daily, recheck ALP in 4-6 wk  Paresthesia of foot - likely early neuropathy given hx/o substance abuse and decreased monofilament sensation right foot today.  Begin Gabapentin 100mg  at bedtime daily  Calve pains - advised good water intake, begin OTC Multivitamin daily

## 2014-11-09 NOTE — Patient Instructions (Signed)
For feet pain, burning pain - this is likely neuropathy/nerve damage related to past substance use  Begin Gabapentin 100mg  at bedtime daily  Lets see if this improved the leg and feet pain  For leg cramps, calve pain, begin a daily multivitamin OTC  Drink at least 64 oz water daily  For borderline diabetes  Cut out sweets, soda, fried foods, candy, cake, etc  DO eat 3-4 fruits daily, including 1/2 banana daily  DO eat vegetables throughout the day, preferably each meal  Eat grains such as oatmeal, whole grain bread, whole grain pasta, brown rice, but limit serving size  So each 1 cup or less of rice or oatmeal.   OR 2 slices of bread or less at a meal  Eat lean cuts of meat such as chicken, [place], or fish, in small servings, preferably baked or grilled or steamed  Begin Vitamin D 2000 units daily in the morning  Lets plan to recheck on the leg pain in 4-6 weeks.  At that time we can also check the Vitamin D and ALP blood test.  Go for colonoscopy given mom's history

## 2014-11-15 ENCOUNTER — Encounter (HOSPITAL_COMMUNITY): Payer: Self-pay | Admitting: Emergency Medicine

## 2014-11-15 ENCOUNTER — Emergency Department (HOSPITAL_COMMUNITY)
Admission: EM | Admit: 2014-11-15 | Discharge: 2014-11-16 | Disposition: A | Discharge status: Home / Self Care | Payer: 59 | Attending: Emergency Medicine | Admitting: Emergency Medicine

## 2014-11-15 DIAGNOSIS — Z8659 Personal history of other mental and behavioral disorders: Secondary | ICD-10-CM | POA: Insufficient documentation

## 2014-11-15 DIAGNOSIS — Z7951 Long term (current) use of inhaled steroids: Secondary | ICD-10-CM | POA: Diagnosis not present

## 2014-11-15 DIAGNOSIS — Z791 Long term (current) use of non-steroidal anti-inflammatories (NSAID): Secondary | ICD-10-CM | POA: Insufficient documentation

## 2014-11-15 DIAGNOSIS — Z720 Tobacco use: Secondary | ICD-10-CM | POA: Insufficient documentation

## 2014-11-15 DIAGNOSIS — G43909 Migraine, unspecified, not intractable, without status migrainosus: Secondary | ICD-10-CM

## 2014-11-15 DIAGNOSIS — Z862 Personal history of diseases of the blood and blood-forming organs and certain disorders involving the immune mechanism: Secondary | ICD-10-CM | POA: Diagnosis not present

## 2014-11-15 DIAGNOSIS — J45909 Unspecified asthma, uncomplicated: Secondary | ICD-10-CM | POA: Diagnosis not present

## 2014-11-15 DIAGNOSIS — Z79899 Other long term (current) drug therapy: Secondary | ICD-10-CM | POA: Insufficient documentation

## 2014-11-15 DIAGNOSIS — Z8781 Personal history of (healed) traumatic fracture: Secondary | ICD-10-CM | POA: Insufficient documentation

## 2014-11-15 HISTORY — DX: Migraine, unspecified, not intractable, without status migrainosus: G43.909

## 2014-11-15 LAB — CBC WITH DIFFERENTIAL/PLATELET
BASOS PCT: 0 % (ref 0–1)
Basophils Absolute: 0 10*3/uL (ref 0.0–0.1)
Eosinophils Absolute: 0.2 10*3/uL (ref 0.0–0.7)
Eosinophils Relative: 1 % (ref 0–5)
HCT: 43.9 % (ref 36.0–46.0)
Hemoglobin: 14.8 g/dL (ref 12.0–15.0)
Lymphocytes Relative: 26 % (ref 12–46)
Lymphs Abs: 3.6 10*3/uL (ref 0.7–4.0)
MCH: 31.2 pg (ref 26.0–34.0)
MCHC: 33.7 g/dL (ref 30.0–36.0)
MCV: 92.6 fL (ref 78.0–100.0)
Monocytes Absolute: 0.8 10*3/uL (ref 0.1–1.0)
Monocytes Relative: 6 % (ref 3–12)
Neutro Abs: 9.4 10*3/uL — ABNORMAL HIGH (ref 1.7–7.7)
Neutrophils Relative %: 67 % (ref 43–77)
PLATELETS: 208 10*3/uL (ref 150–400)
RBC: 4.74 MIL/uL (ref 3.87–5.11)
RDW: 13.8 % (ref 11.5–15.5)
WBC: 14 10*3/uL — AB (ref 4.0–10.5)

## 2014-11-15 MED ORDER — SODIUM CHLORIDE 0.9 % IV BOLUS (SEPSIS)
1000.0000 mL | Freq: Once | INTRAVENOUS | Status: AC
  Administered 2014-11-16: 1000 mL via INTRAVENOUS

## 2014-11-15 MED ORDER — PROCHLORPERAZINE EDISYLATE 5 MG/ML IJ SOLN
10.0000 mg | Freq: Once | INTRAMUSCULAR | Status: AC
  Administered 2014-11-16: 10 mg via INTRAVENOUS
  Filled 2014-11-15: qty 2

## 2014-11-15 MED ORDER — DIPHENHYDRAMINE HCL 50 MG/ML IJ SOLN
25.0000 mg | Freq: Once | INTRAMUSCULAR | Status: AC
  Administered 2014-11-16: 25 mg via INTRAVENOUS
  Filled 2014-11-15: qty 1

## 2014-11-15 MED ORDER — KETOROLAC TROMETHAMINE 30 MG/ML IJ SOLN
30.0000 mg | Freq: Once | INTRAMUSCULAR | Status: AC
  Administered 2014-11-16: 30 mg via INTRAVENOUS
  Filled 2014-11-15: qty 1

## 2014-11-15 NOTE — ED Notes (Signed)
Pt. reports migraine headache with nausea and photophobia onset this evening .

## 2014-11-16 LAB — COMPREHENSIVE METABOLIC PANEL
ALK PHOS: 118 U/L (ref 38–126)
ALT: 9 U/L — AB (ref 14–54)
AST: 23 U/L (ref 15–41)
Albumin: 3.8 g/dL (ref 3.5–5.0)
Anion gap: 8 (ref 5–15)
BILIRUBIN TOTAL: 0.8 mg/dL (ref 0.3–1.2)
BUN: 17 mg/dL (ref 6–20)
CALCIUM: 9.4 mg/dL (ref 8.9–10.3)
CHLORIDE: 106 mmol/L (ref 101–111)
CO2: 26 mmol/L (ref 22–32)
Creatinine, Ser: 1.03 mg/dL — ABNORMAL HIGH (ref 0.44–1.00)
GFR calc Af Amer: >60 mL/min (ref >60)
GLUCOSE: 123 mg/dL — AB (ref 65–99)
Potassium: 3.8 mmol/L (ref 3.5–5.1)
SODIUM: 140 mmol/L (ref 135–145)
Total Protein: 6.6 g/dL (ref 6.5–8.1)

## 2014-11-16 NOTE — Discharge Instructions (Signed)
Return here as needed.  Follow-up with your primary care doctor °

## 2014-11-16 NOTE — ED Notes (Signed)
Pt verbalizes understanding of d/c instructions and denies any further needs at this time. 

## 2014-11-16 NOTE — ED Provider Notes (Signed)
CSN: [id number]     Arrival date & time 11/15/14  2220 History   First MD Initiated Contact with Patient 11/15/14 2332     Chief Complaint  Patient presents with  . Migraine     (Consider location/radiation/quality/duration/timing/severity/associated sxs/prior Treatment) HPI Patient presents to the emergency department with migraine headache that started earlier today.  The patient states that she has nausea and photophobia associated with this headache.  Patient denies lightheadedness, blurred vision, weakness, dizziness, vomiting, chest pain, shortness of breath, neck pain, fever, incontinence, or syncope.  The patient states that she normally takes migraine headaches, but did not seem to help with her headache tonight.  Patient states nothing seems make her condition better.  She states [facility] Past Medical History  Diagnosis Date  . Depression     Follows with [facility], history of voluntary admission to [facility].  History of suisidal ideation with drug od (50 pills of ibuprofen).   . Bronchial asthma   . Tobacco abuse   . History of cocaine abuse     Quit in 2009  . Marijuana abuse     Hx of, quit in 2009  . Alcohol abuse     Hx of, quit in 2009  . Transaminitis     Considered to be secondary to alchol use.   . Adnexal mass 2006    Bilateral ovarian cystic masses- recomended GYN FU.   . Menorrhagia 2006    Endometiral Biopsy- DEGENERATING SECRETORY-TYPE ENDOMETRIUM  . Ankle fracture     Bimalleolar sp closed reduction under floroscopy.   . Normocytic anemia   . Migraine headache    Past Surgical History  Procedure Laterality Date  . Close reduction of bimalleolar ankle fracture    . Laparoscopic appendectomy  07/10/2011    Procedure: APPENDECTOMY LAPAROSCOPIC;  Surgeon: Rolm Bookbinder, MD;  Location: [facility];  Service: General;  Laterality: N/A;  . Bartholin gland cyst excision  2015  . Laparoscopy N/A 07/18/2014    Procedure: LAPAROSCOPY  OPERATIVE WITH ENDOCATCH;  Surgeon: Luz Lex, MD;  Location: [place] [facility];  Service: Gynecology;  Laterality: N/A;  . Bilateral salpingectomy Bilateral 07/18/2014    Procedure: BILATERAL SALPINGECTOMY;  Surgeon: Luz Lex, MD;  Location: [place] [facility];  Service: Gynecology;  Laterality: Bilateral;  . Lysis of adhesion N/A 07/18/2014    Procedure: LYSIS OF ADHESION;  Surgeon: Luz Lex, MD;  Location: [place] [facility];  Service: Gynecology;  Laterality: N/A;   Family History  Problem Relation Age of Onset  . Diabetes Mother   . Hypertension Mother   . Cancer Mother 5    colon  . Hypertension Father   . Stroke Father   . Diabetes Brother   . Obesity Brother   . Heart disease Neg Hx    History  Substance Use Topics  . Smoking status: Current Every Day Smoker -- 0.00 packs/day for 9 years    Types: Cigarettes  . Smokeless tobacco: Not on file  . Alcohol Use: Yes     Comment: .    OB History    No data available     Review of Systems  All other systems negative except as documented in the HPI. All pertinent positives and negatives as reviewed in the HPI.  Allergies  Review of patient's allergies indicates no known allergies.  Home Medications   Prior to Admission medications   Medication Sig Start Date End Date Taking? Authorizing Provider  albuterol (VENTOLIN  HFA) 108 (90 BASE) MCG/ACT inhaler Inhale 2 puffs into the lungs every 4 (four) hours as needed. For shortness of breath.    Historical Provider, MD  baclofen (LIORESAL) 10 MG tablet Take 10 mg by mouth daily as needed (headache).  05/10/14   Historical Provider, MD  Cholecalciferol (VITAMIN D) 2000 UNITS tablet Take 1 tablet (2,000 Units total) by mouth daily. 11/09/14   Camelia Eng Tysinger, PA-C  estradiol (ESTRACE) 2 MG tablet Take 2 mg by mouth daily. 08/10/14   Historical Provider, MD  fluticasone (CUTIVATE) 0.05 % cream Apply topically 2 (two) times daily. 02/01/14   Billy Fischer, MD  gabapentin (NEURONTIN) 100 MG capsule Take 1  capsule (100 mg total) by mouth at bedtime. 11/09/14   Camelia Eng Tysinger, PA-C  HYDROcodone-acetaminophen (NORCO/VICODIN) 5-325 MG per tablet Take 1-2 tablets by mouth every 6 (six) hours as needed for moderate pain or severe pain. 07/18/14   Louretta Shorten, MD  methocarbamol (ROBAXIN) 500 MG tablet Take 1 tablet (500 mg total) by mouth 2 (two) times daily. 07/08/14   Hannah Muthersbaugh, PA-C  naproxen (NAPROSYN) 500 MG tablet Take 1 tablet (500 mg total) by mouth 2 (two) times daily with a meal. 07/08/14   Hannah Muthersbaugh, PA-C  progesterone (PROMETRIUM) 100 MG capsule TAKE 1 CAPSULE AT BEDTIME NIGHTLY. 10/10/14   Historical Provider, MD  zonisamide (ZONEGRAN) 100 MG capsule Take 200 mg by mouth daily. 06/20/14   Historical Provider, MD   BP 140/100 mmHg  Pulse 89  Temp(Src) 98.1 F (36.7 C) (Oral)  Resp 14  SpO2 98% Physical Exam  Constitutional: She is oriented to person, place, and time. She appears well-developed and well-nourished. No distress.  HENT:  Head: Normocephalic and atraumatic.  Mouth/Throat: Oropharynx is clear and moist.  Eyes: Pupils are equal, round, and reactive to light.  Neck: Normal range of motion. Neck supple.  Cardiovascular: Normal rate, regular rhythm and normal heart sounds.  Exam reveals no gallop and no friction rub.   No murmur heard. Pulmonary/Chest: Effort normal and breath sounds normal. No respiratory distress.  Neurological: She is alert and oriented to person, place, and time. She exhibits normal muscle tone. Coordination normal.  Skin: Skin is warm and dry. No rash noted. No erythema.  Psychiatric: She has a normal mood and affect. Her behavior is normal.  Nursing note and vitals reviewed.   ED Course  Procedures (including critical care time) Labs Review Labs Reviewed  CBC WITH DIFFERENTIAL/PLATELET - Abnormal; Notable for the following:    WBC 14.0 (*)    Neutro Abs 9.4 (*)    All other components within normal limits  COMPREHENSIVE METABOLIC  PANEL - Abnormal; Notable for the following:    Glucose, Bld 123 (*)    Creatinine, Ser 1.03 (*)    ALT 9 (*)    All other components within normal limits   patient be given the migraine cocktail of Toradol, Compazine and Benadryl with IV fluids.  Told to return here as needed.      Dalia Heading, PA-C 11/16/14 0175  Milton Ferguson, MD 11/17/14 [phone]

## 2014-12-09 ENCOUNTER — Encounter (HOSPITAL_COMMUNITY): Payer: Self-pay | Admitting: Emergency Medicine

## 2014-12-09 ENCOUNTER — Emergency Department (HOSPITAL_COMMUNITY)
Admission: EM | Admit: 2014-12-09 | Discharge: 2014-12-09 | Disposition: A | Discharge status: Home / Self Care | Payer: 59 | Attending: Emergency Medicine | Admitting: Emergency Medicine

## 2014-12-09 DIAGNOSIS — Z720 Tobacco use: Secondary | ICD-10-CM | POA: Insufficient documentation

## 2014-12-09 DIAGNOSIS — G43009 Migraine without aura, not intractable, without status migrainosus: Secondary | ICD-10-CM

## 2014-12-09 DIAGNOSIS — G43909 Migraine, unspecified, not intractable, without status migrainosus: Secondary | ICD-10-CM | POA: Diagnosis not present

## 2014-12-09 DIAGNOSIS — F329 Major depressive disorder, single episode, unspecified: Secondary | ICD-10-CM | POA: Insufficient documentation

## 2014-12-09 DIAGNOSIS — Z862 Personal history of diseases of the blood and blood-forming organs and certain disorders involving the immune mechanism: Secondary | ICD-10-CM | POA: Diagnosis not present

## 2014-12-09 DIAGNOSIS — Z7951 Long term (current) use of inhaled steroids: Secondary | ICD-10-CM | POA: Insufficient documentation

## 2014-12-09 DIAGNOSIS — Z79899 Other long term (current) drug therapy: Secondary | ICD-10-CM | POA: Diagnosis not present

## 2014-12-09 DIAGNOSIS — Z8781 Personal history of (healed) traumatic fracture: Secondary | ICD-10-CM | POA: Insufficient documentation

## 2014-12-09 MED ORDER — SODIUM CHLORIDE 0.9 % IV BOLUS (SEPSIS)
1000.0000 mL | Freq: Once | INTRAVENOUS | Status: AC
  Administered 2014-12-09: 1000 mL via INTRAVENOUS

## 2014-12-09 MED ORDER — DEXAMETHASONE SODIUM PHOSPHATE 10 MG/ML IJ SOLN
10.0000 mg | Freq: Once | INTRAMUSCULAR | Status: AC
  Administered 2014-12-09: 10 mg via INTRAVENOUS
  Filled 2014-12-09: qty 1

## 2014-12-09 MED ORDER — METOCLOPRAMIDE HCL 5 MG/ML IJ SOLN
10.0000 mg | Freq: Once | INTRAMUSCULAR | Status: AC
  Administered 2014-12-09: 10 mg via INTRAVENOUS
  Filled 2014-12-09: qty 2

## 2014-12-09 MED ORDER — KETOROLAC TROMETHAMINE 30 MG/ML IJ SOLN
30.0000 mg | Freq: Once | INTRAMUSCULAR | Status: AC
  Administered 2014-12-09: 30 mg via INTRAVENOUS
  Filled 2014-12-09: qty 1

## 2014-12-09 MED ORDER — DIPHENHYDRAMINE HCL 50 MG/ML IJ SOLN
50.0000 mg | Freq: Once | INTRAMUSCULAR | Status: AC
  Administered 2014-12-09: 50 mg via INTRAVENOUS
  Filled 2014-12-09: qty 1

## 2014-12-09 NOTE — ED Provider Notes (Signed)
TIME SEEN: 5:00 AM  CHIEF COMPLAINT: Migraine headache  HPI: Patient is a 49 y.o. F with previous history of migraine headaches who presents to the emergency department with complaints of a throbbing left-sided headache that started yesterday and it feels similar prior migraines. Worse with lights. Reports her normal medications at home were not helping. Denies head injury, anticoagulation or antiplatelets use, numbness, tingling or focal weakness. No fever, neck pain or neck stiffness. States this feels similar to her prior migraines.  ROS: See HPI Constitutional: no fever  Eyes: no drainage  ENT: no runny nose   Cardiovascular:  no chest pain  Resp: no SOB  GI: no vomiting GU: no dysuria Integumentary: no rash  Allergy: no hives  Musculoskeletal: no leg swelling  Neurological: no slurred speech ROS otherwise negative  PAST MEDICAL HISTORY/PAST SURGICAL HISTORY:  Past Medical History  Diagnosis Date  . Depression     Follows with [facility], history of voluntary admission to [facility].  History of suisidal ideation with drug od (50 pills of ibuprofen).   . Bronchial asthma   . Tobacco abuse   . History of cocaine abuse     Quit in 2009  . Marijuana abuse     Hx of, quit in 2009  . Alcohol abuse     Hx of, quit in 2009  . Transaminitis     Considered to be secondary to alchol use.   . Adnexal mass 2006    Bilateral ovarian cystic masses- recomended GYN FU.   . Menorrhagia 2006    Endometiral Biopsy- DEGENERATING SECRETORY-TYPE ENDOMETRIUM  . Ankle fracture     Bimalleolar sp closed reduction under floroscopy.   . Normocytic anemia   . Migraine headache     MEDICATIONS:  Prior to Admission medications   Medication Sig Start Date End Date Taking? Authorizing Provider  albuterol (VENTOLIN HFA) 108 (90 BASE) MCG/ACT inhaler Inhale 2 puffs into the lungs every 4 (four) hours as needed. For shortness of breath.    Historical Provider, MD  baclofen (LIORESAL) 10 MG tablet Take 10 mg by  mouth daily as needed (headache).  05/10/14   Historical Provider, MD  Cholecalciferol (VITAMIN D) 2000 UNITS tablet Take 1 tablet (2,000 Units total) by mouth daily. 11/09/14   Camelia Eng Tysinger, PA-C  estradiol (ESTRACE) 2 MG tablet Take 2 mg by mouth daily. 08/10/14   Historical Provider, MD  fluticasone (CUTIVATE) 0.05 % cream Apply topically 2 (two) times daily. 02/01/14   Billy Fischer, MD  gabapentin (NEURONTIN) 100 MG capsule Take 1 capsule (100 mg total) by mouth at bedtime. 11/09/14   Camelia Eng Tysinger, PA-C  HYDROcodone-acetaminophen (NORCO/VICODIN) 5-325 MG per tablet Take 1-2 tablets by mouth every 6 (six) hours as needed for moderate pain or severe pain. 07/18/14   Louretta Shorten, MD  methocarbamol (ROBAXIN) 500 MG tablet Take 1 tablet (500 mg total) by mouth 2 (two) times daily. 07/08/14   Hannah Muthersbaugh, PA-C  naproxen (NAPROSYN) 500 MG tablet Take 1 tablet (500 mg total) by mouth 2 (two) times daily with a meal. 07/08/14   Hannah Muthersbaugh, PA-C  progesterone (PROMETRIUM) 100 MG capsule TAKE 1 CAPSULE AT BEDTIME NIGHTLY. 10/10/14   Historical Provider, MD  zonisamide (ZONEGRAN) 100 MG capsule Take 200 mg by mouth daily. 06/20/14   Historical Provider, MD    ALLERGIES:  No Known Allergies  SOCIAL HISTORY:  History  Substance Use Topics  . Smoking status: Current Every Day Smoker -- 0.00 packs/day for  9 years    Types: Cigarettes  . Smokeless tobacco: Not on file  . Alcohol Use: Yes     Comment: .     FAMILY HISTORY: Family History  Problem Relation Age of Onset  . Diabetes Mother   . Hypertension Mother   . Cancer Mother 79    colon  . Hypertension Father   . Stroke Father   . Diabetes Brother   . Obesity Brother   . Heart disease Neg Hx     EXAM: BP 109/78 mmHg  Pulse 64  Temp(Src) 98.7 F (37.1 C)  Resp 16  Ht 5\' 6"  (1.676 m)  Wt 170 lb (77.111 kg)  BMI 27.45 kg/m2  SpO2 97% CONSTITUTIONAL: Alert and oriented and responds appropriately to questions. Appears  uncomfortable but is well hydrated, nontoxic and afebrile HEAD: Normocephalic EYES: Conjunctivae clear, PERRL, photophobia ENT: normal nose; no rhinorrhea; moist mucous membranes; pharynx without lesions noted NECK: Supple, no meningismus, no LAD  CARD: RRR; S1 and S2 appreciated; no murmurs, no clicks, no rubs, no gallops RESP: Normal chest excursion without splinting or tachypnea; breath sounds clear and equal bilaterally; no wheezes, no rhonchi, no rales, no hypoxia or respiratory distress, speaking full sentences ABD/GI: Normal bowel sounds; non-distended; soft, non-tender, no rebound, no guarding, no peritoneal signs BACK:  The back appears normal and is non-tender to palpation, there is no CVA tenderness EXT: Normal ROM in all joints; non-tender to palpation; no edema; normal capillary refill; no cyanosis, no calf tenderness or swelling    SKIN: Normal color for age and race; warm NEURO: Moves all extremities equally, sensation to light touch intact diffusely, cranial nerves II through XII intact PSYCH: The patient's mood and manner are appropriate. Grooming and personal hygiene are appropriate.  MEDICAL DECISION MAKING: Patient here with typical migraine headache. Neurologically intact. No head injury. No fever, meningismus. Given Toradol, Reglan, Benadryl, Decadron and IV fluids.  Patient's headache has completely resolved. I feel she is safe to be discharged home without further emergent workup. Have recommended close outpatient follow-up with her neurologist. Discussed return precautions. She verbalized understanding and is comfortable with this plan.       [place], DO 12/09/14 [phone]

## 2014-12-09 NOTE — Discharge Instructions (Signed)

## 2014-12-09 NOTE — ED Notes (Signed)
Pt. reports persistent migraine headache onset last night with nausea and photophobia , denies blurred vision , no fever or chills.

## 2014-12-12 ENCOUNTER — Encounter (HOSPITAL_COMMUNITY): Payer: Self-pay | Admitting: Emergency Medicine

## 2014-12-12 DIAGNOSIS — F329 Major depressive disorder, single episode, unspecified: Secondary | ICD-10-CM | POA: Insufficient documentation

## 2014-12-12 DIAGNOSIS — Z720 Tobacco use: Secondary | ICD-10-CM | POA: Diagnosis not present

## 2014-12-12 DIAGNOSIS — J45909 Unspecified asthma, uncomplicated: Secondary | ICD-10-CM | POA: Diagnosis not present

## 2014-12-12 DIAGNOSIS — Z793 Long term (current) use of hormonal contraceptives: Secondary | ICD-10-CM | POA: Diagnosis not present

## 2014-12-12 DIAGNOSIS — Z862 Personal history of diseases of the blood and blood-forming organs and certain disorders involving the immune mechanism: Secondary | ICD-10-CM | POA: Insufficient documentation

## 2014-12-12 DIAGNOSIS — Z8781 Personal history of (healed) traumatic fracture: Secondary | ICD-10-CM | POA: Diagnosis not present

## 2014-12-12 DIAGNOSIS — Z79899 Other long term (current) drug therapy: Secondary | ICD-10-CM | POA: Insufficient documentation

## 2014-12-12 DIAGNOSIS — R51 Headache: Secondary | ICD-10-CM | POA: Diagnosis present

## 2014-12-12 DIAGNOSIS — R109 Unspecified abdominal pain: Secondary | ICD-10-CM | POA: Diagnosis not present

## 2014-12-12 DIAGNOSIS — Z7951 Long term (current) use of inhaled steroids: Secondary | ICD-10-CM | POA: Insufficient documentation

## 2014-12-12 DIAGNOSIS — G43809 Other migraine, not intractable, without status migrainosus: Secondary | ICD-10-CM | POA: Diagnosis not present

## 2014-12-12 DIAGNOSIS — Z8742 Personal history of other diseases of the female genital tract: Secondary | ICD-10-CM | POA: Diagnosis not present

## 2014-12-12 MED ORDER — FENTANYL CITRATE (PF) 100 MCG/2ML IJ SOLN
50.0000 ug | Freq: Once | INTRAMUSCULAR | Status: AC
  Administered 2014-12-12: 50 ug via NASAL
  Filled 2014-12-12: qty 2

## 2014-12-12 NOTE — ED Notes (Signed)
Called for triage x1.

## 2014-12-12 NOTE — ED Notes (Signed)
Pt. reports migraine headache with nausea onset today , denies fever or photophobia .

## 2014-12-13 ENCOUNTER — Encounter: Payer: Self-pay | Admitting: Medical

## 2014-12-13 ENCOUNTER — Ambulatory Visit (INDEPENDENT_AMBULATORY_CARE_PROVIDER_SITE_OTHER): Payer: 59 | Admitting: Medical

## 2014-12-13 ENCOUNTER — Emergency Department (HOSPITAL_COMMUNITY)
Admission: EM | Admit: 2014-12-13 | Discharge: 2014-12-13 | Disposition: A | Discharge status: Home / Self Care | Payer: 59 | Attending: Emergency Medicine | Admitting: Emergency Medicine

## 2014-12-13 VITALS — BP 110/80 | HR 78 | Temp 98.3°F | Resp 14 | Wt 172.0 lb

## 2014-12-13 DIAGNOSIS — R748 Abnormal levels of other serum enzymes: Secondary | ICD-10-CM | POA: Diagnosis not present

## 2014-12-13 DIAGNOSIS — D72829 Elevated white blood cell count, unspecified: Secondary | ICD-10-CM | POA: Diagnosis not present

## 2014-12-13 DIAGNOSIS — E559 Vitamin D deficiency, unspecified: Secondary | ICD-10-CM | POA: Diagnosis not present

## 2014-12-13 DIAGNOSIS — R7301 Impaired fasting glucose: Secondary | ICD-10-CM

## 2014-12-13 DIAGNOSIS — G43011 Migraine without aura, intractable, with status migrainosus: Secondary | ICD-10-CM | POA: Diagnosis not present

## 2014-12-13 DIAGNOSIS — G43809 Other migraine, not intractable, without status migrainosus: Secondary | ICD-10-CM

## 2014-12-13 DIAGNOSIS — J0110 Acute frontal sinusitis, unspecified: Secondary | ICD-10-CM

## 2014-12-13 LAB — CBC WITH DIFFERENTIAL/PLATELET
BASOS PCT: 0 % (ref 0–1)
Basophils Absolute: 0 10*3/uL (ref 0.0–0.1)
EOS ABS: 0.1 10*3/uL (ref 0.0–0.7)
EOS PCT: 1 % (ref 0–5)
HCT: 45.3 % (ref 36.0–46.0)
Hemoglobin: 15.2 g/dL — ABNORMAL HIGH (ref 12.0–15.0)
Lymphocytes Relative: 21 % (ref 12–46)
Lymphs Abs: 2.8 10*3/uL (ref 0.7–4.0)
MCH: 31.5 pg (ref 26.0–34.0)
MCHC: 33.6 g/dL (ref 30.0–36.0)
MCV: 94 fL (ref 78.0–100.0)
MONO ABS: 0.7 10*3/uL (ref 0.1–1.0)
Monocytes Relative: 5 % (ref 3–12)
Neutro Abs: 9.8 10*3/uL — ABNORMAL HIGH (ref 1.7–7.7)
Neutrophils Relative %: 73 % (ref 43–77)
Platelets: 185 10*3/uL (ref 150–400)
RBC: 4.82 MIL/uL (ref 3.87–5.11)
RDW: 14 % (ref 11.5–15.5)
WBC: 13.4 10*3/uL — ABNORMAL HIGH (ref 4.0–10.5)

## 2014-12-13 LAB — POCT URINALYSIS DIPSTICK
BILIRUBIN UA: NEGATIVE
Blood, UA: NEGATIVE
GLUCOSE UA: NEGATIVE
Ketones, UA: NEGATIVE
LEUKOCYTES UA: NEGATIVE
NITRITE UA: NEGATIVE
PH UA: 6
PROTEIN UA: NEGATIVE
SPEC GRAV UA: 1.025
Urobilinogen, UA: NEGATIVE

## 2014-12-13 LAB — BASIC METABOLIC PANEL
Anion gap: 10 (ref 5–15)
BUN: 11 mg/dL (ref 6–20)
CHLORIDE: 104 mmol/L (ref 101–111)
CO2: 26 mmol/L (ref 22–32)
CREATININE: 1.11 mg/dL — AB (ref 0.44–1.00)
Calcium: 9.8 mg/dL (ref 8.9–10.3)
GFR calc Af Amer: >60 mL/min (ref >60)
GFR, EST NON AFRICAN AMERICAN: 58 mL/min — AB (ref >60)
GLUCOSE: 121 mg/dL — AB (ref 65–99)
POTASSIUM: 4.2 mmol/L (ref 3.5–5.1)
Sodium: 140 mmol/L (ref 135–145)

## 2014-12-13 LAB — HEMOGLOBIN A1C
Hgb A1c MFr Bld: 6 % — ABNORMAL HIGH (ref <5.7)
Mean Plasma Glucose: 126 mg/dL — ABNORMAL HIGH (ref <117)

## 2014-12-13 MED ORDER — ONDANSETRON HCL 4 MG PO TABS: 4.0000 mg | ORAL_TABLET | Freq: Three times a day (TID) | ORAL | Status: DC | PRN

## 2014-12-13 MED ORDER — AMOXICILLIN 875 MG PO TABS: 875.0000 mg | ORAL_TABLET | Freq: Two times a day (BID) | ORAL | Status: DC

## 2014-12-13 MED ORDER — SODIUM CHLORIDE 0.9 % IV BOLUS (SEPSIS)
1000.0000 mL | Freq: Once | INTRAVENOUS | Status: AC
  Administered 2014-12-13: 1000 mL via INTRAVENOUS

## 2014-12-13 MED ORDER — DEXAMETHASONE SODIUM PHOSPHATE 10 MG/ML IJ SOLN
10.0000 mg | Freq: Once | INTRAMUSCULAR | Status: AC
  Administered 2014-12-13: 10 mg via INTRAVENOUS
  Filled 2014-12-13: qty 1

## 2014-12-13 MED ORDER — DIPHENHYDRAMINE HCL 50 MG/ML IJ SOLN
25.0000 mg | Freq: Once | INTRAMUSCULAR | Status: AC
  Administered 2014-12-13: 25 mg via INTRAVENOUS
  Filled 2014-12-13: qty 1

## 2014-12-13 MED ORDER — METOCLOPRAMIDE HCL 5 MG/ML IJ SOLN
10.0000 mg | Freq: Once | INTRAMUSCULAR | Status: AC
  Administered 2014-12-13: 10 mg via INTRAVENOUS
  Filled 2014-12-13: qty 2

## 2014-12-13 MED ORDER — KETOROLAC TROMETHAMINE 30 MG/ML IJ SOLN
30.0000 mg | Freq: Once | INTRAMUSCULAR | Status: AC
  Administered 2014-12-13: 30 mg via INTRAVENOUS
  Filled 2014-12-13: qty 1

## 2014-12-13 NOTE — ED Notes (Signed)
Lab at the bedside 

## 2014-12-13 NOTE — Discharge Instructions (Signed)

## 2014-12-13 NOTE — ED Provider Notes (Signed)
CSN: [id number]   Arrival date & time 12/12/14 2216  History  This chart was scribed for  Megan Freiberg, MD by Altamease Oiler, ED Scribe. This patient was seen in room D35C/D35C and the patient's care was started at 2:07 AM.  Chief Complaint  Patient presents with  . Migraine    HPI The history is provided by the patient. No language interpreter was used.   Catheryn R Hudson is a 48 y.o. female who presents to the Emergency Department complaining of constant left-sided headache with gradual onset yesterday. The pain is similar to previous migraines. Baclofen provided insufficient pain relief PTA. Associated symptoms include photophobia, left eye pain, cramping abdominal pain, and nausea. Pt denies vomiting, weakness, numbness, tingling, cough, congestion, and sore throat. She last saw Dr. Luiz Ochoa (neurology) 2 weeks ago with no change in management.   Past Medical History  Diagnosis Date  . Depression     Follows with [facility], history of voluntary admission to [facility].  History of suisidal ideation with drug od (50 pills of ibuprofen).   . Bronchial asthma   . Tobacco abuse   . History of cocaine abuse     Quit in 2009  . Marijuana abuse     Hx of, quit in 2009  . Alcohol abuse     Hx of, quit in 2009  . Transaminitis     Considered to be secondary to alchol use.   . Adnexal mass 2006    Bilateral ovarian cystic masses- recomended GYN FU.   . Menorrhagia 2006    Endometiral Biopsy- DEGENERATING SECRETORY-TYPE ENDOMETRIUM  . Ankle fracture     Bimalleolar sp closed reduction under floroscopy.   . Normocytic anemia   . Migraine headache     Past Surgical History  Procedure Laterality Date  . Close reduction of bimalleolar ankle fracture    . Laparoscopic appendectomy  07/10/2011    Procedure: APPENDECTOMY LAPAROSCOPIC;  Surgeon: Rolm Bookbinder, MD;  Location: [facility];  Service: General;  Laterality: N/A;  . Bartholin gland cyst excision  2015  . Laparoscopy N/A 07/18/2014   Procedure: LAPAROSCOPY OPERATIVE WITH ENDOCATCH;  Surgeon: Luz Lex, MD;  Location: [place] [facility];  Service: Gynecology;  Laterality: N/A;  . Bilateral salpingectomy Bilateral 07/18/2014    Procedure: BILATERAL SALPINGECTOMY;  Surgeon: Luz Lex, MD;  Location: [place] [facility];  Service: Gynecology;  Laterality: Bilateral;  . Lysis of adhesion N/A 07/18/2014    Procedure: LYSIS OF ADHESION;  Surgeon: Luz Lex, MD;  Location: [place] [facility];  Service: Gynecology;  Laterality: N/A;    Family History  Problem Relation Age of Onset  . Diabetes Mother   . Hypertension Mother   . Cancer Mother 28    colon  . Hypertension Father   . Stroke Father   . Diabetes Brother   . Obesity Brother   . Heart disease Neg Hx     History  Substance Use Topics  . Smoking status: Current Every Day Smoker -- 0.00 packs/day for 9 years    Types: Cigarettes  . Smokeless tobacco: Not on file  . Alcohol Use: Yes     Comment: .      Review of Systems  Constitutional: Negative for fever.  HENT: Negative for congestion and sore throat.   Eyes: Positive for photophobia and pain.  Respiratory: Negative for cough.   Gastrointestinal: Positive for nausea and abdominal pain. Negative for vomiting.  Neurological: Positive for headaches.  All other systems reviewed and are negative.  Home Medications   Prior to Admission medications   Medication Sig Start Date End Date Taking? Authorizing Provider  albuterol (VENTOLIN HFA) 108 (90 BASE) MCG/ACT inhaler Inhale 2 puffs into the lungs every 4 (four) hours as needed. For shortness of breath.   Yes Historical Provider, MD  baclofen (LIORESAL) 10 MG tablet Take 10 mg by mouth daily as needed (headache).  05/10/14  Yes Historical Provider, MD  estradiol (ESTRACE) 2 MG tablet Take 2 mg by mouth daily. 08/10/14  Yes Historical Provider, MD  progesterone (PROMETRIUM) 100 MG capsule TAKE 1 CAPSULE AT BEDTIME NIGHTLY. 10/10/14  Yes Historical Provider, MD  zonisamide (ZONEGRAN) 100 MG  capsule Take 200 mg by mouth daily. 06/20/14  Yes Historical Provider, MD  Cholecalciferol (VITAMIN D) 2000 UNITS tablet Take 1 tablet (2,000 Units total) by mouth daily. Patient not taking: Reported on 12/13/2014 11/09/14   Camelia Eng Tysinger, PA-C  fluticasone (CUTIVATE) 0.05 % cream Apply topically 2 (two) times daily. Patient not taking: Reported on 12/13/2014 02/01/14   Billy Fischer, MD  gabapentin (NEURONTIN) 100 MG capsule Take 1 capsule (100 mg total) by mouth at bedtime. Patient not taking: Reported on 12/13/2014 11/09/14   Camelia Eng Tysinger, PA-C  HYDROcodone-acetaminophen (NORCO/VICODIN) 5-325 MG per tablet Take 1-2 tablets by mouth every 6 (six) hours as needed for moderate pain or severe pain. Patient not taking: Reported on 12/13/2014 07/18/14   Louretta Shorten, MD  methocarbamol (ROBAXIN) 500 MG tablet Take 1 tablet (500 mg total) by mouth 2 (two) times daily. Patient not taking: Reported on 12/13/2014 07/08/14   Jarrett Soho Muthersbaugh, PA-C  naproxen (NAPROSYN) 500 MG tablet Take 1 tablet (500 mg total) by mouth 2 (two) times daily with a meal. Patient not taking: Reported on 12/13/2014 07/08/14   Jarrett Soho Muthersbaugh, PA-C    Allergies  Review of patient's allergies indicates no known allergies.  Triage Vitals: BP 117/82 mmHg  Pulse 64  Temp(Src) 97.5 F (36.4 C) (Oral)  Resp 16  SpO2 97%  Physical Exam  Constitutional: She is oriented to person, place, and time. She appears well-developed and well-nourished.  HENT:  Head: Normocephalic and atraumatic.  Right Ear: External ear normal.  Left Ear: External ear normal.  Eyes: Conjunctivae and EOM are normal. Pupils are equal, round, and reactive to light.  Neck: Normal range of motion. Neck supple.  Cardiovascular: Normal rate, regular rhythm, normal heart sounds and intact distal pulses.   Pulmonary/Chest: Effort normal and breath sounds normal.  Abdominal: Soft. Bowel sounds are normal. There is no tenderness.  Musculoskeletal: Normal  range of motion.  Neurological: She is alert and oriented to person, place, and time. She has normal strength and normal reflexes. No cranial nerve deficit or sensory deficit. Coordination normal. GCS eye subscore is 4. GCS verbal subscore is 5. GCS motor subscore is 6.  Skin: Skin is warm and dry.  Vitals reviewed.   ED Course  Procedures   DIAGNOSTIC STUDIES: Oxygen Saturation is 97% on RA, normal by my interpretation.    COORDINATION OF CARE: 2:11 AM Discussed treatment plan which includes lab work,Reglan, Benadryl, Toradol, Decadron, Fentanyl, and IVF with pt at bedside and pt agreed to plan.  3:29 AM I re-evaluated the patient and her headache has resolved.    Labs Review-  Labs Reviewed  CBC WITH DIFFERENTIAL/PLATELET - Abnormal; Notable for the following:    WBC 13.4 (*)    Hemoglobin 15.2 (*)    Neutro Abs 9.8 (*)    All  other components within normal limits  BASIC METABOLIC PANEL - Abnormal; Notable for the following:    Glucose, Bld 121 (*)    Creatinine, Ser 1.11 (*)    GFR calc non Af Amer 58 (*)    All other components within normal limits    Imaging Review No results found.  EKG Interpretation None      MDM   Final diagnoses:  Other migraine without status migrainosus, not intractable     49 y.o. female with pertinent PMH of migraines presents with recurrent migraine.  On arrival exam as above, benign. Given reglan, benadryl, decadron, toradol with complete relief.  No concerning factors.  DC home in stable condition.    I have reviewed all laboratory and imaging studies if ordered as above  1. Other migraine without status migrainosus, not intractable          Megan Freiberg, MD 12/13/14 [phone]

## 2014-12-13 NOTE — Progress Notes (Signed)
Subjective: Here for ED f/u, went to the ED yesterday for migraine.  Sees headache clinic, was getting scalp injections that was helping, but insurance will no longer pay for this.  Here for f/u on labs for borderline diabetes, elevated WBC, elevated ALP.  She does note sinus pressure, congestion, fatigue, was seen in the ED yesterday for migraines. No numbness, tingling, weakness, vision or hearing changes.  No other aggravating or relieving factors. No other complaint.  Past Medical History  Diagnosis Date  . Depression     Follows with [facility], history of voluntary admission to [facility].  History of suisidal ideation with drug od (50 pills of ibuprofen).   . Bronchial asthma   . Tobacco abuse   . History of cocaine abuse     Quit in 2009  . Marijuana abuse     Hx of, quit in 2009  . Alcohol abuse     Hx of, quit in 2009  . Transaminitis     Considered to be secondary to alchol use.   . Adnexal mass 2006    Bilateral ovarian cystic masses- recomended GYN FU.   . Menorrhagia 2006    Endometiral Biopsy- DEGENERATING SECRETORY-TYPE ENDOMETRIUM  . Ankle fracture     Bimalleolar sp closed reduction under floroscopy.   . Normocytic anemia   . Migraine headache    ROS as in subjective  Objective: BP 110/80 mmHg  Pulse 78  Temp(Src) 98.3 F (36.8 C) (Oral)  Resp 14  Wt 172 lb (78.019 kg)  General appearance: alert, no distress, WD/WN HEENT: normocephalic, sclerae anicteric, +sinus tenderness, PERRLA, EOMi, nares with turbinate edema, no discharge, + erythema, pharynx normal Oral cavity: MMM, no lesions Neck: supple, no lymphadenopathy, no thyromegaly, no masses Heart: RRR, normal S1, S2, no murmurs Lungs: CTA bilaterally, no wheezes, rhonchi, or rales Extremities: no edema, no cyanosis, no clubbing Pulses: 2+ symmetric, upper and lower extremities, normal cap refill Neurological: alert, oriented x 3, CN2-12 intact, strength normal upper extremities and lower extremities, sensation  normal throughout, DTRs 2+ throughout, no cerebellar signs, gait normal Psychiatric: normal affect, behavior normal, pleasant    Assessment: Encounter Diagnoses  Name Primary?  . Intractable migraine without aura and with status migrainosus Yes  . Leukocytosis   . Impaired fasting blood sugar   . Vitamin D deficiency   . Alkaline phosphatase elevation   . Acute frontal sinusitis, recurrence not specified     Plan: Migraines - currently may be triggered by sinusitis.  Script for Zofran for nausea.  Begin round of amoxicillin for sinuitis.  F/u with headache clinic as planned leukocytosis - possible due to sinusitis.  Will need to recheck in 1-2 wk with labs for CBC, BMET Impaired glucose - labs today Vit D deficiency - labs today Alk phos elevation - resolved  [place] was seen today for needs repeat lab work.  Diagnoses and all orders for this visit:  Intractable migraine without aura and with status migrainosus  Leukocytosis Orders: -     Urinalysis Dipstick  Impaired fasting blood sugar Orders: -     Hemoglobin A1c  Vitamin D deficiency Orders: -     Vitamin D, 25-hydroxy  Alkaline phosphatase elevation  Acute frontal sinusitis, recurrence not specified  Other orders -     amoxicillin (AMOXIL) 875 MG tablet; Take 1 tablet (875 mg total) by mouth 2 (two) times daily. -     ondansetron (ZOFRAN) 4 MG tablet; Take 1 tablet (4 mg total) by mouth every  8 (eight) hours as needed for nausea or vomiting.

## 2014-12-13 NOTE — ED Notes (Signed)
Dr. Gentry at bedside. 

## 2014-12-14 ENCOUNTER — Other Ambulatory Visit: Payer: Self-pay | Admitting: Medical

## 2014-12-14 LAB — VITAMIN D 25 HYDROXY (VIT D DEFICIENCY, FRACTURES): Vit D, 25-Hydroxy: 27 ng/mL — ABNORMAL LOW (ref 30–100)

## 2014-12-14 MED ORDER — VITAMIN D (ERGOCALCIFEROL) 1.25 MG (50000 UNIT) PO CAPS: 50000.0000 [IU] | ORAL_CAPSULE | ORAL | Status: DC

## 2015-02-21 ENCOUNTER — Telehealth: Payer: Self-pay | Admitting: Medical

## 2015-02-21 NOTE — Telephone Encounter (Signed)
Pt requesting updated referral to [facility] for her appt on Friday.

## 2015-02-23 NOTE — Telephone Encounter (Signed)
Faxed over referral info to [facility] due to pt insurance

## 2015-05-03 IMAGING — CR DG LUMBAR SPINE COMPLETE 4+V
5 series · 5 of 5 positions shown · non-contrast
Comparison: CT of the abdomen and pelvis May 16, 2012

CLINICAL DATA: Back pain for 2 days, no injury. Pain wraps around
to anterior abdomen.

EXAM:
LUMBAR SPINE - COMPLETE 4+ VIEW

[l-spine ap]
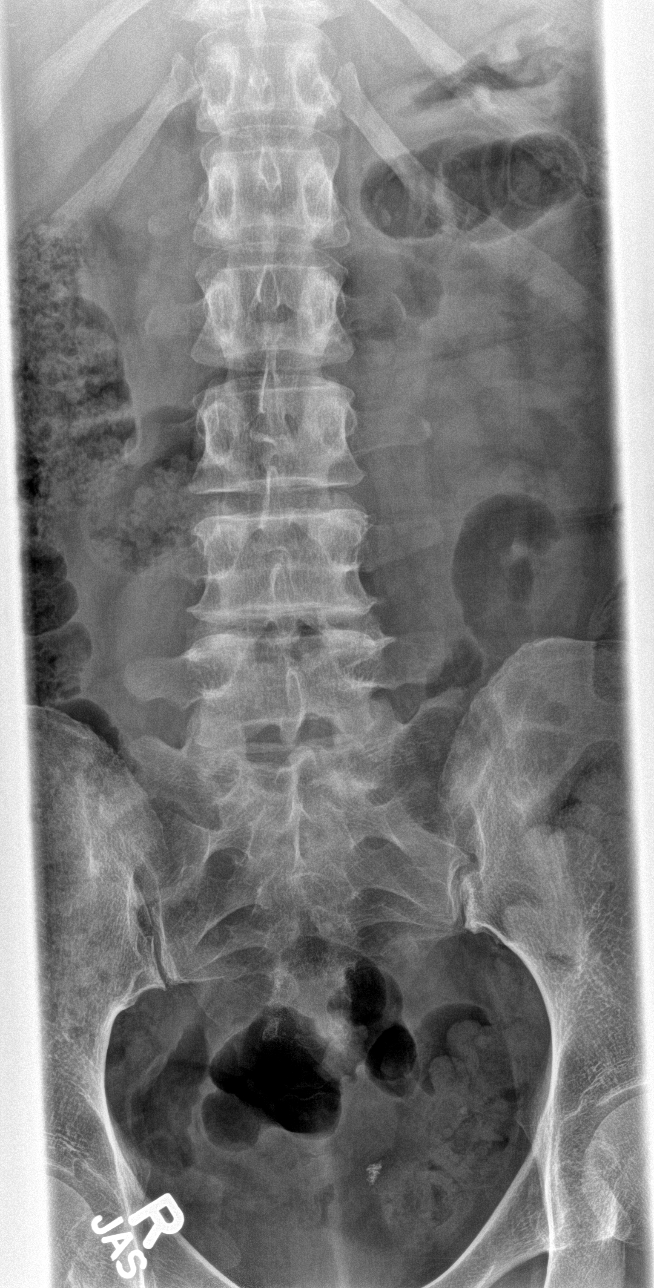

[l-spine obl (1 of 2)]
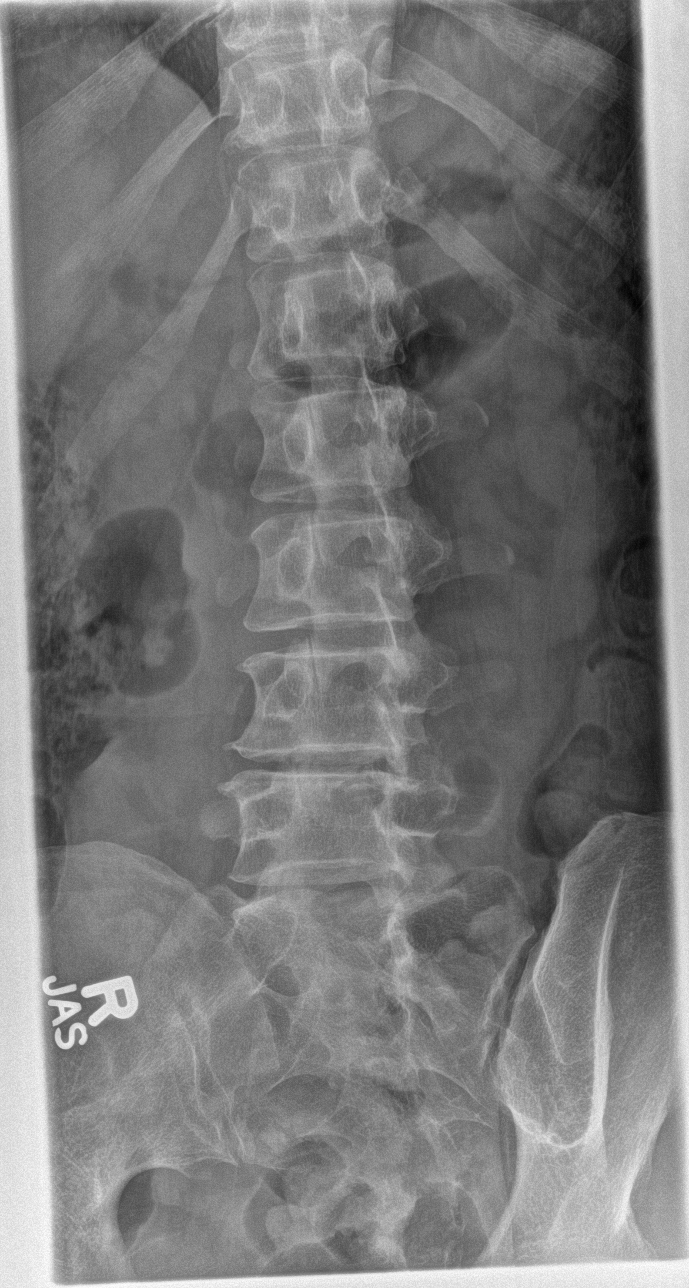

[l-spine obl (2 of 2)]
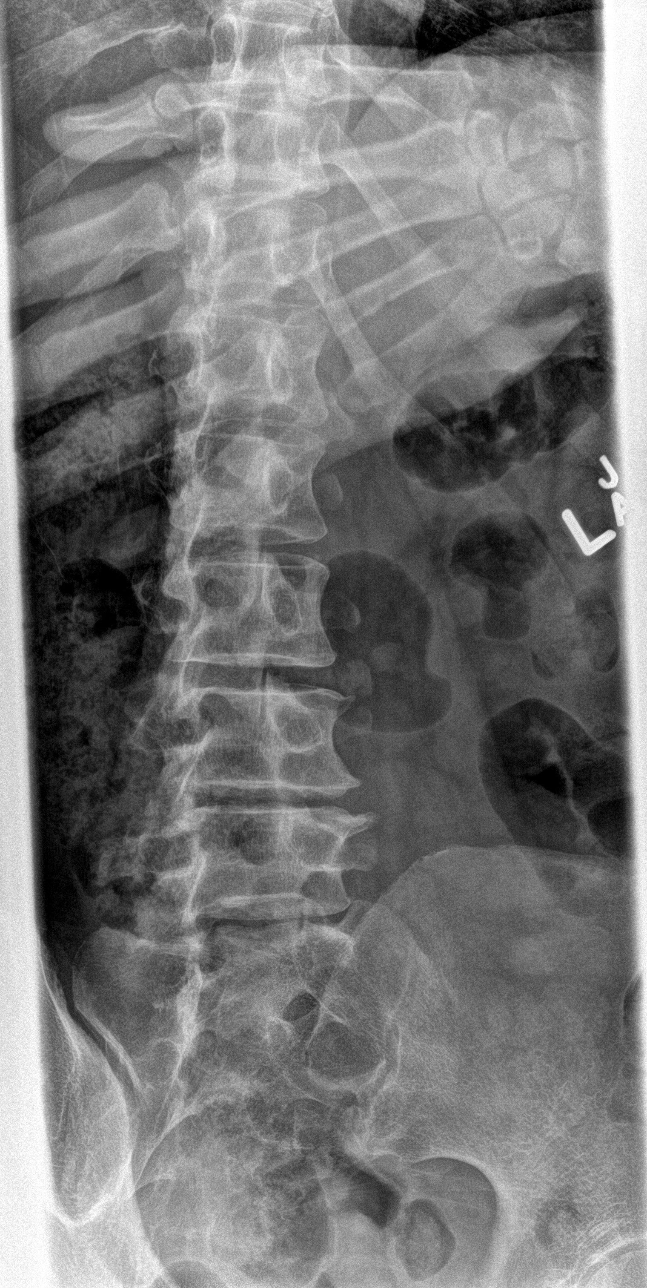

[l-spine lat]
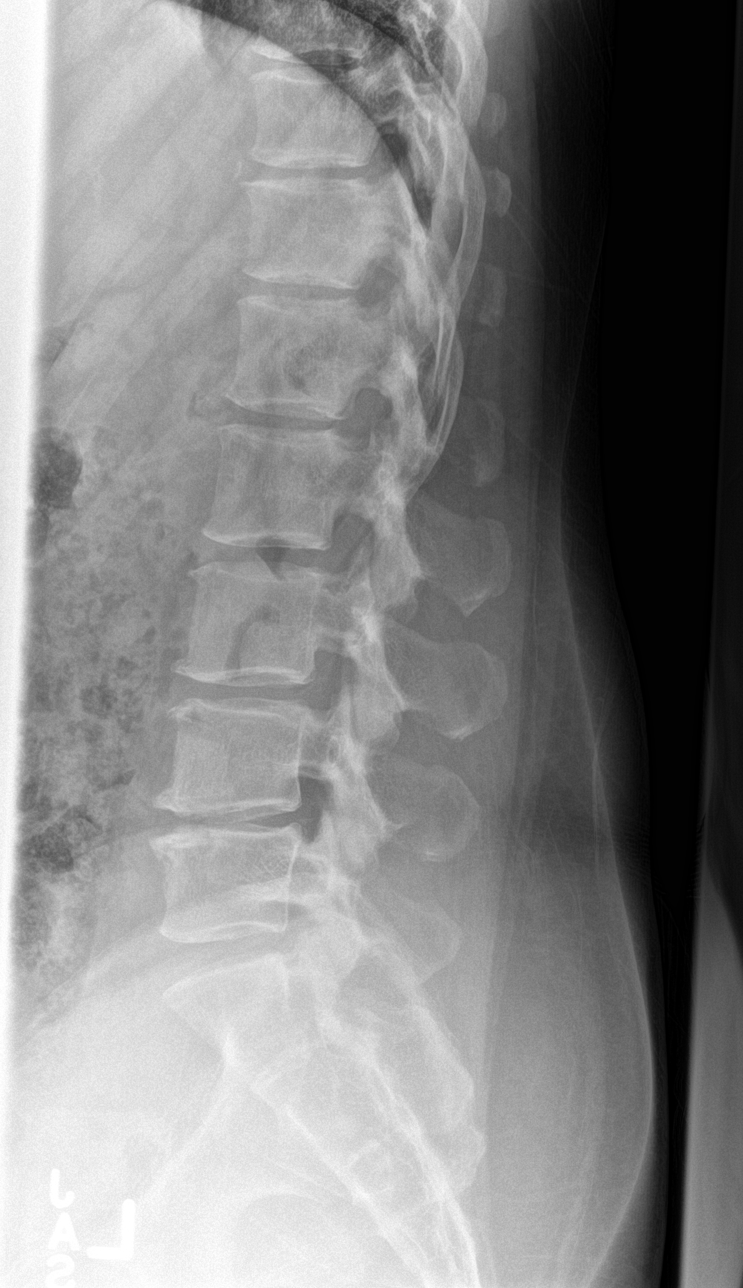

[l-spine spot]
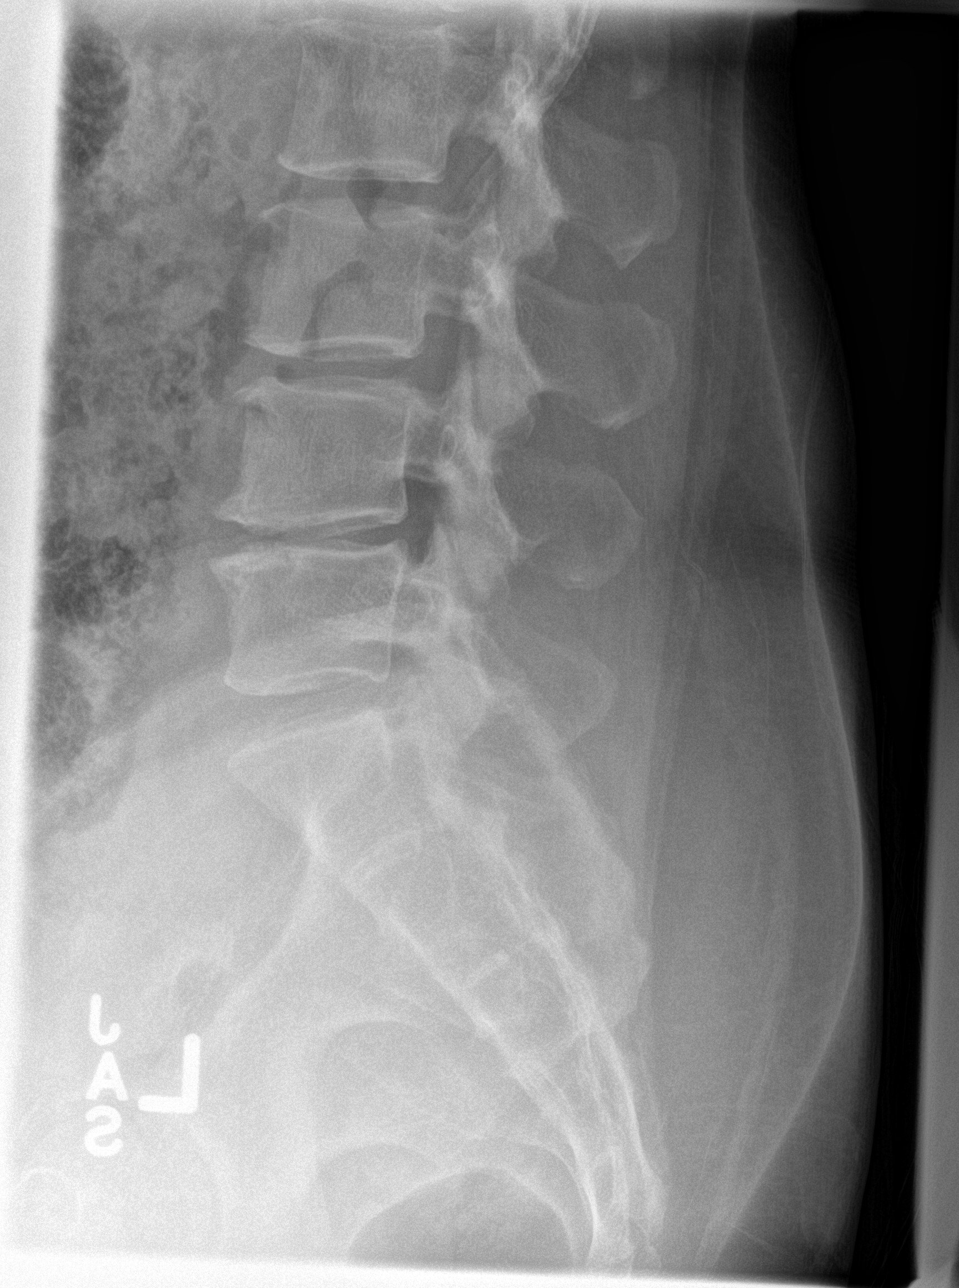

[5 of 5 positions shown; findings below may reference images not displayed]

FINDINGS: Lumbar vertebral bodies and posterior elements appear intact and
aligned, straightened lumbar lordosis. No pars interarticularis
defects. Moderate to severe L4-5 disc height loss, endplate
sclerosis and marginal spurring consistent with degenerative disc,
mild at L3-4. No destructive bony lesions. Sacroiliac joints are
symmetric. Prevertebral and paraspinal soft tissue planes are
nonsuspicious, at least mild amount of retained large bowel stool.
IMPRESSION: Straightened lumbar lordosis without acute fracture deformity or
malalignment. Lower lumbar degenerative discs as seen on prior CT.

  By: Klissman Micolta

## 2015-07-09 ENCOUNTER — Telehealth: Payer: Self-pay | Admitting: Medical

## 2015-07-09 ENCOUNTER — Encounter: Payer: Self-pay | Admitting: Medical

## 2015-07-09 ENCOUNTER — Ambulatory Visit (INDEPENDENT_AMBULATORY_CARE_PROVIDER_SITE_OTHER): Payer: BLUE CROSS/BLUE SHIELD | Admitting: Medical

## 2015-07-09 VITALS — BP 130/90 | HR 70 | Wt 163.0 lb

## 2015-07-09 DIAGNOSIS — Z720 Tobacco use: Secondary | ICD-10-CM

## 2015-07-09 DIAGNOSIS — R030 Elevated blood-pressure reading, without diagnosis of hypertension: Secondary | ICD-10-CM

## 2015-07-09 DIAGNOSIS — L739 Follicular disorder, unspecified: Secondary | ICD-10-CM | POA: Diagnosis not present

## 2015-07-09 DIAGNOSIS — J4541 Moderate persistent asthma with (acute) exacerbation: Secondary | ICD-10-CM | POA: Diagnosis not present

## 2015-07-09 DIAGNOSIS — F17200 Nicotine dependence, unspecified, uncomplicated: Secondary | ICD-10-CM

## 2015-07-09 DIAGNOSIS — G43909 Migraine, unspecified, not intractable, without status migrainosus: Secondary | ICD-10-CM | POA: Diagnosis not present

## 2015-07-09 DIAGNOSIS — R062 Wheezing: Secondary | ICD-10-CM | POA: Diagnosis not present

## 2015-07-09 MED ORDER — ALBUTEROL SULFATE HFA 108 (90 BASE) MCG/ACT IN AERS: 2.0000 | INHALATION_SPRAY | RESPIRATORY_TRACT | Status: DC | PRN

## 2015-07-09 MED ORDER — ALBUTEROL SULFATE (2.5 MG/3ML) 0.083% IN NEBU
2.5000 mg | INHALATION_SOLUTION | Freq: Once | RESPIRATORY_TRACT | Status: AC
  Administered 2015-07-09: 2.5 mg via RESPIRATORY_TRACT

## 2015-07-09 MED ORDER — BUDESONIDE-FORMOTEROL FUMARATE 160-4.5 MCG/ACT IN AERO: 2.0000 | INHALATION_SPRAY | Freq: Two times a day (BID) | RESPIRATORY_TRACT | Status: DC

## 2015-07-09 MED ORDER — MUPIROCIN 2 % EX OINT: 1.0000 "application " | TOPICAL_OINTMENT | Freq: Two times a day (BID) | CUTANEOUS | Status: DC

## 2015-07-09 NOTE — Addendum Note (Signed)
Addended by: Carlena Hurl on: 07/09/2015 02:32 PM   Modules accepted: Orders

## 2015-07-09 NOTE — Progress Notes (Signed)
Subjective: Chief Complaint  Patient presents with  . bp running high    didnt get botox shots bc bp was to high pt was not aware what her bp was running. pt is having neck pain and causing her to have trouble sleeping   Here for concern for BP.   She sees headache clinic with Dr. Domingo Cocking.  recently they would not do botox injections due to BP being elevated.   She has hx/o migraines, but no hx/o hypertension.   Doesn't consume a lot of soda or salt or caffeine.   In general tosses and turns at night, so sleep is not the best.  Had flu like illness recently, using albuterol quite a bit.   No prior use of preventative inhaler.   She is still smoking.   She also notes some recent pain inside her left ear without drainage.  Wonders about ear infection.   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  She does have a BP cuff at home.  No chest pain, palpitations, edema.  No other aggravating or relieving factors. No other complaint.  Past Medical History  Diagnosis Date  . Depression     Follows with [facility], history of voluntary admission to [facility].  History of suisidal ideation with drug od (50 pills of ibuprofen).   . Bronchial asthma   . Tobacco abuse   . History of cocaine abuse     Quit in 2009  . Marijuana abuse     Hx of, quit in 2009  . Alcohol abuse     Hx of, quit in 2009  . Transaminitis     Considered to be secondary to alchol use.   . Adnexal mass 2006    Bilateral ovarian cystic masses- recomended GYN FU.   . Menorrhagia 2006    Endometiral Biopsy- DEGENERATING SECRETORY-TYPE ENDOMETRIUM  . Ankle fracture     Bimalleolar sp closed reduction under floroscopy.   . Normocytic anemia   . Migraine headache      Family History  Problem Relation Age of Onset  . Diabetes Mother   . Hypertension Mother   . Cancer Mother 42    colon  . Hypertension Father   . Stroke Father   . Diabetes Brother   . Obesity Brother   . Heart disease Neg Hx     ROS as in subjective  Objective: BP 130/90 mmHg  Pulse 70  Wt 163 lb (73.936 kg)  LMP 12/24/2012  General appearance: alert, no distress, WD/WN HEENT: normocephalic, sclerae anicteric, small erythematous papular 41mm dieter tender lesion in anterior left ear pinna, TMs pearly, nares patent, no discharge or erythema, pharynx normal Oral cavity: MMM, no lesions Neck: supple, no lymphadenopathy, no thyromegaly, no masses Heart: RRR, normal S1, S2, no murmurs Lungs: diffuse wheezes, decreased lung sounds, no rhonchi, or rales Ext: no edema Pulses: 2+ symmetric, upper and lower extremities, normal cap refill     Assessment: Encounter Diagnoses  Name Primary?  . Wheezing Yes  . Smoking   . Elevated blood pressure reading without diagnosis of hypertension   . Migraine without status migrainosus, not intractable, unspecified migraine type   . Asthma with exacerbation, moderate persistent   . Folliculitis      Plan: PFTs reviewed and are abnormal.  Given her tobacco hx/o, asthma hx/o, begin trial of Symbicort.  Gave sample and discussed proper use.  Can c/t Albuterol q4-6 hr prn.  Advised smoking  cessation but she is not ready to quit.  discussed elevated BP which is likely due to the uncontrolled asthma.  This is likely contributing to the headaches too.  Begin mupirocin for localized folliculitis in left ear pinna. C/t headache therapy with Dr. Domingo Cocking.  Monitor BPs, return in 2wk for physical and recheck on lung function and BP.

## 2015-07-09 NOTE — Telephone Encounter (Signed)
Call her and let her know I sent Mupirocin ointment to use topically to the bump just inside her left ear that is tender.  Use this TID x 1 week

## 2015-07-09 NOTE — Addendum Note (Signed)
Addended by: Billie Lade on: 07/09/2015 03:40 PM   Modules accepted: Orders

## 2015-07-09 NOTE — Telephone Encounter (Signed)
Pt is aware.  

## 2015-07-26 ENCOUNTER — Ambulatory Visit (INDEPENDENT_AMBULATORY_CARE_PROVIDER_SITE_OTHER): Payer: BLUE CROSS/BLUE SHIELD | Admitting: Medical

## 2015-07-26 ENCOUNTER — Encounter: Payer: Self-pay | Admitting: Medical

## 2015-07-26 VITALS — BP 110/70 | HR 59 | Ht 67.0 in | Wt 163.0 lb

## 2015-07-26 DIAGNOSIS — J4540 Moderate persistent asthma, uncomplicated: Secondary | ICD-10-CM

## 2015-07-26 DIAGNOSIS — Z800 Family history of malignant neoplasm of digestive organs: Secondary | ICD-10-CM | POA: Diagnosis not present

## 2015-07-26 DIAGNOSIS — Z87898 Personal history of other specified conditions: Secondary | ICD-10-CM | POA: Diagnosis not present

## 2015-07-26 DIAGNOSIS — Z8669 Personal history of other diseases of the nervous system and sense organs: Secondary | ICD-10-CM

## 2015-07-26 DIAGNOSIS — E559 Vitamin D deficiency, unspecified: Secondary | ICD-10-CM | POA: Diagnosis not present

## 2015-07-26 DIAGNOSIS — Z0000 Encounter for general adult medical examination without abnormal findings: Secondary | ICD-10-CM

## 2015-07-26 DIAGNOSIS — R0600 Dyspnea, unspecified: Secondary | ICD-10-CM

## 2015-07-26 DIAGNOSIS — F1911 Other psychoactive substance abuse, in remission: Secondary | ICD-10-CM | POA: Insufficient documentation

## 2015-07-26 DIAGNOSIS — Z720 Tobacco use: Secondary | ICD-10-CM | POA: Diagnosis not present

## 2015-07-26 DIAGNOSIS — Z7189 Other specified counseling: Secondary | ICD-10-CM | POA: Diagnosis not present

## 2015-07-26 DIAGNOSIS — R7301 Impaired fasting glucose: Secondary | ICD-10-CM | POA: Diagnosis not present

## 2015-07-26 DIAGNOSIS — Z7185 Encounter for immunization safety counseling: Secondary | ICD-10-CM

## 2015-07-26 LAB — CBC WITH DIFFERENTIAL/PLATELET
BASOS ABS: 0 10*3/uL (ref 0.0–0.1)
Basophils Relative: 0 % (ref 0–1)
EOS PCT: 1 % (ref 0–5)
Eosinophils Absolute: 0.1 10*3/uL (ref 0.0–0.7)
HCT: 41.3 % (ref 36.0–46.0)
Hemoglobin: 13.6 g/dL (ref 12.0–15.0)
LYMPHS ABS: 3.6 10*3/uL (ref 0.7–4.0)
Lymphocytes Relative: 34 % (ref 12–46)
MCH: 31 pg (ref 26.0–34.0)
MCHC: 32.9 g/dL (ref 30.0–36.0)
MCV: 94.1 fL (ref 78.0–100.0)
MPV: 10.3 fL (ref 8.6–12.4)
Monocytes Absolute: 0.6 10*3/uL (ref 0.1–1.0)
Monocytes Relative: 6 % (ref 3–12)
Neutro Abs: 6.3 10*3/uL (ref 1.7–7.7)
Neutrophils Relative %: 59 % (ref 43–77)
Platelets: 199 10*3/uL (ref 150–400)
RBC: 4.39 MIL/uL (ref 3.87–5.11)
RDW: 13.7 % (ref 11.5–15.5)
WBC: 10.7 10*3/uL — AB (ref 4.0–10.5)

## 2015-07-26 LAB — TSH: TSH: 0.81 m[IU]/L

## 2015-07-26 LAB — LIPID PANEL
Cholesterol: 178 mg/dL (ref 125–200)
HDL: 68 mg/dL (ref >=46)
LDL CALC: 98 mg/dL (ref <130)
Total CHOL/HDL Ratio: 2.6 Ratio (ref <=5.0)
Triglycerides: 59 mg/dL (ref <150)
VLDL: 12 mg/dL (ref <30)

## 2015-07-26 LAB — HEPATIC FUNCTION PANEL
ALBUMIN: 4.2 g/dL (ref 3.6–5.1)
ALT: 12 U/L (ref 6–29)
AST: 17 U/L (ref 10–35)
Alkaline Phosphatase: 126 U/L — ABNORMAL HIGH (ref 33–115)
BILIRUBIN DIRECT: 0.1 mg/dL (ref <=0.2)
BILIRUBIN TOTAL: 0.4 mg/dL (ref 0.2–1.2)
Indirect Bilirubin: 0.3 mg/dL (ref 0.2–1.2)
Total Protein: 6.7 g/dL (ref 6.1–8.1)

## 2015-07-26 LAB — BASIC METABOLIC PANEL
BUN: 14 mg/dL (ref 7–25)
CHLORIDE: 106 mmol/L (ref 98–110)
CO2: 24 mmol/L (ref 20–31)
CREATININE: 0.97 mg/dL (ref 0.50–1.10)
Calcium: 9.9 mg/dL (ref 8.6–10.2)
Glucose, Bld: 88 mg/dL (ref 65–99)
Potassium: 3.8 mmol/L (ref 3.5–5.3)
Sodium: 140 mmol/L (ref 135–146)

## 2015-07-26 LAB — HEMOGLOBIN A1C
Hgb A1c MFr Bld: 6 % — ABNORMAL HIGH (ref <5.7)
MEAN PLASMA GLUCOSE: 126 mg/dL — AB (ref <117)

## 2015-07-26 MED ORDER — ALBUTEROL SULFATE HFA 108 (90 BASE) MCG/ACT IN AERS: 2.0000 | INHALATION_SPRAY | Freq: Four times a day (QID) | RESPIRATORY_TRACT | Status: DC | PRN

## 2015-07-26 NOTE — Progress Notes (Addendum)
Subjective:   HPI  [name] is a 50 y.o. female who presents for a complete physical.  Medical care team includes:  Dr. Orie Rout, [facility]  Dr. Louretta Shorten, gynecology  Dorothea Ogle, PA-C here for primary care    Past Medical History  Diagnosis Date  . Depression     Follows with [facility], history of voluntary admission to [facility].  History of suisidal ideation with drug od (50 pills of ibuprofen).   . Bronchial asthma   . Tobacco abuse   . History of cocaine abuse     Quit in 2009  . Marijuana abuse     Hx of, quit in 2009  . Alcohol abuse     Hx of, quit in 2009  . Transaminitis     Considered to be secondary to alchol use.   . Adnexal mass 2006    Bilateral ovarian cystic masses- recomended GYN FU.   . Menorrhagia 2006    Endometiral Biopsy- DEGENERATING SECRETORY-TYPE ENDOMETRIUM  . Ankle fracture     Bimalleolar sp closed reduction under floroscopy.   . Normocytic anemia   . Migraine headache     Past Surgical History  Procedure Laterality Date  . Close reduction of bimalleolar ankle fracture      left  . Laparoscopic appendectomy  07/10/2011    Procedure: APPENDECTOMY LAPAROSCOPIC;  Surgeon: Rolm Bookbinder, MD;  Location: [facility];  Service: General;  Laterality: N/A;  . Bartholin gland cyst excision  2015  . Laparoscopy N/A 07/18/2014    Procedure: LAPAROSCOPY OPERATIVE WITH ENDOCATCH;  Surgeon: Luz Lex, MD;  Location: [place] [facility];  Service: Gynecology;  Laterality: N/A;  . Bilateral salpingectomy Bilateral 07/18/2014    Procedure: BILATERAL SALPINGECTOMY;  Surgeon: Luz Lex, MD;  Location: [place] [facility];  Service: Gynecology;  Laterality: Bilateral;  . Lysis of adhesion N/A 07/18/2014    Procedure: LYSIS OF ADHESION;  Surgeon: Luz Lex, MD;  Location: [place] [facility];  Service: Gynecology;  Laterality: N/A;    Social History   Social History  . Marital Status: Single    Spouse Name: N/A  . Number of Children: N/A  . Years of Education:  N/A   Occupational History  . Not on file.   Social History Main Topics  . Smoking status: Current Every Day Smoker -- 0.50 packs/day for 10 years    Types: Cigarettes  . Smokeless tobacco: Not on file  . Alcohol Use: No     Comment: .   Marland Kitchen Drug Use: Yes    Special: Marijuana     Comment: Quit Coccaine and marijuana in 2009  . Sexual Activity: Not on file   Other Topics Concern  . Not on file   Social History Narrative   Lives in [place] with her mom, single, never married, no kids.  Works at [employer] with [employer] and [employer] x 6 years.  Sleeps a lot, works 3 rd shift .  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    Family History  Problem Relation Age of Onset  . Diabetes Mother   . Hypertension Mother   . Cancer Mother 67    colon  . Hypertension Father   . Stroke Father   . Diabetes Brother   . Obesity Brother   . Hypertension Brother   . Heart disease Neg Hx      Current outpatient prescriptions:  .  baclofen (LIORESAL) 10 MG tablet, Take 10  mg by mouth daily as needed (headache). , Disp: , Rfl: 0 .  budesonide-formoterol (SYMBICORT) 160-4.5 MCG/ACT inhaler, Inhale 2 puffs into the lungs 2 (two) times daily., Disp: 1 Inhaler, Rfl: 12 .  Cholecalciferol (VITAMIN D) 2000 UNITS tablet, Take 1 tablet (2,000 Units total) by mouth daily., Disp: 90 tablet, Rfl: 3 .  fluticasone (CUTIVATE) 0.05 % cream, Apply topically 2 (two) times daily., Disp: 30 g, Rfl: 1 .  Vitamin D, Ergocalciferol, (DRISDOL) 50000 UNITS CAPS capsule, Take 1 capsule (50,000 Units total) by mouth every 7 (seven) days., Disp: 52 capsule, Rfl: 0 .  zonisamide (ZONEGRAN) 100 MG capsule, Take 200 mg by mouth daily., Disp: , Rfl: 2 .  albuterol (VENTOLIN HFA) 108 (90 Base) MCG/ACT inhaler, Inhale 2 puffs into the lungs every 4 (four) hours as needed. For shortness of breath. (Patient not taking: Reported on 07/26/2015), Disp: 18 g, Rfl: 2 .  estradiol (ESTRACE) 2 MG tablet, Take 2 mg by mouth  daily. Reported on 07/26/2015, Disp: , Rfl: 6 .  gabapentin (NEURONTIN) 100 MG capsule, Take 1 capsule (100 mg total) by mouth at bedtime. (Patient not taking: Reported on 12/13/2014), Disp: 30 capsule, Rfl: 1 .  HYDROcodone-acetaminophen (NORCO/VICODIN) 5-325 MG per tablet, Take 1-2 tablets by mouth every 6 (six) hours as needed for moderate pain or severe pain. (Patient not taking: Reported on 07/09/2015), Disp: 30 tablet, Rfl: 0 .  methocarbamol (ROBAXIN) 500 MG tablet, Take 1 tablet (500 mg total) by mouth 2 (two) times daily. (Patient not taking: Reported on 07/09/2015), Disp: 20 tablet, Rfl: 0 .  mupirocin ointment (BACTROBAN) 2 %, Place 1 application into the nose 2 (two) times daily. (Patient not taking: Reported on 07/26/2015), Disp: 22 g, Rfl: 0 .  naproxen (NAPROSYN) 500 MG tablet, Take 1 tablet (500 mg total) by mouth 2 (two) times daily with a meal. (Patient not taking: Reported on 12/13/2014), Disp: 30 tablet, Rfl: 0 .  ondansetron (ZOFRAN) 4 MG tablet, Take 1 tablet (4 mg total) by mouth every 8 (eight) hours as needed for nausea or vomiting. (Patient not taking: Reported on 07/09/2015), Disp: 20 tablet, Rfl: 0 .  progesterone (PROMETRIUM) 100 MG capsule, Reported on 07/26/2015, Disp: , Rfl: 11  No Known Allergies  Reviewed their medical, surgical, family, social, medication, and allergy history and updated chart as appropriate.  Review of Systems Constitutional: -fever, -chills, -sweats, -unexpected weight change, -decreased appetite, -fatigue Allergy: -sneezing, -itching, -congestion Dermatology: -changing moles, --rash, -lumps ENT: -runny nose, -ear pain, -sore throat, -hoarseness, -sinus pain, -teeth pain, - ringing in ears, -hearing loss, -nosebleeds Cardiology: -chest pain, -palpitations, -swelling, -difficulty breathing when lying flat, -waking up short of breath Respiratory: -cough, -shortness of breath, -difficulty breathing with exercise or exertion, -wheezing, -coughing up  blood Gastroenterology: -abdominal pain, -nausea, -vomiting, -diarrhea, -constipation, -blood in stool, -changes in bowel movement, -difficulty swallowing or eating Hematology: -bleeding, -bruising  Musculoskeletal: -joint aches, -muscle aches, -joint swelling, -back pain, -neck pain, -cramping, -changes in gait Ophthalmology: denies vision changes, eye redness, itching, discharge Urology: -burning with urination, -difficulty urinating, -blood in urine, -urinary frequency, -urgency, -incontinence Neurology: -headache, -weakness, -tingling, -numbness, -memory loss, -falls, -dizziness Psychology: -depressed mood, -agitation, -sleep problems     Objective:   Physical Exam  BP 110/70 mmHg  Pulse 59  Ht 5\' 7"  (1.702 m)  Wt 163 lb (73.936 kg)  BMI 25.52 kg/m2  LMP 12/24/2012  General appearance: alert, no distress, WD/WN, AA female Skin: few scattered macules, no worrisome lesions HEENT: normocephalic,  conjunctiva/corneas normal, sclerae anicteric, bluish hue around bilat iris circumferentially, PERRLA, EOMi, nares patent, no discharge or erythema, pharynx normal Oral cavity: MMM, tongue normal, teeth with plaque, othweise no lesions Neck: supple, no lymphadenopathy, no thyromegaly, no masses, normal ROM, no bruits Chest: non tender, normal shape and expansion Heart: RRR, normal S1, S2, no murmurs Lungs:decrease breath sounds in general, no wheezes, rhonchi, or rales Abdomen: +bs, soft, surgical dressing over lower central abdomen, mild generalized tenderness, s/p recent surgery, non distended, no masses, no hepatomegaly, no splenomegaly, no bruits Back: non tender, normal ROM, no scoliosis Musculoskeletal: upper extremities non tender, no obvious deformity, normal ROM throughout, lower extremities non tender, no obvious deformity, normal ROM throughout Extremities: no edema, no cyanosis, no clubbing Pulses: 2+ symmetric, upper and lower extremities, normal cap refill Neurological: alert,  oriented x 3, CN2-12 intact, strength normal upper extremities and lower extremities, sensation normal throughout, DTRs 2+ throughout, no cerebellar signs, gait normal Psychiatric: normal affect, behavior normal, pleasant  Breast/gyn/rectal - deferred to gynecology   Adult ECG Report  Indication: chest pain  Rate: 59 bpm  Rhythm: sinus bradycardia  QRS Axis: 65 degrees  PR Interval: 124ms  QRS Duration: 62ms  QTc: 476ms  Conduction Disturbances: none  Other Abnormalities: none  Patient's cardiac risk factors are: smoking/ tobacco exposure.  EKG comparison: none  Narrative Interpretation: sinus bradycardia     Assessment and Plan :    Encounter Diagnoses  Name Primary?  . Encounter for health maintenance examination in adult Yes  . History of migraine headaches   . Tobacco use   . Vitamin D deficiency   . Impaired fasting blood sugar   . Family history of colon cancer   . Vaccine counseling   . Dyspnea   . Asthma, moderate persistent, uncomplicated   . History of substance abuse     Physical exam - discussed healthy lifestyle, diet, exercise, preventative care, vaccinations, and addressed their concerns.  Handout given. See your eye doctor yearly for routine vision care. See your dentist yearly for routine dental care including hygiene visits twice yearly. See your gynecologist yearly for routine gynecological care.  Dsypnea/asthma, likely COPD picture - rewviewd recent PFTs basic here.  We don't have post broncchodilator challenge PFTs at this time.   She is seeing imporvment on Symbicort.   C/t albuterol prn.   She will check the age of her mother's onset of colon cancer so we can decide on when she will need a colonoscopy  Migraines - followed by headache clinic  Smoking - advised cessation  Vit D deficiency - c/t Vit D 2000 IU daily  impaird glucose - labs today  She smoked some marijuana recently.   Heavily recommended she stay away completely given her  history.  Follow-up pending labs

## 2015-07-27 ENCOUNTER — Telehealth: Payer: Self-pay | Admitting: Medical

## 2015-07-27 NOTE — Telephone Encounter (Signed)
LMTCB

## 2015-07-27 NOTE — Telephone Encounter (Signed)
Please call patient.  I forgot to ask about this.  We work with a company that does research studies and medication trials, called [employer].   They are currently doing a study on a medication for COPD/breathing.  Participants in the study receive compensation, free medication, close follow up and monitoring.   I wanted to let them know about this study because she qualifies for this study.  See if they have any interest in being part of a study.  If agreeable, pass on his info to [employer] and let me know.  If they desire not to be part of the study, that is fine.   We will continue their current medications and/or treatment as usual.

## 2015-07-30 NOTE — Telephone Encounter (Signed)
Left detailed on pts voicemail.

## 2015-08-09 NOTE — Addendum Note (Signed)
Addended by: Minette Headland A on: 08/09/2015 10:01 AM   Modules accepted: Orders, SmartSet

## 2015-10-05 ENCOUNTER — Telehealth: Payer: Self-pay | Admitting: Medical

## 2015-10-05 ENCOUNTER — Other Ambulatory Visit: Payer: Self-pay | Admitting: Medical

## 2015-10-05 MED ORDER — VITAMIN D (ERGOCALCIFEROL) 1.25 MG (50000 UNIT) PO CAPS: 50000.0000 [IU] | ORAL_CAPSULE | ORAL | Status: DC

## 2015-10-05 NOTE — Telephone Encounter (Signed)
Rcvd refill request for Vit D2 1.25 mg 50,000 unit #52

## 2015-11-13 ENCOUNTER — Telehealth: Payer: Self-pay

## 2015-11-13 NOTE — Telephone Encounter (Signed)
LM for pt to CB in regards to answering machine message. Victorino December

## 2015-11-15 ENCOUNTER — Ambulatory Visit (INDEPENDENT_AMBULATORY_CARE_PROVIDER_SITE_OTHER): Payer: BLUE CROSS/BLUE SHIELD | Admitting: Family Medicine

## 2015-11-15 ENCOUNTER — Encounter: Payer: Self-pay | Admitting: Family Medicine

## 2015-11-15 VITALS — BP 130/88 | HR 72 | Temp 97.5°F | Wt 169.8 lb

## 2015-11-15 DIAGNOSIS — H6091 Unspecified otitis externa, right ear: Secondary | ICD-10-CM

## 2015-11-15 MED ORDER — NEOMYCIN-POLYMYXIN-HC 3.5-10000-1 OT SUSP: 3.0000 [drp] | Freq: Three times a day (TID) | OTIC | Status: DC

## 2015-11-15 NOTE — Progress Notes (Signed)
   Subjective:    Patient ID: [name], female    DOB: 10-27-1965, 50 y.o.   MRN: [id number]  HPI She complains of a one-week history of right ear pain and some ear fullness and popping but no drainage, sore throat, earache, neck pain, fever or chills.   Review of Systems     Objective:   Physical Exam Left TM and canal is normal. Right TM appears normal, canal is not erythematous however manipulation with the speculum discussed cause some discomfort. She also has slight discomfort and pulling on ears.       Assessment & Plan:  Right otitis externa - Plan: neomycin-polymyxin-hydrocortisone (CORTISPORIN) 3.5-10000-1 otic suspension Recommend Advil or Tylenol for pain relief and also use Afrin nasal spray for the next 3 days to see if that will help with her eustachian tube dysfunction that she was describing.

## 2015-12-04 ENCOUNTER — Other Ambulatory Visit: Payer: Self-pay | Admitting: Medical

## 2015-12-04 ENCOUNTER — Telehealth: Payer: Self-pay | Admitting: Internal Medicine

## 2015-12-04 ENCOUNTER — Encounter: Payer: Self-pay | Admitting: Medical

## 2015-12-04 ENCOUNTER — Ambulatory Visit (INDEPENDENT_AMBULATORY_CARE_PROVIDER_SITE_OTHER): Payer: BLUE CROSS/BLUE SHIELD | Admitting: Medical

## 2015-12-04 ENCOUNTER — Telehealth: Payer: Self-pay | Admitting: Medical

## 2015-12-04 ENCOUNTER — Ambulatory Visit
Admission: RE | Admit: 2015-12-04 | Discharge: 2015-12-04 | Disposition: A | Discharge status: Home / Self Care | Payer: BLUE CROSS/BLUE SHIELD | Source: Ambulatory Visit | Attending: Medical | Admitting: Medical

## 2015-12-04 VITALS — BP 138/104 | HR 67 | Wt 168.0 lb

## 2015-12-04 DIAGNOSIS — R109 Unspecified abdominal pain: Secondary | ICD-10-CM

## 2015-12-04 DIAGNOSIS — K5909 Other constipation: Secondary | ICD-10-CM

## 2015-12-04 DIAGNOSIS — K5900 Constipation, unspecified: Secondary | ICD-10-CM

## 2015-12-04 DIAGNOSIS — R110 Nausea: Secondary | ICD-10-CM | POA: Diagnosis not present

## 2015-12-04 DIAGNOSIS — Z800 Family history of malignant neoplasm of digestive organs: Secondary | ICD-10-CM

## 2015-12-04 LAB — CBC WITH DIFFERENTIAL/PLATELET
BASOS ABS: 0 {cells}/uL (ref 0–200)
Basophils Relative: 0 %
Eosinophils Absolute: 112 cells/uL (ref 15–500)
Eosinophils Relative: 1 %
HCT: 42.6 % (ref 35.0–45.0)
HEMOGLOBIN: 13.9 g/dL (ref 11.7–15.5)
Lymphocytes Relative: 33 %
Lymphs Abs: 3696 cells/uL (ref 850–3900)
MCH: 31.3 pg (ref 27.0–33.0)
MCHC: 32.6 g/dL (ref 32.0–36.0)
MCV: 95.9 fL (ref 80.0–100.0)
MONOS PCT: 8 %
MPV: 10 fL (ref 7.5–12.5)
Monocytes Absolute: 896 cells/uL (ref 200–950)
NEUTROS PCT: 58 %
Neutro Abs: 6496 cells/uL (ref 1500–7800)
Platelets: 192 10*3/uL (ref 140–400)
RBC: 4.44 MIL/uL (ref 3.80–5.10)
RDW: 13.3 % (ref 11.0–15.0)
WBC: 11.2 10*3/uL — ABNORMAL HIGH (ref 4.0–10.5)

## 2015-12-04 LAB — HEPATIC FUNCTION PANEL
ALK PHOS: 120 U/L — AB (ref 33–115)
ALT: 10 U/L (ref 6–29)
AST: 13 U/L (ref 10–35)
Albumin: 4.2 g/dL (ref 3.6–5.1)
BILIRUBIN DIRECT: 0.1 mg/dL (ref <=0.2)
BILIRUBIN INDIRECT: 0.3 mg/dL (ref 0.2–1.2)
Total Bilirubin: 0.4 mg/dL (ref 0.2–1.2)
Total Protein: 6.4 g/dL (ref 6.1–8.1)

## 2015-12-04 LAB — BASIC METABOLIC PANEL
BUN: 18 mg/dL (ref 7–25)
CO2: 24 mmol/L (ref 20–31)
Calcium: 9.3 mg/dL (ref 8.6–10.2)
Chloride: 110 mmol/L (ref 98–110)
Creat: 1.09 mg/dL (ref 0.50–1.10)
GLUCOSE: 83 mg/dL (ref 65–99)
Potassium: 4 mmol/L (ref 3.5–5.3)
Sodium: 141 mmol/L (ref 135–146)

## 2015-12-04 LAB — POCT URINALYSIS DIPSTICK
BILIRUBIN UA: NEGATIVE
Blood, UA: NEGATIVE
GLUCOSE UA: NEGATIVE
Ketones, UA: NEGATIVE
NITRITE UA: NEGATIVE
Protein, UA: NEGATIVE
Spec Grav, UA: 1.015
Urobilinogen, UA: NEGATIVE
pH, UA: 7.5

## 2015-12-04 LAB — POCT URINE PREGNANCY: Preg Test, Ur: NEGATIVE

## 2015-12-04 LAB — LIPASE: Lipase: 28 U/L (ref 7–60)

## 2015-12-04 MED ORDER — IOPAMIDOL (ISOVUE-300) INJECTION 61%
100.0000 mL | Freq: Once | INTRAVENOUS | Status: AC | PRN
  Administered 2015-12-04: 100 mL via INTRAVENOUS

## 2015-12-04 MED ORDER — KETOROLAC TROMETHAMINE 60 MG/2ML IM SOLN
60.0000 mg | Freq: Once | INTRAMUSCULAR | Status: AC
  Administered 2015-12-04: 60 mg via INTRAMUSCULAR

## 2015-12-04 MED ORDER — OXYCODONE-ACETAMINOPHEN 5-325 MG PO TABS: 1.0000 | ORAL_TABLET | ORAL | Status: DC | PRN

## 2015-12-04 MED ORDER — POLYETHYLENE GLYCOL 3350 17 GM/SCOOP PO POWD: 1.0000 | Freq: Every day | ORAL | Status: DC

## 2015-12-04 NOTE — Telephone Encounter (Signed)
Prior auth for CT abdomen/pelvis approval [id number] valid today through 01-02-16

## 2015-12-04 NOTE — Telephone Encounter (Signed)
-----   Message from Carlena Hurl, PA-C sent at 12/04/2015  4:00 PM EDT ----- CT interestingly was normal except for constipation.  Have her begin Miralax powder every hour until a bowel movement, then can use this daily.  Increase water intake, try and drink enough water to where urine is clear, and at least 64oz daily.  Use the pain medication only for worse pain as this can worsen constipation.   Lets refer her to GI for chronic constipation, family history of colon cancer.

## 2015-12-04 NOTE — Progress Notes (Signed)
Subjective: Chief Complaint  Patient presents with  . Abdominal Pain    hurting from her stomach around her lt side to her back. 10 on 0-10 pain scale. last bowel movement was last night   Here for 2-3 days of pain.  Started with pain in left back, radiating to left abdomen 2 days ago.  Constant pain.  Is worsening.   Denies fever, +nausea, no vomiting.   Last night BM was little balls, has constipation often, chronic, although she hasn't brought this up prior.  Prior to last night no BM in a week, but this is not uncommon for her.  Has burning with urination, no blood in urine, no urine odor.  No vaginal c/o.  No fall, no trauma.  No prior hx/o kidney stone.  No hx/o pancreatitis.   No UTI in many years.  No vaginal bleeding.   Gets nauseated with eating, particularly this morning, eating makes the pain worse.  No recent alcohol use, sober for years.   No hx/o high triglycerides.   Using nothing for symptoms.   Requesting something to help with pain.  No other aggravating or relieving factors. No other complaint.   Past Medical History  Diagnosis Date  . Depression     Follows with [facility], history of voluntary admission to [facility].  History of suisidal ideation with drug od (50 pills of ibuprofen).   . Bronchial asthma   . Tobacco abuse   . History of cocaine abuse     Quit in 2009  . Marijuana abuse     Hx of, quit in 2009  . Alcohol abuse     Hx of, quit in 2009  . Transaminitis     Considered to be secondary to alchol use.   . Adnexal mass 2006    Bilateral ovarian cystic masses- recomended GYN FU.   . Menorrhagia 2006    Endometiral Biopsy- DEGENERATING SECRETORY-TYPE ENDOMETRIUM  . Ankle fracture     Bimalleolar sp closed reduction under floroscopy.   . Normocytic anemia   . Migraine headache    Past Surgical History  Procedure Laterality Date  . Close reduction of bimalleolar ankle fracture      left  . Laparoscopic appendectomy  07/10/2011    Procedure: APPENDECTOMY LAPAROSCOPIC;   Surgeon: Rolm Bookbinder, MD;  Location: [facility];  Service: General;  Laterality: N/A;  . Bartholin gland cyst excision  2015  . Laparoscopy N/A 07/18/2014    Procedure: LAPAROSCOPY OPERATIVE WITH ENDOCATCH;  Surgeon: Luz Lex, MD;  Location: [place] [facility];  Service: Gynecology;  Laterality: N/A;  . Bilateral salpingectomy Bilateral 07/18/2014    Procedure: BILATERAL SALPINGECTOMY;  Surgeon: Luz Lex, MD;  Location: [place] [facility];  Service: Gynecology;  Laterality: Bilateral;  . Lysis of adhesion N/A 07/18/2014    Procedure: LYSIS OF ADHESION;  Surgeon: Luz Lex, MD;  Location: [place] [facility];  Service: Gynecology;  Laterality: N/A;     Objective: BP 138/104 mmHg  Pulse 67  Wt 168 lb (76.204 kg)  LMP 12/24/2012  BP Readings from Last 3 Encounters:  12/04/15 138/104  11/15/15 130/88  07/26/15 110/70   Wt Readings from Last 3 Encounters:  12/04/15 168 lb (76.204 kg)  11/15/15 169 lb 12.8 oz (77.021 kg)  07/26/15 163 lb (73.936 kg)   General appearance: alert, in pain lying on exam table, moaning, wd, wn  Skin: no warmth, erythema or rash Heart: RRR, normal s1, s2, no murmurs Lungs: CTA bilaterally, no wheezes, rhonchi, or  rales Abdomen: +somewhat decreased bs, soft, tender throughout left abdomen even with mild palpation, right side non tender, non distended, no masses, no hepatomegaly, but unable to deep palpate left side given pain, no splenomegaly Back: somewhat tender left flank, but otherwise nontender, mild pain with flexion and left leg SLR Legs nontender, hip ROM without pain Pulses: 2+ symmetric, upper and lower extremities, normal cap refill Ext: no edema    Assessment: Encounter Diagnoses  Name Primary?  . Left sided abdominal pain Yes  . Left flank pain   . Constipation, unspecified constipation type   . Nausea without vomiting      Plan: discussed differential.  Possible renal stone, but can't rule out bowel obstruction or other.    60mg  Toradol IM given for pain  now.  Her mother drove her here.  Will get STAT labs, CT abdomen and pelvis  She has zofran at home she can use for pain.  Gave strainer to strain urine in the event of stone  [name] was seen today for abdominal pain.  Diagnoses and all orders for this visit:  Left sided abdominal pain -     CBC with Differential/Platelet -     Basic metabolic panel -     Hepatic Function Panel -     Lipase -     CT Abdomen Pelvis W Contrast; Future  Left flank pain -     CBC with Differential/Platelet -     Basic metabolic panel -     Hepatic Function Panel -     Lipase -     CT Abdomen Pelvis W Contrast; Future  Constipation, unspecified constipation type -     CBC with Differential/Platelet -     Basic metabolic panel -     Hepatic Function Panel -     Lipase -     CT Abdomen Pelvis W Contrast; Future  Nausea without vomiting -     CBC with Differential/Platelet -     Basic metabolic panel -     Hepatic Function Panel -     Lipase -     CT Abdomen Pelvis W Contrast; Future  Other orders -     oxyCODONE-acetaminophen (PERCOCET) 5-325 MG tablet; Take 1 tablet by mouth every 4 (four) hours as needed for severe pain.

## 2015-12-04 NOTE — Telephone Encounter (Signed)
[name] at [facility] [employer] called re miralax directions  Please call  She wanted to know if patient is supposed to take whole bottle ( per directions sent)

## 2015-12-04 NOTE — Telephone Encounter (Signed)
Pharmacy was notified of directions per lab results and pt is aware of how to take it

## 2015-12-05 NOTE — Telephone Encounter (Signed)
LMTCB

## 2015-12-05 NOTE — Telephone Encounter (Signed)
Pt is aware and will call back tomorrow if still no bowel movement

## 2015-12-05 NOTE — Telephone Encounter (Signed)
Make sure she is using the Miralax, make sure we refer to GI  Loma Sousa - have Melissa pull a [place] Controlled Substance report on her

## 2015-12-05 NOTE — Telephone Encounter (Signed)
Pt states that she has been taking every hour and she has not used the bathroom yet. Started around 9am. Is drinking plenty of water.

## 2015-12-05 NOTE — Telephone Encounter (Signed)
Melissa can you pull report?

## 2015-12-05 NOTE — Telephone Encounter (Signed)
Have her take miralax once daily the next few days, but try taking some Milk of Magnesia OTC.   This will usually work if miralax isn't working

## 2015-12-11 ENCOUNTER — Encounter: Payer: Self-pay | Admitting: Gastroenterology

## 2015-12-19 ENCOUNTER — Encounter (INDEPENDENT_AMBULATORY_CARE_PROVIDER_SITE_OTHER): Payer: Self-pay

## 2015-12-19 ENCOUNTER — Encounter: Payer: Self-pay | Admitting: Gastroenterology

## 2015-12-19 ENCOUNTER — Ambulatory Visit (INDEPENDENT_AMBULATORY_CARE_PROVIDER_SITE_OTHER): Payer: BLUE CROSS/BLUE SHIELD | Admitting: Gastroenterology

## 2015-12-19 ENCOUNTER — Telehealth: Payer: Self-pay | Admitting: Medical

## 2015-12-19 VITALS — BP 108/66 | HR 84 | Ht 66.0 in | Wt 168.0 lb

## 2015-12-19 DIAGNOSIS — K5900 Constipation, unspecified: Secondary | ICD-10-CM | POA: Diagnosis not present

## 2015-12-19 DIAGNOSIS — K5909 Other constipation: Secondary | ICD-10-CM

## 2015-12-19 DIAGNOSIS — Z800 Family history of malignant neoplasm of digestive organs: Secondary | ICD-10-CM | POA: Diagnosis not present

## 2015-12-19 MED ORDER — LINACLOTIDE 145 MCG PO CAPS: 145.0000 ug | ORAL_CAPSULE | Freq: Every day | ORAL | 5 refills | Status: DC

## 2015-12-19 NOTE — Telephone Encounter (Signed)
LM with mother for her to have pt to call me back

## 2015-12-19 NOTE — Telephone Encounter (Signed)
Pt called and states that she is still having abdominal pain and was wondering if you would send her some more Percocet in for pain, pt uses [facility] [address], [place]

## 2015-12-19 NOTE — Patient Instructions (Signed)
Dr Silverio Decamp recommends that you complete a bowel purge (to clean out your bowels). Please do the following: Follow instructions on the Moviprep sample box givin today. You should expect results within 1 to 6 hours after completing the bowel purge.  Start Linzess 145 mcg daily. A prescription has been sent to your pharmacy.   It has been recommended to you by your physician that you have a(n) Colonoscopy completed. Per your request, we did not schedule the procedure(s) today. Please contact our office at [phone] should you decide to have the procedure completed.

## 2015-12-19 NOTE — Progress Notes (Signed)
[name]    [id number]    1965/06/15  Primary Care Physician:TYSINGER, Redge Gainer, PA-C  Referring Physician: Carlena Hurl, PA-C [address] [place], [place] [address]  Chief complaint: Constipation  HPI: 50 year old female with history of substance abuse ( cocaine, marijuana and benzodiazepines) here for evaluation of worsening constipation. Patient is currently having bowel movement about once a week and does not feel that she is able to evacuate completely. She was seen by her primary care physician 2 weeks ago, for left-sided abdominal pain, when CT abdomen and pelvis which showed significant amount of retained stool throughout the colon but otherwise no other acute abnormality. She was given a prescription for Percocet, patient reports that she received 10 pills and she took them all at a time and has none left. She denies taking any other opiates or recreational drugs. She is requesting refill for Percocet. Patient has a family history of colon cancer, she thinks her mother died of colon cancer in her 14s but she is not completely sure about the age.  Denies any blood per rectum, weight loss, or vomiting. She has significant  pain throughout the abdomen and has intermittent nausea.   Outpatient Encounter Prescriptions as of 12/19/2015  Medication Sig  . albuterol (PROAIR HFA) 108 (90 Base) MCG/ACT inhaler Inhale 2 puffs into the lungs every 6 (six) hours as needed for wheezing or shortness of breath.  . baclofen (LIORESAL) 10 MG tablet Take 10 mg by mouth daily as needed (headache).   . budesonide-formoterol (SYMBICORT) 160-4.5 MCG/ACT inhaler Inhale 2 puffs into the lungs 2 (two) times daily.  . Cholecalciferol (VITAMIN D) 2000 UNITS tablet Take 1 tablet (2,000 Units total) by mouth daily.  Marland Kitchen estradiol (ESTRACE) 2 MG tablet Take 2 mg by mouth daily. Reported on 07/26/2015  . fluticasone (CUTIVATE) 0.05 % cream Apply topically 2 (two) times daily.  Marland Kitchen  oxyCODONE-acetaminophen (PERCOCET) 5-325 MG tablet Take 1 tablet by mouth every 4 (four) hours as needed for severe pain.  . polyethylene glycol powder (GLYCOLAX/MIRALAX) powder Take 255 g by mouth daily.  . progesterone (PROMETRIUM) 100 MG capsule Reported on 12/04/2015  . Vitamin D, Ergocalciferol, (DRISDOL) 50000 units CAPS capsule Take 1 capsule (50,000 Units total) by mouth every 7 (seven) days.  Marland Kitchen zonisamide (ZONEGRAN) 100 MG capsule Take 200 mg by mouth daily.  Marland Kitchen linaclotide (LINZESS) 145 MCG CAPS capsule Take 1 capsule (145 mcg total) by mouth daily before breakfast.   No facility-administered encounter medications on file as of 12/19/2015.     Allergies as of 12/19/2015  . (No Known Allergies)    Past Medical History:  Diagnosis Date  . Adnexal mass 2006   Bilateral ovarian cystic masses- recomended GYN FU.   Marland Kitchen Alcohol abuse    Hx of, quit in 2009  . Ankle fracture    Bimalleolar sp closed reduction under floroscopy.   . Bronchial asthma   . Depression    Follows with [facility], history of voluntary admission to [facility].  History of suisidal ideation with drug od (50 pills of ibuprofen).   . History of cocaine abuse    Quit in 2009  . Marijuana abuse    Hx of, quit in 2009  . Menorrhagia 2006   Endometiral Biopsy- DEGENERATING SECRETORY-TYPE ENDOMETRIUM  . Migraine headache   . Normocytic anemia   . Tobacco abuse   . Transaminitis    Considered to be secondary to alchol use.  Past Surgical History:  Procedure Laterality Date  . [place] CYST EXCISION  2015  . BILATERAL SALPINGECTOMY Bilateral 07/18/2014   Procedure: BILATERAL SALPINGECTOMY;  Surgeon: Luz Lex, MD;  Location: [facility];  Service: Gynecology;  Laterality: Bilateral;  . Close reduction of bimalleolar ankle fracture     left  . LAPAROSCOPIC APPENDECTOMY  07/10/2011   Procedure: APPENDECTOMY LAPAROSCOPIC;  Surgeon: Rolm Bookbinder, MD;  Location: [facility];  Service: General;  Laterality: N/A;  .  LAPAROSCOPY N/A 07/18/2014   Procedure: LAPAROSCOPY OPERATIVE WITH ENDOCATCH;  Surgeon: Luz Lex, MD;  Location: [facility];  Service: Gynecology;  Laterality: N/A;  . LYSIS OF ADHESION N/A 07/18/2014   Procedure: LYSIS OF ADHESION;  Surgeon: Luz Lex, MD;  Location: [facility];  Service: Gynecology;  Laterality: N/A;    Family History  Problem Relation Age of Onset  . Diabetes Mother   . Hypertension Mother   . Colon cancer Mother 28  . Hypertension Father   . Stroke Father   . Diabetes Brother   . Obesity Brother   . Hypertension Brother   . Heart disease Neg Hx     Social History   Social History  . Marital status: Single    Spouse name: N/A  . Number of children: N/A  . Years of education: N/A   Occupational History  . Not on file.   Social History Main Topics  . Smoking status: Current Every Day Smoker    Packs/day: 0.50    Years: 10.00    Types: Cigarettes  . Smokeless tobacco: Not on file  . Alcohol use No     Comment: .   Marland Kitchen Drug use:     Types: Marijuana     Comment: Quit Coccaine and marijuana in 2009  . Sexual activity: Not on file   Other Topics Concern  . Not on file   Social History Narrative   Lives in [place] with her mom, single, never married, no kids.  Works at [employer] with [employer] and [employer] x 6 years.  Sleeps a lot, works 3 rd shift .  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      Review of systems: Review of Systems  Constitutional: Negative for fever and chills.  HENT: Negative.   Eyes: Negative for blurred vision.  Respiratory: Negative for cough, shortness of breath and wheezing.   Cardiovascular: Negative for chest pain and palpitations.  Gastrointestinal: as per HPI Genitourinary: Negative for dysuria, urgency, frequency and hematuria.  Musculoskeletal: Positive for myalgias, back pain and joint pain.  Skin: Negative for itching and rash.  Neurological: Negative for dizziness, tremors, focal weakness, seizures and  loss of consciousness.  Psychiatric/Behavioral: Negative for depression, suicidal ideas and hallucinations.  All other systems reviewed and are negative.   Physical Exam: Vitals:   12/19/15 1008  BP: 108/66  Pulse: 84   Gen:      No acute distress HEENT:  EOMI, sclera anicteric Neck:     No masses; no thyromegaly Lungs:    Clear to auscultation bilaterally; normal respiratory effort CV:         Regular rate and rhythm; no murmurs Abd:      + bowel sounds; soft, non-tender; no palpable masses, no distension, very benign exam but patient complains of pain when asked but no tenderness when distracted Ext:    No edema; adequate peripheral perfusion Skin:      Warm and dry; no  rash Neuro: alert and oriented x 3 Psych: normal mood and affect  Data Reviewed:  Reviewed chart in epic and discussed results with patient   Assessment and Plan/Recommendations:  50 year old female with history of substance abuse and family history of colon cancer here with complaints of worsening constipation Discussed in detail with patient that it is not recommended for patient to take Percocet for pain control especially if it's secondary to chronic constipation, given Percocet will make constipation worse. No other significant pathology or acute abnormality was identified on CT abdomen and pelvis.  Advised her to do bowel purge today to decrease the stool burden and relieve her symptoms Start Linzess 145 g daily tomorrow AM Increase dietary fiber and fluid intake We will schedule for colonoscopy given family history of colon cancer in her mother in her 4s Return as needed   Greater than 50% of the time used for counseling as well as treatment plan and follow-up. She had multiple questions which were answered to her satisfaction     K. Denzil Magnuson , MD [phone] Mon-Fri 8a-5p [phone] after 5p, weekends, holidays  CC: Tysinger, Camelia Eng, PA-C

## 2015-12-19 NOTE — Telephone Encounter (Signed)
No, this was short term and CT showed nothing but constipation.  She saw GI already I think, so hopefully they gave her some recommendations.

## 2015-12-19 NOTE — Telephone Encounter (Signed)
Forwarding to you :)

## 2015-12-19 NOTE — Telephone Encounter (Signed)
Pt seen GI today and stated that they gave her a medication but she has not started it yet and would like something for pain now and I told her that we would not be able to refill that medication that she needs to follow the GI recommendations

## 2016-02-19 ENCOUNTER — Ambulatory Visit: Payer: BLUE CROSS/BLUE SHIELD | Admitting: Gastroenterology

## 2016-03-18 ENCOUNTER — Ambulatory Visit (INDEPENDENT_AMBULATORY_CARE_PROVIDER_SITE_OTHER): Payer: BLUE CROSS/BLUE SHIELD | Admitting: Medical

## 2016-03-18 ENCOUNTER — Other Ambulatory Visit: Payer: Self-pay | Admitting: Medical

## 2016-03-18 ENCOUNTER — Encounter: Payer: Self-pay | Admitting: Medical

## 2016-03-18 VITALS — BP 120/70 | HR 76 | Temp 98.1°F | Resp 16 | Wt 163.6 lb

## 2016-03-18 DIAGNOSIS — R112 Nausea with vomiting, unspecified: Secondary | ICD-10-CM

## 2016-03-18 DIAGNOSIS — R197 Diarrhea, unspecified: Secondary | ICD-10-CM

## 2016-03-18 DIAGNOSIS — M549 Dorsalgia, unspecified: Secondary | ICD-10-CM

## 2016-03-18 DIAGNOSIS — R109 Unspecified abdominal pain: Secondary | ICD-10-CM | POA: Diagnosis not present

## 2016-03-18 LAB — POCT URINALYSIS DIPSTICK
BILIRUBIN UA: NEGATIVE
Glucose, UA: NEGATIVE
Ketones, UA: NEGATIVE
NITRITE UA: NEGATIVE
Protein, UA: NEGATIVE
RBC UA: NEGATIVE
Spec Grav, UA: >=1.03
UROBILINOGEN UA: NEGATIVE
pH, UA: 6

## 2016-03-18 MED ORDER — PROMETHAZINE HCL 25 MG PO TABS
25.0000 mg | ORAL_TABLET | Freq: Three times a day (TID) | ORAL | 0 refills | Status: DC | PRN
Start: 2016-03-18 — End: 2018-06-11

## 2016-03-18 MED ORDER — DICLOFENAC SODIUM 75 MG PO TBEC: 75.0000 mg | DELAYED_RELEASE_TABLET | Freq: Two times a day (BID) | ORAL | 0 refills | Status: DC

## 2016-03-18 NOTE — Progress Notes (Signed)
Subjective: Chief Complaint  Patient presents with  . Emesis    onset Sunday. reports ABD pain. pt states she needs "percocet for her pain." Pt has not vomited since Sunday.   . Diarrhea   Here for vomiting, abdomina pain, diarrhea. She notes 3 day hx/o left sided abdominal pain, back pain on left, diarrhea all day long yesterday and Sunday.   Sunday vomiting several times, not so much yesterday.  No fever.   No blood in stool.  No burning with urination, no urinary frequently, no odor or blood in urine.   Couldn't go to work yesterday.  No sick contacts.    No recent travel.   No suspected food poisoning.  No URI symptoms.   Using nothing for symptoms, was hoping this would resolve.  She is requesting pain medication for back pain.  No other aggravating or relieving factors. No other complaint.   Past Medical History:  Diagnosis Date  . Adnexal mass 2006   Bilateral ovarian cystic masses- recomended GYN FU.   Marland Kitchen Alcohol abuse    Hx of, quit in 2009  . Ankle fracture    Bimalleolar sp closed reduction under floroscopy.   . Bronchial asthma   . Depression    Follows with [facility], history of voluntary admission to [facility]  History of suisidal ideation with drug od (50 pills of ibuprofen).   . History of cocaine abuse    Quit in 2009  . Marijuana abuse    Hx of, quit in 2009  . Menorrhagia 2006   Endometiral Biopsy- DEGENERATING SECRETORY-TYPE ENDOMETRIUM  . Migraine headache   . Normocytic anemia   . Tobacco abuse   . Transaminitis    Considered to be secondary to alchol use.    Past Surgical History:  Procedure Laterality Date  . [place] CYST EXCISION  2015  . BILATERAL SALPINGECTOMY Bilateral 07/18/2014   Procedure: BILATERAL SALPINGECTOMY;  Surgeon: Luz Lex, MD;  Location: [facility];  Service: Gynecology;  Laterality: Bilateral;  . Close reduction of bimalleolar ankle fracture     left  . LAPAROSCOPIC APPENDECTOMY  07/10/2011   Procedure: APPENDECTOMY LAPAROSCOPIC;  Surgeon:  Rolm Bookbinder, MD;  Location: [facility];  Service: General;  Laterality: N/A;  . LAPAROSCOPY N/A 07/18/2014   Procedure: LAPAROSCOPY OPERATIVE WITH ENDOCATCH;  Surgeon: Luz Lex, MD;  Location: [facility];  Service: Gynecology;  Laterality: N/A;  . LYSIS OF ADHESION N/A 07/18/2014   Procedure: LYSIS OF ADHESION;  Surgeon: Luz Lex, MD;  Location: [facility];  Service: Gynecology;  Laterality: N/A;   Current Outpatient Prescriptions on File Prior to Visit  Medication Sig Dispense Refill  . albuterol (PROAIR HFA) 108 (90 Base) MCG/ACT inhaler Inhale 2 puffs into the lungs every 6 (six) hours as needed for wheezing or shortness of breath. 18 g 2  . baclofen (LIORESAL) 10 MG tablet Take 10 mg by mouth daily as needed (headache).   0  . budesonide-formoterol (SYMBICORT) 160-4.5 MCG/ACT inhaler Inhale 2 puffs into the lungs 2 (two) times daily. 1 Inhaler 12  . fluticasone (CUTIVATE) 0.05 % cream Apply topically 2 (two) times daily. 30 g 1  . polyethylene glycol powder (GLYCOLAX/MIRALAX) powder Take 255 g by mouth daily. 255 g 0  . Vitamin D, Ergocalciferol, (DRISDOL) 50000 units CAPS capsule Take 1 capsule (50,000 Units total) by mouth every 7 (seven) days. 15 capsule 1  . zonisamide (ZONEGRAN) 100 MG capsule Take 200 mg by mouth daily.  2  .  Cholecalciferol (VITAMIN D) 2000 UNITS tablet Take 1 tablet (2,000 Units total) by mouth daily. (Patient not taking: Reported on 03/18/2016) 90 tablet 3  . estradiol (ESTRACE) 2 MG tablet Take 2 mg by mouth daily. Reported on 07/26/2015  6  . linaclotide (LINZESS) 145 MCG CAPS capsule Take 1 capsule (145 mcg total) by mouth daily before breakfast. (Patient not taking: Reported on 03/18/2016) 30 capsule 5  . oxyCODONE-acetaminophen (PERCOCET) 5-325 MG tablet Take 1 tablet by mouth every 4 (four) hours as needed for severe pain. (Patient not taking: Reported on 03/18/2016) 10 tablet 0  . progesterone (PROMETRIUM) 100 MG capsule Reported on 12/04/2015  11   No current  facility-administered medications on file prior to visit.    ROS as in subjective  Objective: BP 120/70   Pulse 76   Temp 98.1 F (36.7 C) (Oral)   Resp 16   Wt 163 lb 9.6 oz (74.2 kg)   LMP 12/24/2012   BMI 26.41 kg/m   Wt Readings from Last 3 Encounters:  03/18/16 163 lb 9.6 oz (74.2 kg)  12/19/15 168 lb (76.2 kg)  12/04/15 168 lb (76.2 kg)   General appearance: alert, WD/WN, seems somewhat in pain HEENT: normocephalic, sclerae anicteric, TMs pearly, nares patent, no discharge or erythema, pharynx normal Oral cavity: MMM, no lesions Neck: supple, no lymphadenopathy, no thyromegaly, no masses Heart: RRR, normal S1, S2, no murmurs Lungs: CTA bilaterally, no wheezes, rhonchi, or rales Abdomen: +bs, soft, mild generalized tenderness, non distended, no masses, no hepatomegaly, no splenomegaly Back: mild generalized lower back tenderness, and she seems to c/o pain just sitting but also with lying Pulses: 2+ symmetric, upper and lower extremities, normal cap refill Ext: no edema   Assessment: Encounter Diagnoses  Name Primary?  . Abdominal pain, unspecified abdominal location Yes  . Nausea and vomiting, intractability of vomiting not specified, unspecified vomiting type   . Diarrhea, unspecified type   . Back pain, unspecified back location, unspecified back pain laterality, unspecified chronicity      Plan: symptoms suggest viral gastroenteritis.   Of note, she came in last 11/2015 for several abdominal pain and back pain, and labs and CT ultimately didn't show anything of concern.   Thus, given her presentation, request for percocet, there is suspicion for malingering.   UA normal.  Will send urine for drug screen.  She does have hx/o substance abuse.  Medications as below.  Checked [place] controlled substance reporting and no abberancies there.   advised she return soon for physical .  Advised if not much improvement or worse in the next 2-3 days, then recheck.  Gave note for  work.  discussed precautions, prevention of spread of virus, hand washing.  Dura was seen today for emesis and diarrhea.  Diagnoses and all orders for this visit:  Abdominal pain, unspecified abdominal location -     POCT urinalysis dipstick -     Cancel: Drug Screen, Urine -     Drug Screen, Urine  Nausea and vomiting, intractability of vomiting not specified, unspecified vomiting type  Diarrhea, unspecified type  Back pain, unspecified back location, unspecified back pain laterality, unspecified chronicity -     POCT urinalysis dipstick -     Cancel: Drug Screen, Urine -     Drug Screen, Urine  Other orders -     promethazine (PHENERGAN) 25 MG tablet; Take 1 tablet (25 mg total) by mouth every 8 (eight) hours as needed for nausea or vomiting. -  diclofenac (VOLTAREN) 75 MG EC tablet; Take 1 tablet (75 mg total) by mouth 2 (two) times daily.

## 2016-03-18 NOTE — Patient Instructions (Signed)
Encounter Diagnoses  Name Primary?  . Abdominal pain, unspecified abdominal location Yes  . Nausea and vomiting, intractability of vomiting not specified, unspecified vomiting type   . Diarrhea, unspecified type   . Back pain, unspecified back location, unspecified back pain laterality, unspecified chronicity    recommendations symptoms suggest gastroenteritis The urine didn't show anything worrisome today I also reviewed prior CT scan and labs from last 11/2015 when you had similar symptoms I recommend plenty of clear fluids such as water, gingerale, Gatorade If nauseated, wait 30 minutes before trying to eat For the next few days, eat a BRAT diet - bananas, rice, applesauce, toast, other bland things like soup, jello You can use Phenergan/promethazine 1 tablet every 6 hours as needed for nausea.  - caution as this will make you drowsy You can use the Diclofenac for pain If not seeing improvement in the next 48 hours or so, recheck otherwise I expect resolution of symptoms by the end of the week Recheck soon for fasting labs and physical

## 2016-03-19 LAB — PAIN MGMT, PROFILE 1 W/O CONF, U
Amphetamines: NEGATIVE ng/mL (ref <500)
BENZODIAZEPINES: NEGATIVE ng/mL (ref <100)
Barbiturates: NEGATIVE ng/mL (ref <300)
Cocaine Metabolite: POSITIVE ng/mL — AB (ref <150)
Creatinine: 227.7 mg/dL (ref >=20.0)
METHADONE METABOLITE: NEGATIVE ng/mL (ref <100)
Marijuana Metabolite: POSITIVE ng/mL — AB (ref <20)
OPIATES: NEGATIVE ng/mL (ref <100)
Oxidant: NEGATIVE ug/mL (ref <200)
Oxycodone: NEGATIVE ng/mL (ref <100)
PHENCYCLIDINE: NEGATIVE ng/mL (ref <25)
pH: 6.73 (ref 4.5–9.0)

## 2016-03-25 ENCOUNTER — Other Ambulatory Visit: Payer: Self-pay | Admitting: Medical

## 2016-03-25 MED ORDER — OMEPRAZOLE 40 MG PO CPDR: 40.0000 mg | DELAYED_RELEASE_CAPSULE | Freq: Every day | ORAL | 2 refills | Status: DC

## 2016-03-26 ENCOUNTER — Other Ambulatory Visit: Payer: Self-pay | Admitting: Medical

## 2016-03-26 NOTE — Telephone Encounter (Signed)
Is this okay to refill? 

## 2016-04-22 ENCOUNTER — Other Ambulatory Visit: Payer: Self-pay | Admitting: Medical

## 2016-04-22 NOTE — Telephone Encounter (Signed)
Can this patient have a refill for this ?

## 2016-05-07 ENCOUNTER — Encounter: Payer: Self-pay | Admitting: Medical

## 2016-05-07 ENCOUNTER — Ambulatory Visit (INDEPENDENT_AMBULATORY_CARE_PROVIDER_SITE_OTHER): Payer: BLUE CROSS/BLUE SHIELD | Admitting: Medical

## 2016-05-07 VITALS — BP 144/90 | HR 73 | Temp 97.9°F | Wt 163.0 lb

## 2016-05-07 DIAGNOSIS — Z113 Encounter for screening for infections with a predominantly sexual mode of transmission: Secondary | ICD-10-CM | POA: Diagnosis not present

## 2016-05-07 DIAGNOSIS — R3915 Urgency of urination: Secondary | ICD-10-CM | POA: Diagnosis not present

## 2016-05-07 DIAGNOSIS — F1911 Other psychoactive substance abuse, in remission: Secondary | ICD-10-CM

## 2016-05-07 DIAGNOSIS — Z87898 Personal history of other specified conditions: Secondary | ICD-10-CM | POA: Diagnosis not present

## 2016-05-07 DIAGNOSIS — R109 Unspecified abdominal pain: Secondary | ICD-10-CM | POA: Diagnosis not present

## 2016-05-07 DIAGNOSIS — R300 Dysuria: Secondary | ICD-10-CM

## 2016-05-07 DIAGNOSIS — R350 Frequency of micturition: Secondary | ICD-10-CM

## 2016-05-07 LAB — POCT URINALYSIS DIPSTICK
BILIRUBIN UA: NEGATIVE
Glucose, UA: NEGATIVE
Ketones, UA: NEGATIVE
NITRITE UA: NEGATIVE
RBC UA: NEGATIVE
Spec Grav, UA: 1.015
Urobilinogen, UA: NEGATIVE
pH, UA: 7

## 2016-05-07 MED ORDER — DICLOFENAC SODIUM 75 MG PO TBEC: 75.0000 mg | DELAYED_RELEASE_TABLET | Freq: Two times a day (BID) | ORAL | 0 refills | Status: DC

## 2016-05-07 MED ORDER — CIPROFLOXACIN HCL 500 MG PO TABS: 500.0000 mg | ORAL_TABLET | Freq: Two times a day (BID) | ORAL | 0 refills | Status: DC

## 2016-05-07 NOTE — Addendum Note (Signed)
Addended by: Carlena Hurl on: 05/07/2016 03:28 PM   Modules accepted: Orders

## 2016-05-07 NOTE — Progress Notes (Addendum)
Subjective: Chief Complaint  Patient presents with  . possible  uti    possible uti    Here for possible UTI or pelvic infection.   She reports 2 weeks of feeling a little irritated in vaginal area, but worse symptoms began 3 days ago.  Burns with urination, has urgency, has urinary frequency.  Denies blood in urine, no odor in urine, no cloudy urine.   No vaginal discharge.  Has pain in lower abdomen.  Has some back pain ongoing.   Been dating a new girl for 3 months, they performed oral sex on each other, used her sex toy.   Been drinking a lot of soda lately too.  Been smoking some weed for migraines.  No other aggravating or relieving factors. No other complaint.  Past Medical History:  Diagnosis Date  . Adnexal mass 2006   Bilateral ovarian cystic masses- recomended GYN FU.   Marland Kitchen Alcohol abuse    Hx of, quit in 2009  . Ankle fracture    Bimalleolar sp closed reduction under floroscopy.   . Bronchial asthma   . Depression    Follows with [facility], history of voluntary admission to [facility].  History of suisidal ideation with drug od (50 pills of ibuprofen).   . History of cocaine abuse    Quit in 2009  . Marijuana abuse    Hx of, quit in 2009  . Menorrhagia 2006   Endometiral Biopsy- DEGENERATING SECRETORY-TYPE ENDOMETRIUM  . Migraine headache   . Normocytic anemia   . Tobacco abuse   . Transaminitis    Considered to be secondary to alchol use.    Current Outpatient Prescriptions on File Prior to Visit  Medication Sig Dispense Refill  . albuterol (PROAIR HFA) 108 (90 Base) MCG/ACT inhaler Inhale 2 puffs into the lungs every 6 (six) hours as needed for wheezing or shortness of breath. 18 g 2  . budesonide-formoterol (SYMBICORT) 160-4.5 MCG/ACT inhaler Inhale 2 puffs into the lungs 2 (two) times daily. 1 Inhaler 12  . Cholecalciferol (VITAMIN D) 2000 UNITS tablet Take 1 tablet (2,000 Units total) by mouth daily. 90 tablet 3  . estradiol (ESTRACE) 2 MG tablet Take 2 mg by mouth daily.  Reported on 07/26/2015  6  . fluticasone (CUTIVATE) 0.05 % cream Apply topically 2 (two) times daily. 30 g 1  . linaclotide (LINZESS) 145 MCG CAPS capsule Take 1 capsule (145 mcg total) by mouth daily before breakfast. 30 capsule 5  . Vitamin D, Ergocalciferol, (DRISDOL) 50000 units CAPS capsule TAKE 1 CAPSULE (50,000 UNITS TOTAL) BY MOUTH EVERY 7 (SEVEN) DAYS. 4 capsule 0  . baclofen (LIORESAL) 10 MG tablet Take 10 mg by mouth daily as needed (headache).   0  . omeprazole (PRILOSEC) 40 MG capsule Take 1 capsule (40 mg total) by mouth daily. (Patient not taking: Reported on 05/07/2016) 30 capsule 2  . oxyCODONE-acetaminophen (PERCOCET) 5-325 MG tablet Take 1 tablet by mouth every 4 (four) hours as needed for severe pain. (Patient not taking: Reported on 05/07/2016) 10 tablet 0  . polyethylene glycol powder (GLYCOLAX/MIRALAX) powder Take 255 g by mouth daily. (Patient not taking: Reported on 05/07/2016) 255 g 0  . progesterone (PROMETRIUM) 100 MG capsule Reported on 12/04/2015  11  . promethazine (PHENERGAN) 25 MG tablet Take 1 tablet (25 mg total) by mouth every 8 (eight) hours as needed for nausea or vomiting. (Patient not taking: Reported on 05/07/2016) 20 tablet 0  . zonisamide (ZONEGRAN) 100 MG capsule Take 200 mg by  mouth daily.  2   No current facility-administered medications on file prior to visit.    ROS as in subjective   Objective: BP (!) 144/90   Pulse 73   Temp 97.9 F (36.6 C)   Wt 163 lb (73.9 kg)   LMP 12/24/2012   SpO2 99%   BMI 26.31 kg/m   BP Readings from Last 3 Encounters:  05/07/16 (!) 144/90  03/18/16 120/70  12/19/15 108/66   Gen: wd, wn, nad Abdominal: +bs, soft, mild LLQ tenderness, otherwise nontender, no mass, no organomegaly Back: mild generalized lower back tendonesis GU deferred Psych: pleasant but affect a little anxious and flighty   Assessment: Encounter Diagnoses  Name Primary?  . Burning with urination Yes  . Urinary frequency   . Urinary  urgency   . Screen for STD (sexually transmitted disease)   . Abdominal pain, unspecified abdominal location   . History of substance abuse      Plan Labs today. Begin Cipro for possible UTI.   Screen for STD.  Advised safe sex.   Can use the diclofenac for back pain.    Counseled on substance abuse.  She reports just using marijuana currently but denies current cocaine use.  She does attend NA meetings from time to time.   Recommend counseling, abstinence from any recreation substances including marijuana.    Elena was seen today for possible  uti.  Diagnoses and all orders for this visit:  Burning with urination -     Urinalysis Dipstick -     ciprofloxacin (CIPRO) 500 MG tablet; Take 1 tablet (500 mg total) by mouth 2 (two) times daily. -     Urine culture -     HSV 2 antibody, IgG -     HSV 1 antibody, IgG -     HSV(herpes simplex vrs) 1+2 ab-IgM  Urinary frequency -     GC/Chlamydia Probe Amp -     HIV antibody -     RPR -     ciprofloxacin (CIPRO) 500 MG tablet; Take 1 tablet (500 mg total) by mouth 2 (two) times daily. -     Urine culture  Urinary urgency -     ciprofloxacin (CIPRO) 500 MG tablet; Take 1 tablet (500 mg total) by mouth 2 (two) times daily. -     Urine culture  Screen for STD (sexually transmitted disease) -     GC/Chlamydia Probe Amp -     HIV antibody -     RPR -     HSV 2 antibody, IgG -     HSV 1 antibody, IgG -     HSV(herpes simplex vrs) 1+2 ab-IgM -     Hepatitis panel, acute  Abdominal pain, unspecified abdominal location -     HSV 2 antibody, IgG -     HSV 1 antibody, IgG -     HSV(herpes simplex vrs) 1+2 ab-IgM -     Hepatitis panel, acute  History of substance abuse  Other orders -     diclofenac (VOLTAREN) 75 MG EC tablet; Take 1 tablet (75 mg total) by mouth 2 (two) times daily.

## 2016-05-08 LAB — HSV 1 ANTIBODY, IGG: HSV 1 GLYCOPROTEIN G AB, IGG: 26.3 {index} — AB (ref <0.90)

## 2016-05-08 LAB — HEPATITIS PANEL, ACUTE
HCV AB: NEGATIVE
HEP B S AG: NEGATIVE
Hep A IgM: NONREACTIVE
Hep B C IgM: NONREACTIVE

## 2016-05-08 LAB — URINE CULTURE: ORGANISM ID, BACTERIA: NO GROWTH

## 2016-05-08 LAB — HSV 2 ANTIBODY, IGG: HSV 2 Glycoprotein G Ab, IgG: >23 Index — ABNORMAL HIGH (ref <0.90)

## 2016-05-08 LAB — GC/CHLAMYDIA PROBE AMP
CT PROBE, AMP APTIMA: NOT DETECTED
GC Probe RNA: NOT DETECTED

## 2016-05-08 LAB — RPR

## 2016-05-08 LAB — HIV ANTIBODY (ROUTINE TESTING W REFLEX): HIV: NONREACTIVE

## 2016-05-10 LAB — HSV 1/2 AB (IGM), IFA W/RFLX TITER
HSV 1 IgM Screen: NEGATIVE
HSV 2 IGM SCREEN: NEGATIVE

## 2016-05-16 ENCOUNTER — Other Ambulatory Visit: Payer: Self-pay | Admitting: Medical

## 2016-05-16 NOTE — Telephone Encounter (Signed)
Is this okay to refill? 

## 2016-05-31 ENCOUNTER — Ambulatory Visit (INDEPENDENT_AMBULATORY_CARE_PROVIDER_SITE_OTHER): Payer: BLUE CROSS/BLUE SHIELD

## 2016-05-31 ENCOUNTER — Encounter (HOSPITAL_COMMUNITY): Payer: Self-pay | Admitting: *Deleted

## 2016-05-31 ENCOUNTER — Ambulatory Visit (HOSPITAL_COMMUNITY)
Admission: EM | Admit: 2016-05-31 | Discharge: 2016-05-31 | Disposition: A | Discharge status: Home / Self Care | Payer: BLUE CROSS/BLUE SHIELD | Attending: Family Medicine | Admitting: Family Medicine

## 2016-05-31 DIAGNOSIS — S93492A Sprain of other ligament of left ankle, initial encounter: Secondary | ICD-10-CM

## 2016-05-31 MED ORDER — IBUPROFEN 800 MG PO TABS: 800.0000 mg | ORAL_TABLET | Freq: Three times a day (TID) | ORAL | 0 refills | Status: DC

## 2016-05-31 MED ORDER — DICLOFENAC SODIUM 75 MG PO TBEC: 75.0000 mg | DELAYED_RELEASE_TABLET | Freq: Two times a day (BID) | ORAL | 1 refills | Status: DC

## 2016-05-31 NOTE — ED Provider Notes (Signed)
CSN: [id number]     Arrival date & time 05/31/16  1941 History   None    Chief Complaint  Patient presents with  . Foot Injury   (Consider location/radiation/quality/duration/timing/severity/associated sxs/prior Treatment) Patient presents with left ankle pain following an injury. She reports twisting her ankle from a fall down the stairs. She describes pain and swelling laterally.       Past Medical History:  Diagnosis Date  . Adnexal mass 2006   Bilateral ovarian cystic masses- recomended GYN FU.   Marland Kitchen Alcohol abuse    Hx of, quit in 2009  . Ankle fracture    Bimalleolar sp closed reduction under floroscopy.   . Bronchial asthma   . Depression    Follows with [facility] history of voluntary admission to [facility].  History of suisidal ideation with drug od (50 pills of ibuprofen).   . History of cocaine abuse    Quit in 2009  . Marijuana abuse    Hx of, quit in 2009  . Menorrhagia 2006   Endometiral Biopsy- DEGENERATING SECRETORY-TYPE ENDOMETRIUM  . Migraine headache   . Normocytic anemia   . Tobacco abuse   . Transaminitis    Considered to be secondary to alchol use.    Past Surgical History:  Procedure Laterality Date  . [place] CYST EXCISION  2015  . BILATERAL SALPINGECTOMY Bilateral 07/18/2014   Procedure: BILATERAL SALPINGECTOMY;  Surgeon: Luz Lex, MD;  Location: [place] [facility];  Service: Gynecology;  Laterality: Bilateral;  . Close reduction of bimalleolar ankle fracture     left  . LAPAROSCOPIC APPENDECTOMY  07/10/2011   Procedure: APPENDECTOMY LAPAROSCOPIC;  Surgeon: Rolm Bookbinder, MD;  Location: [facility];  Service: General;  Laterality: N/A;  . LAPAROSCOPY N/A 07/18/2014   Procedure: LAPAROSCOPY OPERATIVE WITH ENDOCATCH;  Surgeon: Luz Lex, MD;  Location: [place] [facility];  Service: Gynecology;  Laterality: N/A;  . LYSIS OF ADHESION N/A 07/18/2014   Procedure: LYSIS OF ADHESION;  Surgeon: Luz Lex, MD;  Location: [place] [facility];  Service: Gynecology;  Laterality: N/A;   Family  History  Problem Relation Age of Onset  . Diabetes Mother   . Hypertension Mother   . Colon cancer Mother 26  . Hypertension Father   . Stroke Father   . Diabetes Brother   . Obesity Brother   . Hypertension Brother   . Heart disease Neg Hx    Social History  Substance Use Topics  . Smoking status: Current Every Day Smoker    Packs/day: 0.50    Years: 10.00    Types: Cigarettes  . Smokeless tobacco: Never Used  . Alcohol use No     Comment: .    OB History    No data available     Review of Systems  All other systems reviewed and are negative.   Allergies  Patient has no known allergies.  Home Medications   Prior to Admission medications   Medication Sig Start Date End Date Taking? Authorizing Provider  albuterol (PROAIR HFA) 108 (90 Base) MCG/ACT inhaler Inhale 2 puffs into the lungs every 6 (six) hours as needed for wheezing or shortness of breath. 07/26/15   Camelia Eng Tysinger, PA-C  baclofen (LIORESAL) 10 MG tablet Take 10 mg by mouth daily as needed (headache).  05/10/14   Historical Provider, MD  budesonide-formoterol (SYMBICORT) 160-4.5 MCG/ACT inhaler Inhale 2 puffs into the lungs 2 (two) times daily. 07/09/15   Camelia Eng Tysinger, PA-C  Cholecalciferol (VITAMIN D) 2000 UNITS tablet  Take 1 tablet (2,000 Units total) by mouth daily. 11/09/14   Camelia Eng Tysinger, PA-C  ciprofloxacin (CIPRO) 500 MG tablet Take 1 tablet (500 mg total) by mouth 2 (two) times daily. 05/07/16   Camelia Eng Tysinger, PA-C  diclofenac (VOLTAREN) 75 MG EC tablet Take 1 tablet (75 mg total) by mouth 2 (two) times daily. 05/31/16   Bjorn Pippin, PA-C  estradiol (ESTRACE) 2 MG tablet Take 2 mg by mouth daily. Reported on 07/26/2015 08/10/14   Historical Provider, MD  fluticasone (CUTIVATE) 0.05 % cream Apply topically 2 (two) times daily. 02/01/14   Billy Fischer, MD  linaclotide [facility]) 145 MCG CAPS capsule Take 1 capsule (145 mcg total) by mouth daily before breakfast. 12/19/15   Mauri Pole, MD   omeprazole (PRILOSEC) 40 MG capsule Take 1 capsule (40 mg total) by mouth daily. Patient not taking: Reported on 05/07/2016 03/25/16   Camelia Eng Tysinger, PA-C  oxyCODONE-acetaminophen (PERCOCET) 5-325 MG tablet Take 1 tablet by mouth every 4 (four) hours as needed for severe pain. Patient not taking: Reported on 05/07/2016 12/04/15   Camelia Eng Tysinger, PA-C  polyethylene glycol powder (GLYCOLAX/MIRALAX) powder Take 255 g by mouth daily. Patient not taking: Reported on 05/07/2016 12/04/15   Camelia Eng Tysinger, PA-C  progesterone (PROMETRIUM) 100 MG capsule Reported on 12/04/2015 10/10/14   Historical Provider, MD  promethazine (PHENERGAN) 25 MG tablet Take 1 tablet (25 mg total) by mouth every 8 (eight) hours as needed for nausea or vomiting. Patient not taking: Reported on 05/07/2016 03/18/16   Camelia Eng Tysinger, PA-C  Vitamin D, Ergocalciferol, (DRISDOL) 50000 units CAPS capsule TAKE 1 CAPSULE (50,000 UNITS TOTAL) BY MOUTH EVERY 7 (SEVEN) DAYS. 05/16/16   Camelia Eng Tysinger, PA-C  zonisamide (ZONEGRAN) 100 MG capsule Take 200 mg by mouth daily. 06/20/14   Historical Provider, MD   Meds Ordered and Administered this Visit  Medications - No data to display  LMP 12/24/2012  No data found.   Physical Exam  Constitutional: She appears well-developed and well-nourished.  Crying hysterically in the room  Musculoskeletal:  Swelling to lateral aspect of left ankle, poor effort with ROM. No deformities. Pulses intact distally.   Neurological: She is alert.  Skin: Skin is warm and dry.  Psychiatric:  Patient appears anxious  Nursing note and vitals reviewed.   Urgent Care Course   Clinical Course     Procedures (including critical care time)  Labs Review Labs Reviewed - No data to display  Imaging Review Dg Ankle Complete Left  Result Date: 05/31/2016 CLINICAL DATA:  Pain following fall EXAM: LEFT ANKLE COMPLETE - 3+ VIEW COMPARISON:  November 19, 2005 FINDINGS: Frontal, oblique, and lateral views  obtained. There is evidence of an old healed fracture of the distal fibular diaphysis with remodeling. There is no acute fracture or joint effusion. The ankle mortise appears intact. Joint spaces appear normal. IMPRESSION: Old healed fracture distal fibula. No acute fracture or apparent arthropathy. Ankle mortise appears intact. Electronically Signed   By: Lowella Grip III M.D.   On: 05/31/2016 20:58     Visual Acuity Review  Right Eye Distance:   Left Eye Distance:   Bilateral Distance:    Right Eye Near:   Left Eye Near:    Bilateral Near:         MDM   1. Sprain of anterior talofibular ligament of left ankle, initial encounter    X-rays reviewed. Treat for ankle sprain with ASO brace, WBAT, ice  and elevation. Work note for 2 days. If not improving within a few weeks then f/u with Orthopedics.     Bjorn Pippin, PA-C 05/31/16 2109

## 2016-05-31 NOTE — ED Triage Notes (Signed)
Pt  Reports   She  Was  Going  Up    Stairs   And  [place] and  Injured  Her l  Foot  She  Has  Pain  When  She  Bears  Weight  On the  Affected  Foot      She   denys  Any other  Injury  She  Is  Sitting upright on the  Exam table    Speaking  In  Complete   sentances

## 2016-05-31 NOTE — ED Triage Notes (Signed)
inj   l    Foot

## 2016-05-31 NOTE — Discharge Instructions (Signed)
Wear your brace to ambulate. No fractures. May use the medication every 12 hours for pain. Ice and ambulate over the next few days.

## 2016-07-15 DIAGNOSIS — Z6824 Body mass index (BMI) 24.0-24.9, adult: Secondary | ICD-10-CM | POA: Diagnosis not present

## 2016-07-15 DIAGNOSIS — Z1231 Encounter for screening mammogram for malignant neoplasm of breast: Secondary | ICD-10-CM | POA: Diagnosis not present

## 2016-07-15 DIAGNOSIS — Z01419 Encounter for gynecological examination (general) (routine) without abnormal findings: Secondary | ICD-10-CM | POA: Diagnosis not present

## 2016-07-22 ENCOUNTER — Telehealth: Payer: Self-pay | Admitting: Medical

## 2016-07-22 NOTE — Telephone Encounter (Signed)
Pt called and left message on voicemail stating she needed a refill on a RX I tried to call her back to see exactly what she needed but no answer

## 2016-07-23 ENCOUNTER — Telehealth: Payer: Self-pay | Admitting: Medical

## 2016-07-23 ENCOUNTER — Other Ambulatory Visit: Payer: Self-pay | Admitting: Medical

## 2016-07-23 MED ORDER — DICLOFENAC SODIUM 75 MG PO TBEC: 75.0000 mg | DELAYED_RELEASE_TABLET | Freq: Two times a day (BID) | ORAL | 0 refills | Status: DC

## 2016-07-23 NOTE — Telephone Encounter (Signed)
Pt requesting refill on Voltaren 75 mg

## 2016-08-18 DIAGNOSIS — G43719 Chronic migraine without aura, intractable, without status migrainosus: Secondary | ICD-10-CM | POA: Diagnosis not present

## 2016-08-18 DIAGNOSIS — G43019 Migraine without aura, intractable, without status migrainosus: Secondary | ICD-10-CM | POA: Diagnosis not present

## 2016-08-19 ENCOUNTER — Telehealth: Payer: Self-pay | Admitting: Medical

## 2016-08-19 NOTE — Telephone Encounter (Signed)
Pt called to fu on Diclofenac refill. She said she would like to get several refills on this med if possible because it helps her a lot.

## 2016-08-19 NOTE — Telephone Encounter (Signed)
Pt called and is requesting a refill on her Diclofenac pt uses [facility] [address], [place]

## 2016-08-19 NOTE — Telephone Encounter (Signed)
Is she taking it for joint pains or headaches?  If joint pains I'll refill, but if using for headaches regularly we need to come up with a better strategy.

## 2016-08-20 ENCOUNTER — Other Ambulatory Visit: Payer: Self-pay | Admitting: Medical

## 2016-08-20 MED ORDER — DICLOFENAC SODIUM 75 MG PO TBEC: 75.0000 mg | DELAYED_RELEASE_TABLET | Freq: Two times a day (BID) | ORAL | 3 refills | Status: DC

## 2016-08-20 NOTE — Telephone Encounter (Signed)
Pt called back and confirmed that she takes this med for joint pain

## 2016-10-01 DIAGNOSIS — M542 Cervicalgia: Secondary | ICD-10-CM | POA: Diagnosis not present

## 2016-10-01 DIAGNOSIS — G518 Other disorders of facial nerve: Secondary | ICD-10-CM | POA: Diagnosis not present

## 2016-10-01 DIAGNOSIS — G43019 Migraine without aura, intractable, without status migrainosus: Secondary | ICD-10-CM | POA: Diagnosis not present

## 2016-10-01 DIAGNOSIS — M791 Myalgia: Secondary | ICD-10-CM | POA: Diagnosis not present

## 2016-10-01 DIAGNOSIS — G43719 Chronic migraine without aura, intractable, without status migrainosus: Secondary | ICD-10-CM | POA: Diagnosis not present

## 2016-10-06 DIAGNOSIS — S134XXA Sprain of ligaments of cervical spine, initial encounter: Secondary | ICD-10-CM | POA: Diagnosis not present

## 2016-11-27 ENCOUNTER — Telehealth: Payer: Self-pay | Admitting: Medical

## 2016-11-27 NOTE — Telephone Encounter (Signed)
Pt requested refill on Voltaren 75 mg

## 2016-11-27 NOTE — Telephone Encounter (Signed)
Forwarding to [employer]

## 2016-11-28 ENCOUNTER — Other Ambulatory Visit: Payer: Self-pay

## 2016-11-28 MED ORDER — DICLOFENAC SODIUM 75 MG PO TBEC: 75.0000 mg | DELAYED_RELEASE_TABLET | Freq: Two times a day (BID) | ORAL | 3 refills | Status: DC

## 2016-11-28 NOTE — Telephone Encounter (Signed)
pls send refill

## 2016-11-28 NOTE — Telephone Encounter (Signed)
Med sent in.

## 2016-12-30 DIAGNOSIS — G43019 Migraine without aura, intractable, without status migrainosus: Secondary | ICD-10-CM | POA: Diagnosis not present

## 2016-12-30 DIAGNOSIS — G43719 Chronic migraine without aura, intractable, without status migrainosus: Secondary | ICD-10-CM | POA: Diagnosis not present

## 2017-01-12 ENCOUNTER — Encounter (HOSPITAL_COMMUNITY): Payer: Self-pay | Admitting: Nurse Practitioner

## 2017-01-12 ENCOUNTER — Emergency Department (HOSPITAL_COMMUNITY)
Admission: EM | Admit: 2017-01-12 | Discharge: 2017-01-12 | Disposition: A | Discharge status: Home / Self Care | Payer: BLUE CROSS/BLUE SHIELD | Attending: Emergency Medicine | Admitting: Emergency Medicine

## 2017-01-12 DIAGNOSIS — R51 Headache: Secondary | ICD-10-CM | POA: Insufficient documentation

## 2017-01-12 DIAGNOSIS — Z79899 Other long term (current) drug therapy: Secondary | ICD-10-CM | POA: Insufficient documentation

## 2017-01-12 DIAGNOSIS — J45909 Unspecified asthma, uncomplicated: Secondary | ICD-10-CM | POA: Insufficient documentation

## 2017-01-12 DIAGNOSIS — F17210 Nicotine dependence, cigarettes, uncomplicated: Secondary | ICD-10-CM | POA: Insufficient documentation

## 2017-01-12 DIAGNOSIS — R519 Headache, unspecified: Secondary | ICD-10-CM

## 2017-01-12 MED ORDER — SODIUM CHLORIDE 0.9 % IV BOLUS (SEPSIS)
1000.0000 mL | Freq: Once | INTRAVENOUS | Status: AC
  Administered 2017-01-12: 1000 mL via INTRAVENOUS

## 2017-01-12 MED ORDER — DIPHENHYDRAMINE HCL 50 MG/ML IJ SOLN
25.0000 mg | Freq: Once | INTRAMUSCULAR | Status: AC
  Administered 2017-01-12: 25 mg via INTRAVENOUS
  Filled 2017-01-12: qty 1

## 2017-01-12 MED ORDER — KETOROLAC TROMETHAMINE 15 MG/ML IJ SOLN
15.0000 mg | Freq: Once | INTRAMUSCULAR | Status: AC
  Administered 2017-01-12: 15 mg via INTRAVENOUS
  Filled 2017-01-12: qty 1

## 2017-01-12 MED ORDER — METOCLOPRAMIDE HCL 5 MG/ML IJ SOLN
10.0000 mg | Freq: Once | INTRAMUSCULAR | Status: AC
  Administered 2017-01-12: 10 mg via INTRAVENOUS
  Filled 2017-01-12: qty 2

## 2017-01-12 NOTE — ED Provider Notes (Signed)
[place] DEPT Provider Note   CSN: [id number] Arrival date & time: 01/12/17  1128   By signing my name below, I, [name], attest that this documentation has been prepared under the direction and in the presence of [name], MD. Electronically signed, [name], ED Scribe. 01/12/17. 3:39 PM.   History   Chief Complaint Chief Complaint  Patient presents with  . Migraine   The history is provided by the patient and medical records. No language interpreter was used.    [name] is a 51 y.o. female with h/o substance abuse and migraines presenting to the Emergency Department concerning a headache beginning ~3 AM today. Mild fever, neck stiffness and photophobia associated. Pt described 10/10, squeezing headache consistent with past migraines in triage. No relief noted from prescribed medications at home. She adds she could not get botox shots from the provider following her for this issue so she came to [facility] ED. Tobacco use noted. No trauma recently. No numbness or tingling. No other complaints at this time.   Past Medical History:  Diagnosis Date  . Adnexal mass 2006   Bilateral ovarian cystic masses- recomended GYN FU.   Marland Kitchen Alcohol abuse    Hx of, quit in 2009  . Ankle fracture    Bimalleolar sp closed reduction under floroscopy.   . Bronchial asthma   . Depression    Follows with [facility], history of voluntary admission to [facility].  History of suisidal ideation with drug od (50 pills of ibuprofen).   . History of cocaine abuse    Quit in 2009  . Marijuana abuse    Hx of, quit in 2009  . Menorrhagia 2006   Endometiral Biopsy- DEGENERATING SECRETORY-TYPE ENDOMETRIUM  . Migraine headache   . Normocytic anemia   . Tobacco abuse   . Transaminitis    Considered to be secondary to alchol use.     Patient Active Problem List   Diagnosis Date Noted  . Screen for STD (sexually transmitted disease) 05/07/2016  . Abdominal pain 05/07/2016  . Encounter for health  maintenance examination in adult 07/26/2015  . History of migraine headaches 07/26/2015  . Tobacco use 07/26/2015  . Vitamin D deficiency 07/26/2015  . Impaired fasting blood sugar 07/26/2015  . Family history of colon cancer 07/26/2015  . Vaccine counseling 07/26/2015  . History of substance abuse 07/26/2015  . Asthma, moderate persistent 07/26/2015  . Dyspnea 07/26/2015    Past Surgical History:  Procedure Laterality Date  . [place] CYST EXCISION  2015  . BILATERAL SALPINGECTOMY Bilateral 07/18/2014   Procedure: BILATERAL SALPINGECTOMY;  Surgeon: Luz Lex, MD;  Location: [place] [facility];  Service: Gynecology;  Laterality: Bilateral;  . Close reduction of bimalleolar ankle fracture     left  . LAPAROSCOPIC APPENDECTOMY  07/10/2011   Procedure: APPENDECTOMY LAPAROSCOPIC;  Surgeon: Rolm Bookbinder, MD;  Location: [facility];  Service: General;  Laterality: N/A;  . LAPAROSCOPY N/A 07/18/2014   Procedure: LAPAROSCOPY OPERATIVE WITH ENDOCATCH;  Surgeon: Luz Lex, MD;  Location: [place] [facility];  Service: Gynecology;  Laterality: N/A;  . LYSIS OF ADHESION N/A 07/18/2014   Procedure: LYSIS OF ADHESION;  Surgeon: Luz Lex, MD;  Location: [place] [facility];  Service: Gynecology;  Laterality: N/A;    OB History    No data available       Home Medications    Prior to Admission medications   Medication Sig Start Date End Date Taking? Authorizing Provider  albuterol (PROAIR HFA) 108 (90  Base) MCG/ACT inhaler Inhale 2 puffs into the lungs every 6 (six) hours as needed for wheezing or shortness of breath. 07/26/15   Tysinger, Camelia Eng, PA-C  budesonide-formoterol (SYMBICORT) 160-4.5 MCG/ACT inhaler Inhale 2 puffs into the lungs 2 (two) times daily. 07/09/15   Tysinger, Camelia Eng, PA-C  Cholecalciferol (VITAMIN D) 2000 UNITS tablet Take 1 tablet (2,000 Units total) by mouth daily. 11/09/14   Tysinger, Camelia Eng, PA-C  ciprofloxacin (CIPRO) 500 MG tablet Take 1 tablet (500 mg total) by mouth 2 (two) times daily.  05/07/16   Tysinger, Camelia Eng, PA-C  diclofenac (VOLTAREN) 75 MG EC tablet Take 1 tablet (75 mg total) by mouth 2 (two) times daily. 11/28/16   Tysinger, Camelia Eng, PA-C  estradiol (ESTRACE) 2 MG tablet Take 2 mg by mouth daily. Reported on 07/26/2015 08/10/14   [provider]  fluticasone (CUTIVATE) 0.05 % cream Apply topically 2 (two) times daily. 02/01/14   Billy Fischer, MD  linaclotide [facility]) 145 MCG CAPS capsule Take 1 capsule (145 mcg total) by mouth daily before breakfast. 12/19/15   Mauri Pole, MD  omeprazole (PRILOSEC) 40 MG capsule Take 1 capsule (40 mg total) by mouth daily. Patient not taking: Reported on 05/07/2016 03/25/16   Tysinger, Camelia Eng, PA-C  oxyCODONE-acetaminophen (PERCOCET) 5-325 MG tablet Take 1 tablet by mouth every 4 (four) hours as needed for severe pain. Patient not taking: Reported on 05/07/2016 12/04/15   Tysinger, Camelia Eng, PA-C  polyethylene glycol powder (GLYCOLAX/MIRALAX) powder Take 255 g by mouth daily. Patient not taking: Reported on 05/07/2016 12/04/15   Tysinger, Camelia Eng, PA-C  progesterone (PROMETRIUM) 100 MG capsule Reported on 12/04/2015 10/10/14   [provider]  promethazine (PHENERGAN) 25 MG tablet Take 1 tablet (25 mg total) by mouth every 8 (eight) hours as needed for nausea or vomiting. Patient not taking: Reported on 05/07/2016 03/18/16   Tysinger, Camelia Eng, PA-C  Vitamin D, Ergocalciferol, (DRISDOL) 50000 units CAPS capsule TAKE 1 CAPSULE (50,000 UNITS TOTAL) BY MOUTH EVERY 7 (SEVEN) DAYS. 05/16/16   Tysinger, Camelia Eng, PA-C  zonisamide (ZONEGRAN) 100 MG capsule Take 200 mg by mouth daily. 06/20/14   [provider]    Family History Family History  Problem Relation Age of Onset  . Diabetes Mother   . Hypertension Mother   . Colon cancer Mother 94  . Hypertension Father   . Stroke Father   . Diabetes Brother   . Obesity Brother   . Hypertension Brother   . Heart disease Neg Hx     Social History Social History   Substance Use Topics  . Smoking status: Current Every Day Smoker    Packs/day: 0.50    Years: 10.00    Types: Cigarettes  . Smokeless tobacco: Never Used  . Alcohol use No     Comment: .      Allergies   Patient has no known allergies.   Review of Systems Review of Systems  Constitutional: Positive for fever.  Eyes: Positive for photophobia.  Gastrointestinal: Positive for nausea. Negative for vomiting.  Musculoskeletal: Positive for neck stiffness.  Skin: Negative for color change and wound.  Neurological: Positive for headaches. Negative for weakness and numbness.  All other systems reviewed and are negative.    Physical Exam Updated Vital Signs BP (!) 147/93 (BP Location: Right Arm)   Pulse 71   Temp 98 F (36.7 C) (Oral)   Resp 18   Ht 5\' 6"  (1.676 m)   Wt  163 lb (73.9 kg)   LMP 12/24/2012   SpO2 100%   BMI 26.31 kg/m   Physical Exam  Constitutional: She is oriented to person, place, and time. She appears well-developed and well-nourished.  HENT:  Head: Normocephalic.  Eyes: Pupils are equal, round, and reactive to light. EOM are normal.  Neck: Normal range of motion. Neck supple.  No nuchal rigidity  Pulmonary/Chest: Effort normal.  Abdominal: She exhibits no distension.  Musculoskeletal: Normal range of motion.  Neurological: She is alert and oriented to person, place, and time. No cranial nerve deficit or sensory deficit. She exhibits normal muscle tone. Coordination normal.  Psychiatric: She has a normal mood and affect.  Nursing note and vitals reviewed.    ED Treatments / Results  DIAGNOSTIC STUDIES: Oxygen Saturation is 100% on RA, NL by my interpretation.    COORDINATION OF CARE: 3:17 PM-Discussed next steps with pt. Pt verbalized understanding and is agreeable with the plan. Will order IV medications.   Labs (all labs ordered are listed, but only abnormal results are displayed) Labs Reviewed - No data to display  EKG  EKG  Interpretation None       Radiology No results found.  Procedures Procedures (including critical care time)  Medications Ordered in ED Medications  sodium chloride 0.9 % bolus 1,000 mL (0 mLs Intravenous Stopped 01/12/17 1741)  ketorolac (TORADOL) 15 MG/ML injection 15 mg (15 mg Intravenous Given 01/12/17 1539)  metoCLOPramide (REGLAN) injection 10 mg (10 mg Intravenous Given 01/12/17 1540)  diphenhydrAMINE (BENADRYL) injection 25 mg (25 mg Intravenous Given 01/12/17 1539)     Initial Impression / Assessment and Plan / ED Course  I have reviewed the triage vital signs and the nursing notes.  Pertinent labs & imaging results that were available during my care of the patient were reviewed by me and considered in my medical decision making (see chart for details).     51 year old female with headache. Suspect primary HA. Consider emergent secondary causes such as bleed, infectious or mass but doubt. There is no history of trauma. Pt has a nonfocal neurological exam. Afebrile and neck supple. No use of blood thinning medication. Consider ocular etiology such as acute angle closure glaucoma but doubt. Pt denies acute change in visual acuity and eye exam unremarkable. Doubt temporal arteritis given age, no temporal tenderness and temporal artery pulsations palpable. Doubt CO poisoning. No contacts with similar symptoms. Doubt venous thrombosis. Doubt carotid or vertebral arteries dissection. Symptoms improved with meds. Feel that can be safely discharged, but strict return precautions discussed. Outpt fu.   Final Clinical Impressions(s) / ED Diagnoses   Final diagnoses:  Bad headache    New Prescriptions New Prescriptions   No medications on file   I personally preformed the services scribed in my presence. The recorded information has been reviewed is accurate. [name], MD.    [name], MD 01/29/17 [phone]

## 2017-01-12 NOTE — ED Notes (Signed)
Pt states she has a migraine and "wants her shots" also want note for work tonight. Pt states wasunable to see migraine dr as he is out till Wednesday.

## 2017-01-12 NOTE — ED Triage Notes (Signed)
Pt presents with c/o migraine. The migraine began last night. She describes as her typical migraine, with squeezing pain in both the front and back of her head/neck. She reports photophobia, nasuea, vomiting. shes tried her home medications with no relief

## 2017-07-09 DIAGNOSIS — Z01419 Encounter for gynecological examination (general) (routine) without abnormal findings: Secondary | ICD-10-CM | POA: Diagnosis not present

## 2017-07-09 DIAGNOSIS — Z6825 Body mass index (BMI) 25.0-25.9, adult: Secondary | ICD-10-CM | POA: Diagnosis not present

## 2017-07-09 DIAGNOSIS — Z1231 Encounter for screening mammogram for malignant neoplasm of breast: Secondary | ICD-10-CM | POA: Diagnosis not present

## 2017-07-27 ENCOUNTER — Other Ambulatory Visit: Payer: Self-pay | Admitting: Medical

## 2017-07-27 NOTE — Telephone Encounter (Signed)
Called and l/m for pt call us back to set up an appt for  For refill on meds.

## 2017-07-31 ENCOUNTER — Telehealth: Payer: Self-pay

## 2017-07-31 NOTE — Telephone Encounter (Signed)
Pt called to ask why med was denied and let her know that she would need to come in. Pt said she will call back [facility]

## 2018-02-14 ENCOUNTER — Other Ambulatory Visit: Payer: Self-pay

## 2018-02-14 ENCOUNTER — Emergency Department (HOSPITAL_COMMUNITY)
Admission: EM | Admit: 2018-02-14 | Discharge: 2018-02-14 | Disposition: A | Discharge status: Home / Self Care | Payer: BLUE CROSS/BLUE SHIELD | Attending: Emergency Medicine | Admitting: Emergency Medicine

## 2018-02-14 ENCOUNTER — Encounter (HOSPITAL_COMMUNITY): Payer: Self-pay | Admitting: Emergency Medicine

## 2018-02-14 DIAGNOSIS — F17210 Nicotine dependence, cigarettes, uncomplicated: Secondary | ICD-10-CM | POA: Diagnosis not present

## 2018-02-14 DIAGNOSIS — Z79899 Other long term (current) drug therapy: Secondary | ICD-10-CM | POA: Insufficient documentation

## 2018-02-14 DIAGNOSIS — M5412 Radiculopathy, cervical region: Secondary | ICD-10-CM | POA: Insufficient documentation

## 2018-02-14 DIAGNOSIS — M25512 Pain in left shoulder: Secondary | ICD-10-CM | POA: Diagnosis not present

## 2018-02-14 DIAGNOSIS — J4540 Moderate persistent asthma, uncomplicated: Secondary | ICD-10-CM | POA: Insufficient documentation

## 2018-02-14 MED ORDER — CYCLOBENZAPRINE HCL 10 MG PO TABS: 10.0000 mg | ORAL_TABLET | Freq: Two times a day (BID) | ORAL | 0 refills | Status: AC | PRN

## 2018-02-14 MED ORDER — CYCLOBENZAPRINE HCL 10 MG PO TABS
10.0000 mg | ORAL_TABLET | Freq: Once | ORAL | Status: AC
Start: 2018-02-14 — End: 2018-02-14
  Administered 2018-02-14: 10 mg via ORAL
  Filled 2018-02-14: qty 1

## 2018-02-14 MED ORDER — IBUPROFEN 800 MG PO TABS: 800.0000 mg | ORAL_TABLET | Freq: Three times a day (TID) | ORAL | 0 refills | Status: AC

## 2018-02-14 MED ORDER — KETOROLAC TROMETHAMINE 30 MG/ML IJ SOLN
30.0000 mg | Freq: Once | INTRAMUSCULAR | Status: AC
  Administered 2018-02-14: 30 mg via INTRAMUSCULAR
  Filled 2018-02-14: qty 1

## 2018-02-14 NOTE — ED Notes (Signed)
Patient verbalizes understanding of discharge instructions. Opportunity for questioning and answers were provided. Armband removed by staff, pt discharged from ED.  

## 2018-02-14 NOTE — ED Triage Notes (Signed)
Pt states 2 weeks of left arm pain, worse at shoulder joint, that is worse with movement. She uses a twist motion w the arm at work. No chest pain.

## 2018-02-14 NOTE — ED Provider Notes (Signed)
[facility] EMERGENCY DEPARTMENT Provider Note  CSN: [id number] Arrival date & time: 02/14/18  1738  History   Chief Complaint Chief Complaint  Patient presents with  . Arm Injury    HPI [name] is a 51 y.o. female with a medical history of asthma who presented to the ED for left shoulder pain x2 weeks. She describes aching pain in her shoulder that is worse with movements. She also describes burning and tingling in her left upper extremity from the shoulder to the hand. The patient complains of neck pain as well that she describes as tight and aching. Patient denies any injury, fall or trauma. She reports that her job requires her to do a lot of physical lifting, pushing and twisting with her arms. There is is not a history of a previous shoulder injury. Patient describes pain as aching. Pain severity at onset was moderate and now is moderate. The patient denies other injuries. Care prior to arrival consisted of Aleve, with intermittent relief.      Past Medical History:  Diagnosis Date  . Adnexal mass 2006   Bilateral ovarian cystic masses- recomended GYN FU.   Marland Kitchen Alcohol abuse    Hx of, quit in 2009  . Ankle fracture    Bimalleolar sp closed reduction under floroscopy.   . Bronchial asthma   . Depression    Follows with [facility], history of voluntary admission to [facility].  History of suisidal ideation with drug od (50 pills of ibuprofen).   . History of cocaine abuse    Quit in 2009  . Marijuana abuse    Hx of, quit in 2009  . Menorrhagia 2006   Endometiral Biopsy- DEGENERATING SECRETORY-TYPE ENDOMETRIUM  . Migraine headache   . Normocytic anemia   . Tobacco abuse   . Transaminitis    Considered to be secondary to alchol use.     Patient Active Problem List   Diagnosis Date Noted  . Screen for STD (sexually transmitted disease) 05/07/2016  . Abdominal pain 05/07/2016  . Encounter for health maintenance examination in adult 07/26/2015  . History of  migraine headaches 07/26/2015  . Tobacco use 07/26/2015  . Vitamin D deficiency 07/26/2015  . Impaired fasting blood sugar 07/26/2015  . Family history of colon cancer 07/26/2015  . Vaccine counseling 07/26/2015  . History of substance abuse 07/26/2015  . Asthma, moderate persistent 07/26/2015  . Dyspnea 07/26/2015    Past Surgical History:  Procedure Laterality Date  . [place] CYST EXCISION  2015  . BILATERAL SALPINGECTOMY Bilateral 07/18/2014   Procedure: BILATERAL SALPINGECTOMY;  Surgeon: Luz Lex, MD;  Location: [facility];  Service: Gynecology;  Laterality: Bilateral;  . Close reduction of bimalleolar ankle fracture     left  . LAPAROSCOPIC APPENDECTOMY  07/10/2011   Procedure: APPENDECTOMY LAPAROSCOPIC;  Surgeon: Rolm Bookbinder, MD;  Location: [facility];  Service: General;  Laterality: N/A;  . LAPAROSCOPY N/A 07/18/2014   Procedure: LAPAROSCOPY OPERATIVE WITH ENDOCATCH;  Surgeon: Luz Lex, MD;  Location: [facility];  Service: Gynecology;  Laterality: N/A;  . LYSIS OF ADHESION N/A 07/18/2014   Procedure: LYSIS OF ADHESION;  Surgeon: Luz Lex, MD;  Location: [facility];  Service: Gynecology;  Laterality: N/A;     OB History   None      Home Medications    Prior to Admission medications   Medication Sig Start Date End Date Taking? Authorizing Provider  albuterol (PROAIR HFA) 108 (90 Base) MCG/ACT inhaler  Inhale 2 puffs into the lungs every 6 (six) hours as needed for wheezing or shortness of breath. 07/26/15   Tysinger, Camelia Eng, PA-C  budesonide-formoterol (SYMBICORT) 160-4.5 MCG/ACT inhaler Inhale 2 puffs into the lungs 2 (two) times daily. 07/09/15   Tysinger, Camelia Eng, PA-C  Cholecalciferol (VITAMIN D) 2000 UNITS tablet Take 1 tablet (2,000 Units total) by mouth daily. 11/09/14   Tysinger, Camelia Eng, PA-C  ciprofloxacin (CIPRO) 500 MG tablet Take 1 tablet (500 mg total) by mouth 2 (two) times daily. 05/07/16   Tysinger, Camelia Eng, PA-C  cyclobenzaprine (FLEXERIL) 10 MG tablet  Take 1 tablet (10 mg total) by mouth 2 (two) times daily as needed for up to 10 days for muscle spasms. 02/14/18 02/24/18  Mortis, Alvie Heidelberg I, PA-C  diclofenac (VOLTAREN) 75 MG EC tablet Take 1 tablet (75 mg total) by mouth 2 (two) times daily. 11/28/16   Tysinger, Camelia Eng, PA-C  estradiol (ESTRACE) 2 MG tablet Take 2 mg by mouth daily. Reported on 07/26/2015 08/10/14   [provider]  fluticasone (CUTIVATE) 0.05 % cream Apply topically 2 (two) times daily. 02/01/14   Billy Fischer, MD  ibuprofen (ADVIL,MOTRIN) 800 MG tablet Take 1 tablet (800 mg total) by mouth 3 (three) times daily for 10 days. 02/14/18 02/24/18  Mortis, Jonelle Sports, PA-C  linaclotide (LINZESS) 145 MCG CAPS capsule Take 1 capsule (145 mcg total) by mouth daily before breakfast. 12/19/15   Mauri Pole, MD  omeprazole (PRILOSEC) 40 MG capsule Take 1 capsule (40 mg total) by mouth daily. Patient not taking: Reported on 05/07/2016 03/25/16   Tysinger, Camelia Eng, PA-C  oxyCODONE-acetaminophen (PERCOCET) 5-325 MG tablet Take 1 tablet by mouth every 4 (four) hours as needed for severe pain. Patient not taking: Reported on 05/07/2016 12/04/15   Tysinger, Camelia Eng, PA-C  polyethylene glycol powder (GLYCOLAX/MIRALAX) powder Take 255 g by mouth daily. Patient not taking: Reported on 05/07/2016 12/04/15   Tysinger, Camelia Eng, PA-C  progesterone (PROMETRIUM) 100 MG capsule Reported on 12/04/2015 10/10/14   [provider]  promethazine (PHENERGAN) 25 MG tablet Take 1 tablet (25 mg total) by mouth every 8 (eight) hours as needed for nausea or vomiting. Patient not taking: Reported on 05/07/2016 03/18/16   Tysinger, Camelia Eng, PA-C  Vitamin D, Ergocalciferol, (DRISDOL) 50000 units CAPS capsule TAKE 1 CAPSULE (50,000 UNITS TOTAL) BY MOUTH EVERY 7 (SEVEN) DAYS. 05/16/16   Tysinger, Camelia Eng, PA-C  zonisamide (ZONEGRAN) 100 MG capsule Take 200 mg by mouth daily. 06/20/14   [provider]    Family History Family History  Problem  Relation Age of Onset  . Diabetes Mother   . Hypertension Mother   . Colon cancer Mother 21  . Hypertension Father   . Stroke Father   . Diabetes Brother   . Obesity Brother   . Hypertension Brother   . Heart disease Neg Hx     Social History Social History   Tobacco Use  . Smoking status: Current Every Day Smoker    Packs/day: 0.50    Years: 10.00    Pack years: 5.00    Types: Cigarettes  . Smokeless tobacco: Never Used  Substance Use Topics  . Alcohol use: No    Comment: .   Marland Kitchen Drug use: No    Comment: Quit Coccaine and marijuana in 2009; denies     Allergies   Patient has no known allergies.   Review of Systems Review of Systems  Constitutional: Negative.   Musculoskeletal:  Positive for arthralgias and neck pain. Negative for back pain, myalgias and neck stiffness.  Skin: Negative.   Neurological: Positive for numbness. Negative for weakness.  Psychiatric/Behavioral: Negative.    Physical Exam Updated Vital Signs BP (!) 148/95   Pulse 70   Temp 97.8 F (36.6 C)   Resp 18   LMP 12/24/2012   SpO2 98%   Physical Exam  Constitutional: She appears well-developed and well-nourished. She is cooperative.  Neck: Normal range of motion and full passive range of motion without pain. Neck supple. Muscular tenderness present. No spinous process tenderness present. No neck rigidity. Normal range of motion present.  Musculoskeletal:       Left shoulder: She exhibits tenderness. She exhibits normal range of motion, no bony tenderness and no deformity.       Cervical back: She exhibits tenderness and spasm. She exhibits normal range of motion and no bony tenderness.  Full ROM in upper extremities bilaterally with 5/5 strength. Left bicep tendons insertion tender to palpation. Left trapezius and cervical paraspinal muscles in spasm. No midline spine tenderness or bony tenderness of upper extremities. Negative rotator cuff/impingement tests.  Neurological: She is alert. She  has normal strength. She displays no atrophy. No sensory deficit. She exhibits normal muscle tone.  Reflex Scores:      Tricep reflexes are 2+ on the right side and 2+ on the left side.      Bicep reflexes are 2+ on the right side and 2+ on the left side.      Brachioradialis reflexes are 2+ on the right side and 2+ on the left side. Skin: Skin is warm and intact. Capillary refill takes less than 2 seconds.  Nursing note and vitals reviewed.  ED Treatments / Results  Labs (all labs ordered are listed, but only abnormal results are displayed) Labs Reviewed - No data to display  EKG None  Radiology No results found.  Procedures Procedures (including critical care time)  Medications Ordered in ED Medications  ketorolac (TORADOL) 30 MG/ML injection 30 mg (has no administration in time range)  cyclobenzaprine (FLEXERIL) tablet 10 mg (has no administration in time range)     Initial Impression / Assessment and Plan / ED Course  Triage vital signs and the nursing notes have been reviewed.  Pertinent labs & imaging results that were available during care of the patient were reviewed and considered in medical decision making (see chart for details).   Patient is in no distress and well appearing. Patient has full sensation in left upper extremity. She also has full active and passive ROM. No deformities, decreased muscle tone or other abnormalities visualized. Neurovascular function is intact. There are no other physical exam findings or s/s that suggest an underlying infectious or rheumatologic process that warrant further evaluation or intervention today. No deformities, bony tenderness or history of trauma/fall/injury that warrant imaging today. History of radiating numbness and tingling consistent with neuropathic pain likely 2/2 cervical radiculopathy that is overlying MSK pain in shoulder from overuse due to her job.  Final Clinical Impressions(s) / ED Diagnoses  1. Left Shoulder  Pain. Education provided on OTC and supportive treatment for pain relief and inflammation. Rx for Flexeril for muscle spasm in trapezius. 2. Cervical Radiculopathy. Neck exercises given along with conservative treatment options. Advised to follow-up with PCP in 4-6 weeks if symptoms persists and are significant.  Dispo: Home. After thorough clinical evaluation, this patient is determined to be medically stable and can be safely discharged with  the previously mentioned treatment and/or outpatient follow-up/referral(s). At this time, there are no other apparent medical conditions that require further screening, evaluation or treatment.   Final diagnoses:  Acute pain of left shoulder  Cervical radiculopathy    ED Discharge Orders         Ordered    cyclobenzaprine (FLEXERIL) 10 MG tablet  2 times daily PRN     02/14/18 2027    ibuprofen (ADVIL,MOTRIN) 800 MG tablet  3 times daily     02/14/18 2027            MortisHoover Brunette 02/14/18 2045    Malvin Johns, MD 02/14/18 2358

## 2018-02-14 NOTE — Discharge Instructions (Signed)
As we discussed, I believe you have muscle strain in your shoulder along with possible nerve compression that is causing the burning and tingling. I have no concern that you have any dislocations or broken bones.  I have prescribed you a muscle relaxer that will help with the tightness in your neck and upper shoulder. You may use Tylenol and/or Ibuprofen for pain relief and swelling. You may also use warm or cold compresses for additional relief. I have given you some exercises to do daily to help with your neck as well.

## 2018-04-01 ENCOUNTER — Other Ambulatory Visit: Payer: Self-pay

## 2018-04-01 ENCOUNTER — Emergency Department (HOSPITAL_COMMUNITY)
Admission: EM | Admit: 2018-04-01 | Discharge: 2018-04-02 | Disposition: A | Discharge status: Home / Self Care | Payer: BLUE CROSS/BLUE SHIELD | Attending: Emergency Medicine | Admitting: Emergency Medicine

## 2018-04-01 ENCOUNTER — Encounter (HOSPITAL_COMMUNITY): Payer: Self-pay | Admitting: *Deleted

## 2018-04-01 DIAGNOSIS — J45909 Unspecified asthma, uncomplicated: Secondary | ICD-10-CM | POA: Insufficient documentation

## 2018-04-01 DIAGNOSIS — M545 Low back pain, unspecified: Secondary | ICD-10-CM

## 2018-04-01 DIAGNOSIS — F17210 Nicotine dependence, cigarettes, uncomplicated: Secondary | ICD-10-CM | POA: Diagnosis not present

## 2018-04-01 DIAGNOSIS — R112 Nausea with vomiting, unspecified: Secondary | ICD-10-CM | POA: Insufficient documentation

## 2018-04-01 DIAGNOSIS — R109 Unspecified abdominal pain: Secondary | ICD-10-CM | POA: Diagnosis not present

## 2018-04-01 DIAGNOSIS — Z79899 Other long term (current) drug therapy: Secondary | ICD-10-CM | POA: Diagnosis not present

## 2018-04-01 LAB — CBC WITH DIFFERENTIAL/PLATELET
Abs Immature Granulocytes: 0.04 10*3/uL (ref 0.00–0.07)
BASOS ABS: 0 10*3/uL (ref 0.0–0.1)
Basophils Relative: 0 %
EOS PCT: 1 %
Eosinophils Absolute: 0.2 10*3/uL (ref 0.0–0.5)
HCT: 43.5 % (ref 36.0–46.0)
HEMOGLOBIN: 14.2 g/dL (ref 12.0–15.0)
IMMATURE GRANULOCYTES: 0 %
Lymphocytes Relative: 43 %
Lymphs Abs: 5.2 10*3/uL — ABNORMAL HIGH (ref 0.7–4.0)
MCH: 32.3 pg (ref 26.0–34.0)
MCHC: 32.6 g/dL (ref 30.0–36.0)
MCV: 98.9 fL (ref 80.0–100.0)
Monocytes Absolute: 0.8 10*3/uL (ref 0.1–1.0)
Monocytes Relative: 7 %
NEUTROS ABS: 5.7 10*3/uL (ref 1.7–7.7)
NEUTROS PCT: 49 %
NRBC: 0 % (ref 0.0–0.2)
Platelets: 193 10*3/uL (ref 150–400)
RBC: 4.4 MIL/uL (ref 3.87–5.11)
RDW: 13.3 % (ref 11.5–15.5)
WBC: 11.9 10*3/uL — AB (ref 4.0–10.5)

## 2018-04-01 LAB — URINALYSIS, ROUTINE W REFLEX MICROSCOPIC
BILIRUBIN URINE: NEGATIVE
Glucose, UA: NEGATIVE mg/dL
Hgb urine dipstick: NEGATIVE
KETONES UR: NEGATIVE mg/dL
LEUKOCYTES UA: NEGATIVE
NITRITE: NEGATIVE
Protein, ur: NEGATIVE mg/dL
Specific Gravity, Urine: 1.021 (ref 1.005–1.030)
pH: 5 (ref 5.0–8.0)

## 2018-04-01 LAB — BASIC METABOLIC PANEL
ANION GAP: 10 (ref 5–15)
BUN: 17 mg/dL (ref 6–20)
CHLORIDE: 103 mmol/L (ref 98–111)
CO2: 21 mmol/L — AB (ref 22–32)
CREATININE: 1.01 mg/dL — AB (ref 0.44–1.00)
Calcium: 8.8 mg/dL — ABNORMAL LOW (ref 8.9–10.3)
GFR calc non Af Amer: >60 mL/min (ref >60)
Glucose, Bld: 101 mg/dL — ABNORMAL HIGH (ref 70–99)
POTASSIUM: 3.9 mmol/L (ref 3.5–5.1)
SODIUM: 134 mmol/L — AB (ref 135–145)

## 2018-04-01 LAB — RAPID URINE DRUG SCREEN, HOSP PERFORMED
Amphetamines: NOT DETECTED
BENZODIAZEPINES: NOT DETECTED
Barbiturates: NOT DETECTED
COCAINE: POSITIVE — AB
OPIATES: NOT DETECTED
Tetrahydrocannabinol: POSITIVE — AB

## 2018-04-01 MED ORDER — KETOROLAC TROMETHAMINE 30 MG/ML IJ SOLN
30.0000 mg | Freq: Once | INTRAMUSCULAR | Status: AC
Start: 2018-04-01 — End: 2018-04-01
  Administered 2018-04-01: 30 mg via INTRAVENOUS
  Filled 2018-04-01: qty 1

## 2018-04-01 MED ORDER — SODIUM CHLORIDE 0.9 % IV BOLUS
1000.0000 mL | Freq: Once | INTRAVENOUS | Status: AC
  Administered 2018-04-01: 1000 mL via INTRAVENOUS

## 2018-04-01 MED ORDER — ONDANSETRON HCL 4 MG/2ML IJ SOLN
4.0000 mg | Freq: Once | INTRAMUSCULAR | Status: AC
  Administered 2018-04-01: 4 mg via INTRAVENOUS
  Filled 2018-04-01: qty 2

## 2018-04-01 NOTE — ED Triage Notes (Signed)
Pt reports L lower back pain x 2 days.  Denies injury.  Had abd pain yesterday that radiates to her back.  Very tender on palpation.

## 2018-04-01 NOTE — ED Provider Notes (Signed)
[facility] EMERGENCY DEPARTMENT Provider Note   CSN: [id number] Arrival date & time: 04/01/18  2228     History   Chief Complaint Chief Complaint  Patient presents with  . Back Pain    HPI [name] is a 52 y.o. female.  HPI   [name] is a 52 y.o. female, with a history of past substance abuse, asthma, presenting to the ED with left lower back pain beginning 2 days ago.  Patient was at rest when the pain began.  She gives a vague description, but rates it 10/10, constant, currently nonradiating, but yesterday she had some pain radiating into the left flank.  She has not felt this pain before.  Accompanied by one instance of nausea and vomiting. Denies fever/chills, diarrhea, hematochezia/melena, dysuria, hematuria, current abdominal pain, numbness, weakness, abnormal vaginal discharge, vaginal bleeding, or any other complaints.   Past Medical History:  Diagnosis Date  . Adnexal mass 2006   Bilateral ovarian cystic masses- recomended GYN FU.   Marland Kitchen Alcohol abuse    Hx of, quit in 2009  . Ankle fracture    Bimalleolar sp closed reduction under floroscopy.   . Bronchial asthma   . Depression    Follows with [facility], history of voluntary admission to [facility].  History of suisidal ideation with drug od (50 pills of ibuprofen).   . History of cocaine abuse ([place])    Quit in 2009  . Marijuana abuse    Hx of, quit in 2009  . Menorrhagia 2006   Endometiral Biopsy- DEGENERATING SECRETORY-TYPE ENDOMETRIUM  . Migraine headache   . Normocytic anemia   . Tobacco abuse   . Transaminitis    Considered to be secondary to alchol use.     Patient Active Problem List   Diagnosis Date Noted  . Screen for STD (sexually transmitted disease) 05/07/2016  . Abdominal pain 05/07/2016  . Encounter for health maintenance examination in adult 07/26/2015  . History of migraine headaches 07/26/2015  . Tobacco use 07/26/2015  . Vitamin D deficiency 07/26/2015  . Impaired  fasting blood sugar 07/26/2015  . Family history of colon cancer 07/26/2015  . Vaccine counseling 07/26/2015  . History of substance abuse ([place]) 07/26/2015  . Asthma, moderate persistent 07/26/2015  . Dyspnea 07/26/2015    Past Surgical History:  Procedure Laterality Date  . [place] CYST EXCISION  2015  . BILATERAL SALPINGECTOMY Bilateral 07/18/2014   Procedure: BILATERAL SALPINGECTOMY;  Surgeon: Luz Lex, MD;  Location: [facility];  Service: Gynecology;  Laterality: Bilateral;  . Close reduction of bimalleolar ankle fracture     left  . LAPAROSCOPIC APPENDECTOMY  07/10/2011   Procedure: APPENDECTOMY LAPAROSCOPIC;  Surgeon: Rolm Bookbinder, MD;  Location: [facility];  Service: General;  Laterality: N/A;  . LAPAROSCOPY N/A 07/18/2014   Procedure: LAPAROSCOPY OPERATIVE WITH ENDOCATCH;  Surgeon: Luz Lex, MD;  Location: [facility];  Service: Gynecology;  Laterality: N/A;  . LYSIS OF ADHESION N/A 07/18/2014   Procedure: LYSIS OF ADHESION;  Surgeon: Luz Lex, MD;  Location: [facility];  Service: Gynecology;  Laterality: N/A;     OB History   None      Home Medications    Prior to Admission medications   Medication Sig Start Date End Date Taking? Authorizing Provider  albuterol (PROAIR HFA) 108 (90 Base) MCG/ACT inhaler Inhale 2 puffs into the lungs every 6 (six) hours as needed for wheezing or shortness of breath. 07/26/15   Chana Bode  S, PA-C  budesonide-formoterol (SYMBICORT) 160-4.5 MCG/ACT inhaler Inhale 2 puffs into the lungs 2 (two) times daily. 07/09/15   Tysinger, Camelia Eng, PA-C  Cholecalciferol (VITAMIN D) 2000 UNITS tablet Take 1 tablet (2,000 Units total) by mouth daily. 11/09/14   Tysinger, Camelia Eng, PA-C  ciprofloxacin (CIPRO) 500 MG tablet Take 1 tablet (500 mg total) by mouth 2 (two) times daily. 05/07/16   Tysinger, Camelia Eng, PA-C  diclofenac (VOLTAREN) 75 MG EC tablet Take 1 tablet (75 mg total) by mouth 2 (two) times daily. 11/28/16   Tysinger, Camelia Eng, PA-C    diclofenac sodium (VOLTAREN) 1 % GEL Apply 4 g topically 4 (four) times daily. 04/02/18   Andrya Roppolo C, PA-C  estradiol (ESTRACE) 2 MG tablet Take 2 mg by mouth daily. Reported on 07/26/2015 08/10/14   [provider]  fluticasone (CUTIVATE) 0.05 % cream Apply topically 2 (two) times daily. 02/01/14   Billy Fischer, MD  HYDROcodone-acetaminophen (NORCO/VICODIN) 5-325 MG tablet Take 1 tablet by mouth every 6 (six) hours as needed for severe pain. 04/02/18   Melquan Ernsberger C, PA-C  lidocaine (LIDODERM) 5 % Place 1 patch onto the skin daily. Remove & Discard patch within 12 hours or as directed by MD 04/02/18   Lorayne Bender, PA-C  linaclotide (LINZESS) 145 MCG CAPS capsule Take 1 capsule (145 mcg total) by mouth daily before breakfast. 12/19/15   Nandigam, Venia Minks, MD  methocarbamol (ROBAXIN) 500 MG tablet Take 1 tablet (500 mg total) by mouth 2 (two) times daily. 04/02/18   Jillann Charette C, PA-C  omeprazole (PRILOSEC) 40 MG capsule Take 1 capsule (40 mg total) by mouth daily. Patient not taking: Reported on 05/07/2016 03/25/16   Tysinger, Camelia Eng, PA-C  oxyCODONE-acetaminophen (PERCOCET) 5-325 MG tablet Take 1 tablet by mouth every 4 (four) hours as needed for severe pain. Patient not taking: Reported on 05/07/2016 12/04/15   Tysinger, Camelia Eng, PA-C  polyethylene glycol powder (GLYCOLAX/MIRALAX) powder Take 255 g by mouth daily. Patient not taking: Reported on 05/07/2016 12/04/15   Tysinger, Camelia Eng, PA-C  progesterone (PROMETRIUM) 100 MG capsule Reported on 12/04/2015 10/10/14   [provider]  promethazine (PHENERGAN) 25 MG tablet Take 1 tablet (25 mg total) by mouth every 8 (eight) hours as needed for nausea or vomiting. Patient not taking: Reported on 05/07/2016 03/18/16   Tysinger, Camelia Eng, PA-C  Vitamin D, Ergocalciferol, (DRISDOL) 50000 units CAPS capsule TAKE 1 CAPSULE (50,000 UNITS TOTAL) BY MOUTH EVERY 7 (SEVEN) DAYS. 05/16/16   Tysinger, Camelia Eng, PA-C  zonisamide (ZONEGRAN) 100 MG  capsule Take 200 mg by mouth daily. 06/20/14   [provider]    Family History Family History  Problem Relation Age of Onset  . Diabetes Mother   . Hypertension Mother   . Colon cancer Mother 2  . Hypertension Father   . Stroke Father   . Diabetes Brother   . Obesity Brother   . Hypertension Brother   . Heart disease Neg Hx     Social History Social History   Tobacco Use  . Smoking status: Current Every Day Smoker    Packs/day: 0.50    Years: 10.00    Pack years: 5.00    Types: Cigarettes  . Smokeless tobacco: Never Used  Substance Use Topics  . Alcohol use: No    Comment: .   Marland Kitchen Drug use: Yes    Types: Marijuana    Comment: Quit Coccaine and marijuana in 2009; denies  Allergies   Patient has no known allergies.   Review of Systems Review of Systems  Constitutional: Negative for chills and fever.  Respiratory: Negative for cough and shortness of breath.   Cardiovascular: Negative for chest pain.  Gastrointestinal: Positive for nausea and vomiting (resolved). Negative for abdominal pain, blood in stool, constipation and diarrhea.  Genitourinary: Negative for dysuria, frequency, hematuria, vaginal bleeding and vaginal discharge.  Musculoskeletal: Positive for back pain.  Neurological: Negative for weakness and numbness.  All other systems reviewed and are negative.    Physical Exam Updated Vital Signs BP (!) 130/96 (BP Location: Right Arm)   Pulse 82   Temp 98 F (36.7 C) (Oral)   Resp 16   LMP 12/24/2012   SpO2 98%   Physical Exam  Constitutional: She appears well-developed and well-nourished. No distress.  HENT:  Head: Normocephalic and atraumatic.  Eyes: Conjunctivae are normal.  Neck: Neck supple.  Cardiovascular: Normal rate, regular rhythm, normal heart sounds and intact distal pulses.  Pulmonary/Chest: Effort normal and breath sounds normal. No respiratory distress.  Abdominal: Soft. There is no tenderness. There is no guarding.   Musculoskeletal: She exhibits tenderness. She exhibits no edema.       Back:  Patient is quite tender through the left lower and mid back.  Lymphadenopathy:    She has no cervical adenopathy.  Neurological: She is alert.  Sensation grossly intact to light touch in the lower extremities bilaterally. No saddle anesthesias. Strength 5/5 in the bilateral lower extremities. Ambulatory without assistance.  Skin: Skin is warm and dry. She is not diaphoretic.  Psychiatric: She has a normal mood and affect. Her behavior is normal.  Nursing note and vitals reviewed.    ED Treatments / Results  Labs (all labs ordered are listed, but only abnormal results are displayed) Labs Reviewed  BASIC METABOLIC PANEL - Abnormal; Notable for the following components:      Result Value   Sodium 134 (*)    CO2 21 (*)    Glucose, Bld 101 (*)    Creatinine, Ser 1.01 (*)    Calcium 8.8 (*)    All other components within normal limits  CBC WITH DIFFERENTIAL/PLATELET - Abnormal; Notable for the following components:   WBC 11.9 (*)    Lymphs Abs 5.2 (*)    All other components within normal limits  RAPID URINE DRUG SCREEN, HOSP PERFORMED - Abnormal; Notable for the following components:   Cocaine POSITIVE (*)    Tetrahydrocannabinol POSITIVE (*)    All other components within normal limits  URINALYSIS, ROUTINE W REFLEX MICROSCOPIC    EKG None  Radiology Ct Renal Stone Study  Result Date: 04/02/2018 CLINICAL DATA:  Flank pain. Stone disease suspected. Left-sided low back pain for 2 days. EXAM: CT ABDOMEN AND PELVIS WITHOUT CONTRAST TECHNIQUE: Multidetector CT imaging of the abdomen and pelvis was performed following the standard protocol without IV contrast. COMPARISON:  CT of the abdomen and pelvis 12/04/2015 FINDINGS: Lower chest: Centrilobular emphysema is present. No focal airspace disease or nodule is present. The heart size is normal. Hepatobiliary: No focal liver abnormality is seen. No  gallstones, gallbladder wall thickening, or biliary dilatation. Pancreas: Unremarkable. No pancreatic ductal dilatation or surrounding inflammatory changes. Spleen: Normal in size without focal abnormality. Adrenals/Urinary Tract: The adrenal glands are normal bilaterally. The kidneys and ureters are within normal limits. There is no stone or mass lesion. The urinary bladder is within normal limits. Stomach/Bowel: Stomach and duodenum are within normal limits. The  small bowel is unremarkable. Terminal ileum is within normal limits. Search scratched at the appendix is surgically absent. The ascending and transverse colon are within normal limits. The descending and sigmoid colon are normal. Vascular/Lymphatic: Atherosclerotic calcifications are present in the proximal iliac arteries. No significant aortic calcifications are present. There is no significant retroperitoneal adenopathy. Reproductive: Uterus and bilateral adnexa are unremarkable. Other: No abdominal wall hernia or abnormality. No abdominopelvic ascites. Musculoskeletal: Disc disease is evident at scratched at disc protrusions are evident at L4-5 and L5-S1. There is straightening of the normal lumbar lordosis. No focal lytic or blastic lesions are present. Bony pelvis is within normal limits. Hips are located and normal bilaterally. IMPRESSION: 1. No acute or focal abnormality to explain left flank or low back pain. 2. Normal appearance of the kidneys and ureters bilaterally. 3. Appendectomy. 4. Minimal atherosclerotic changes in the proximal iliac arteries without other significant aortic disease. 5. Degenerative disc disease is most evident at L4-5 and L5-S1. Electronically Signed   By: San Morelle M.D.   On: 04/02/2018 00:29    Procedures Procedures (including critical care time)  Medications Ordered in ED Medications  HYDROcodone-acetaminophen (NORCO/VICODIN) 5-325 MG per tablet 2 tablet (has no administration in time range)    ketorolac (TORADOL) 30 MG/ML injection 30 mg (30 mg Intravenous Given 04/01/18 2334)  ondansetron (ZOFRAN) injection 4 mg (4 mg Intravenous Given 04/01/18 2336)  sodium chloride 0.9 % bolus 1,000 mL (1,000 mLs Intravenous New Bag/Given 04/01/18 2334)     Initial Impression / Assessment and Plan / ED Course  I have reviewed the triage vital signs and the nursing notes.  Pertinent labs & imaging results that were available during my care of the patient were reviewed by me and considered in my medical decision making (see chart for details).  Clinical Course as of Apr 02 38  Fri Apr 02, 2018  0000 Patient states her pain has improved.  Denies onset of additional symptoms.   [SJ]    Clinical Course User Index [SJ] Shawnita Krizek C, PA-C    Patient presents with sudden onset of left lower back pain.  The cause of the patient's pain is most likely to be muscular in origin due to the amount of tenderness she has with even light touch and its lack of variability, however, the sudden onset of her pain and the fact that it radiated into the right left flank and left abdomen does give some concern for renal stone.  Her UA did not show any abnormalities, however, this does not rule out the presence of stone.  For this reason, we will add CT renal stone study.  No acute abnormalities noted on CT.  Patient showed improvement during ED course. The patient was given instructions for home care as well as return precautions. Patient voices understanding of these instructions, accepts the plan, and is comfortable with discharge.    Final Clinical Impressions(s) / ED Diagnoses   Final diagnoses:  Acute left-sided low back pain without sciatica    ED Discharge Orders         Ordered    lidocaine (LIDODERM) 5 %  Every 24 hours     04/02/18 0037    methocarbamol (ROBAXIN) 500 MG tablet  2 times daily     04/02/18 0037    HYDROcodone-acetaminophen (NORCO/VICODIN) 5-325 MG tablet  Every 6 hours PRN      04/02/18 0037    diclofenac sodium (VOLTAREN) 1 % GEL  4 times daily  04/02/18 0037           Lorayne Bender, PA-C 04/02/18 8466    Duffy Bruce, MD 04/02/18 1158

## 2018-04-02 ENCOUNTER — Emergency Department (HOSPITAL_COMMUNITY): Payer: BLUE CROSS/BLUE SHIELD

## 2018-04-02 DIAGNOSIS — R109 Unspecified abdominal pain: Secondary | ICD-10-CM | POA: Diagnosis not present

## 2018-04-02 MED ORDER — HYDROCODONE-ACETAMINOPHEN 5-325 MG PO TABS: 1.0000 | ORAL_TABLET | Freq: Four times a day (QID) | ORAL | 0 refills | Status: DC | PRN

## 2018-04-02 MED ORDER — LIDOCAINE 5 % EX PTCH: 1.0000 | MEDICATED_PATCH | CUTANEOUS | 0 refills | Status: DC

## 2018-04-02 MED ORDER — DICLOFENAC SODIUM 1 % TD GEL: 4.0000 g | Freq: Four times a day (QID) | TRANSDERMAL | 0 refills | Status: DC

## 2018-04-02 MED ORDER — HYDROCODONE-ACETAMINOPHEN 5-325 MG PO TABS
2.0000 | ORAL_TABLET | Freq: Once | ORAL | Status: AC
  Administered 2018-04-02: 2 via ORAL
  Filled 2018-04-02: qty 2

## 2018-04-02 MED ORDER — METHOCARBAMOL 500 MG PO TABS: 500.0000 mg | ORAL_TABLET | Freq: Two times a day (BID) | ORAL | 0 refills | Status: DC

## 2018-04-02 NOTE — Discharge Instructions (Addendum)
Expect your soreness to increase over the next 2-3 days. Take it easy, but do not lay around too much as this may make any stiffness worse.  Antiinflammatory medications: Take 600 mg of ibuprofen every 6 hours or 440 mg (over the counter dose) to 500 mg (prescription dose) of naproxen every 12 hours for the next 3 days. After this time, these medications may be used as needed for pain. Take these medications with food to avoid upset stomach. Choose only one of these medications, do not take them together. Acetaminophen (generic for Tylenol): Should you continue to have additional pain while taking the ibuprofen or naproxen, you may add in acetaminophen as needed. Your daily total maximum amount of acetaminophen from all sources should be limited to 4000mg /day for persons without liver problems, or 2000mg /day for those with liver problems. Vicodin: May take Vicodin (hydrocodone-acetaminophen) as needed for severe pain.  Do not drive or perform other dangerous activities while taking the Vicodin.  Please note that each pill of Vicodin contains 325 mg of acetaminophen (Tylenol) and the above dosage limits apply. Diclofenac gel: This is a topical anti-inflammatory medication.  It may be used as an alternative to the above anti-inflammatory regimen. Muscle relaxer: Robaxin is a muscle relaxer and may help loosen stiff muscles. Do not take the Robaxin while driving or performing other dangerous activities.  Lidocaine patches: These are available via either prescription or over-the-counter. The over-the-counter option may be more economical one and are likely just as effective. There are multiple over-the-counter brands, such as Salonpas. Exercises: Be sure to perform the attached exercises starting with three times a week and working up to performing them daily. This is an essential part of preventing long term problems.  Follow up: Follow up with a primary care provider for any future management of these  complaints. Be sure to follow up within 7-10 days. Return: Return to the ED should symptoms worsen.  For prescription assistance, may try using prescription discount sites or apps, such as goodrx.com

## 2018-04-02 NOTE — ED Notes (Signed)
Patient transported to CT 

## 2018-05-03 DIAGNOSIS — W57XXXA Bitten or stung by nonvenomous insect and other nonvenomous arthropods, initial encounter: Secondary | ICD-10-CM | POA: Diagnosis not present

## 2018-05-03 DIAGNOSIS — S40862A Insect bite (nonvenomous) of left upper arm, initial encounter: Secondary | ICD-10-CM | POA: Diagnosis not present

## 2018-05-30 DIAGNOSIS — K5900 Constipation, unspecified: Secondary | ICD-10-CM | POA: Diagnosis not present

## 2018-06-09 ENCOUNTER — Observation Stay (HOSPITAL_COMMUNITY)
Admission: EM | Admit: 2018-06-09 | Discharge: 2018-06-11 | Disposition: A | Discharge status: Home / Self Care | Payer: BLUE CROSS/BLUE SHIELD | Attending: Internal Medicine | Admitting: Internal Medicine

## 2018-06-09 ENCOUNTER — Other Ambulatory Visit: Payer: Self-pay

## 2018-06-09 ENCOUNTER — Encounter (HOSPITAL_COMMUNITY): Payer: Self-pay | Admitting: Emergency Medicine

## 2018-06-09 DIAGNOSIS — R9431 Abnormal electrocardiogram [ECG] [EKG]: Secondary | ICD-10-CM | POA: Insufficient documentation

## 2018-06-09 DIAGNOSIS — F1410 Cocaine abuse, uncomplicated: Secondary | ICD-10-CM | POA: Diagnosis not present

## 2018-06-09 DIAGNOSIS — J4540 Moderate persistent asthma, uncomplicated: Secondary | ICD-10-CM | POA: Diagnosis not present

## 2018-06-09 DIAGNOSIS — R200 Anesthesia of skin: Secondary | ICD-10-CM | POA: Diagnosis not present

## 2018-06-09 DIAGNOSIS — F17210 Nicotine dependence, cigarettes, uncomplicated: Secondary | ICD-10-CM | POA: Diagnosis not present

## 2018-06-09 DIAGNOSIS — F329 Major depressive disorder, single episode, unspecified: Secondary | ICD-10-CM | POA: Insufficient documentation

## 2018-06-09 DIAGNOSIS — I10 Essential (primary) hypertension: Secondary | ICD-10-CM | POA: Diagnosis not present

## 2018-06-09 DIAGNOSIS — R55 Syncope and collapse: Secondary | ICD-10-CM | POA: Diagnosis not present

## 2018-06-09 DIAGNOSIS — M4802 Spinal stenosis, cervical region: Secondary | ICD-10-CM | POA: Diagnosis not present

## 2018-06-09 DIAGNOSIS — M25511 Pain in right shoulder: Secondary | ICD-10-CM | POA: Diagnosis not present

## 2018-06-09 DIAGNOSIS — Z79899 Other long term (current) drug therapy: Secondary | ICD-10-CM | POA: Insufficient documentation

## 2018-06-09 DIAGNOSIS — F1911 Other psychoactive substance abuse, in remission: Secondary | ICD-10-CM | POA: Diagnosis present

## 2018-06-09 DIAGNOSIS — S4991XA Unspecified injury of right shoulder and upper arm, initial encounter: Secondary | ICD-10-CM | POA: Diagnosis not present

## 2018-06-09 DIAGNOSIS — R202 Paresthesia of skin: Secondary | ICD-10-CM

## 2018-06-09 DIAGNOSIS — Z8249 Family history of ischemic heart disease and other diseases of the circulatory system: Secondary | ICD-10-CM | POA: Diagnosis not present

## 2018-06-09 DIAGNOSIS — R42 Dizziness and giddiness: Secondary | ICD-10-CM | POA: Diagnosis not present

## 2018-06-09 DIAGNOSIS — F1910 Other psychoactive substance abuse, uncomplicated: Secondary | ICD-10-CM | POA: Diagnosis not present

## 2018-06-09 DIAGNOSIS — M542 Cervicalgia: Secondary | ICD-10-CM | POA: Diagnosis present

## 2018-06-09 LAB — BASIC METABOLIC PANEL
Anion gap: 12 (ref 5–15)
BUN: 11 mg/dL (ref 6–20)
CALCIUM: 9.2 mg/dL (ref 8.9–10.3)
CHLORIDE: 108 mmol/L (ref 98–111)
CO2: 21 mmol/L — AB (ref 22–32)
Creatinine, Ser: 1.37 mg/dL — ABNORMAL HIGH (ref 0.44–1.00)
GFR calc Af Amer: 51 mL/min — ABNORMAL LOW (ref >60)
GFR calc non Af Amer: 44 mL/min — ABNORMAL LOW (ref >60)
Glucose, Bld: 115 mg/dL — ABNORMAL HIGH (ref 70–99)
POTASSIUM: 3.9 mmol/L (ref 3.5–5.1)
Sodium: 141 mmol/L (ref 135–145)

## 2018-06-09 LAB — CBC
HCT: 44.9 % (ref 36.0–46.0)
Hemoglobin: 14.6 g/dL (ref 12.0–15.0)
MCH: 31.6 pg (ref 26.0–34.0)
MCHC: 32.5 g/dL (ref 30.0–36.0)
MCV: 97.2 fL (ref 80.0–100.0)
NRBC: 0 % (ref 0.0–0.2)
PLATELETS: 227 10*3/uL (ref 150–400)
RBC: 4.62 MIL/uL (ref 3.87–5.11)
RDW: 13.8 % (ref 11.5–15.5)
WBC: 12.6 10*3/uL — ABNORMAL HIGH (ref 4.0–10.5)

## 2018-06-09 LAB — I-STAT BETA HCG BLOOD, ED (MC, WL, AP ONLY): I-stat hCG, quantitative: 9.2 m[IU]/mL — ABNORMAL HIGH (ref <5)

## 2018-06-09 MED ORDER — SODIUM CHLORIDE 0.9% FLUSH
3.0000 mL | Freq: Once | INTRAVENOUS | Status: AC
  Administered 2018-06-10: 3 mL via INTRAVENOUS

## 2018-06-09 NOTE — ED Triage Notes (Signed)
Pt reports a witnessed syncopal episode today at 1500. States she was feeling hot and flushed prior to passing out, denies dizziness/chest pain/shortness of breath.

## 2018-06-10 ENCOUNTER — Emergency Department (HOSPITAL_COMMUNITY): Payer: BLUE CROSS/BLUE SHIELD

## 2018-06-10 ENCOUNTER — Observation Stay (HOSPITAL_BASED_OUTPATIENT_CLINIC_OR_DEPARTMENT_OTHER): Payer: BLUE CROSS/BLUE SHIELD

## 2018-06-10 ENCOUNTER — Other Ambulatory Visit: Payer: Self-pay

## 2018-06-10 DIAGNOSIS — Z79899 Other long term (current) drug therapy: Secondary | ICD-10-CM | POA: Diagnosis not present

## 2018-06-10 DIAGNOSIS — M25511 Pain in right shoulder: Secondary | ICD-10-CM | POA: Diagnosis not present

## 2018-06-10 DIAGNOSIS — R55 Syncope and collapse: Secondary | ICD-10-CM | POA: Diagnosis not present

## 2018-06-10 DIAGNOSIS — I10 Essential (primary) hypertension: Secondary | ICD-10-CM | POA: Diagnosis not present

## 2018-06-10 DIAGNOSIS — J4540 Moderate persistent asthma, uncomplicated: Secondary | ICD-10-CM | POA: Diagnosis not present

## 2018-06-10 DIAGNOSIS — R42 Dizziness and giddiness: Secondary | ICD-10-CM | POA: Diagnosis not present

## 2018-06-10 DIAGNOSIS — R9431 Abnormal electrocardiogram [ECG] [EKG]: Secondary | ICD-10-CM | POA: Diagnosis not present

## 2018-06-10 DIAGNOSIS — R200 Anesthesia of skin: Secondary | ICD-10-CM | POA: Diagnosis not present

## 2018-06-10 DIAGNOSIS — R202 Paresthesia of skin: Secondary | ICD-10-CM | POA: Diagnosis not present

## 2018-06-10 DIAGNOSIS — Z8249 Family history of ischemic heart disease and other diseases of the circulatory system: Secondary | ICD-10-CM | POA: Diagnosis not present

## 2018-06-10 DIAGNOSIS — M4802 Spinal stenosis, cervical region: Secondary | ICD-10-CM | POA: Diagnosis not present

## 2018-06-10 DIAGNOSIS — S199XXA Unspecified injury of neck, initial encounter: Secondary | ICD-10-CM | POA: Diagnosis not present

## 2018-06-10 DIAGNOSIS — F1910 Other psychoactive substance abuse, uncomplicated: Secondary | ICD-10-CM | POA: Diagnosis not present

## 2018-06-10 DIAGNOSIS — F329 Major depressive disorder, single episode, unspecified: Secondary | ICD-10-CM | POA: Diagnosis not present

## 2018-06-10 DIAGNOSIS — S4991XA Unspecified injury of right shoulder and upper arm, initial encounter: Secondary | ICD-10-CM | POA: Diagnosis not present

## 2018-06-10 DIAGNOSIS — F1410 Cocaine abuse, uncomplicated: Secondary | ICD-10-CM | POA: Diagnosis not present

## 2018-06-10 DIAGNOSIS — M542 Cervicalgia: Secondary | ICD-10-CM | POA: Diagnosis present

## 2018-06-10 DIAGNOSIS — F17210 Nicotine dependence, cigarettes, uncomplicated: Secondary | ICD-10-CM | POA: Diagnosis not present

## 2018-06-10 LAB — CBC
HCT: 44.8 % (ref 36.0–46.0)
Hemoglobin: 14.7 g/dL (ref 12.0–15.0)
MCH: 31.7 pg (ref 26.0–34.0)
MCHC: 32.8 g/dL (ref 30.0–36.0)
MCV: 96.8 fL (ref 80.0–100.0)
Platelets: 203 10*3/uL (ref 150–400)
RBC: 4.63 MIL/uL (ref 3.87–5.11)
RDW: 13.8 % (ref 11.5–15.5)
WBC: 12.4 10*3/uL — ABNORMAL HIGH (ref 4.0–10.5)
nRBC: 0 % (ref 0.0–0.2)

## 2018-06-10 LAB — BASIC METABOLIC PANEL
Anion gap: 10 (ref 5–15)
BUN: 10 mg/dL (ref 6–20)
CHLORIDE: 109 mmol/L (ref 98–111)
CO2: 21 mmol/L — ABNORMAL LOW (ref 22–32)
Calcium: 9.4 mg/dL (ref 8.9–10.3)
Creatinine, Ser: 1.21 mg/dL — ABNORMAL HIGH (ref 0.44–1.00)
GFR calc Af Amer: 60 mL/min — ABNORMAL LOW (ref >60)
GFR calc non Af Amer: 51 mL/min — ABNORMAL LOW (ref >60)
Glucose, Bld: 115 mg/dL — ABNORMAL HIGH (ref 70–99)
Potassium: 3.8 mmol/L (ref 3.5–5.1)
Sodium: 140 mmol/L (ref 135–145)

## 2018-06-10 LAB — TROPONIN I
Troponin I: <0.03 ng/mL (ref <0.03)
Troponin I: <0.03 ng/mL (ref <0.03)
Troponin I: <0.03 ng/mL (ref <0.03)

## 2018-06-10 LAB — RAPID URINE DRUG SCREEN, HOSP PERFORMED
Amphetamines: NOT DETECTED
BENZODIAZEPINES: NOT DETECTED
Barbiturates: NOT DETECTED
COCAINE: POSITIVE — AB
Opiates: NOT DETECTED
Tetrahydrocannabinol: POSITIVE — AB

## 2018-06-10 LAB — URINALYSIS, ROUTINE W REFLEX MICROSCOPIC
Bilirubin Urine: NEGATIVE
GLUCOSE, UA: NEGATIVE mg/dL
HGB URINE DIPSTICK: NEGATIVE
KETONES UR: NEGATIVE mg/dL
LEUKOCYTES UA: NEGATIVE
Nitrite: NEGATIVE
PH: 8 (ref 5.0–8.0)
Protein, ur: NEGATIVE mg/dL
Specific Gravity, Urine: 1.01 (ref 1.005–1.030)

## 2018-06-10 LAB — ECHOCARDIOGRAM COMPLETE
Height: 66 in
Weight: 2222.24 oz

## 2018-06-10 LAB — HIV ANTIBODY (ROUTINE TESTING W REFLEX): HIV SCREEN 4TH GENERATION: NONREACTIVE

## 2018-06-10 LAB — HCG, SERUM, QUALITATIVE: Preg, Serum: POSITIVE — AB

## 2018-06-10 LAB — GLUCOSE, CAPILLARY: GLUCOSE-CAPILLARY: 108 mg/dL — AB (ref 70–99)

## 2018-06-10 MED ORDER — FENTANYL CITRATE (PF) 100 MCG/2ML IJ SOLN
50.0000 ug | Freq: Once | INTRAMUSCULAR | Status: AC
  Administered 2018-06-10: 50 ug via INTRAVENOUS
  Filled 2018-06-10: qty 2

## 2018-06-10 MED ORDER — GABAPENTIN 100 MG PO CAPS
100.0000 mg | ORAL_CAPSULE | Freq: Three times a day (TID) | ORAL | Status: DC
  Administered 2018-06-10 (×3): 100 mg via ORAL
  Filled 2018-06-10 (×3): qty 1

## 2018-06-10 MED ORDER — SODIUM CHLORIDE 0.9% FLUSH
3.0000 mL | Freq: Two times a day (BID) | INTRAVENOUS | Status: DC
  Administered 2018-06-10 – 2018-06-11 (×2): 3 mL via INTRAVENOUS

## 2018-06-10 MED ORDER — SODIUM CHLORIDE 0.9% FLUSH
3.0000 mL | Freq: Two times a day (BID) | INTRAVENOUS | Status: DC
  Administered 2018-06-10 – 2018-06-11 (×3): 3 mL via INTRAVENOUS

## 2018-06-10 MED ORDER — DEXAMETHASONE 4 MG PO TABS
4.0000 mg | ORAL_TABLET | Freq: Four times a day (QID) | ORAL | Status: DC
  Administered 2018-06-10 – 2018-06-11 (×5): 4 mg via ORAL
  Filled 2018-06-10 (×5): qty 1

## 2018-06-10 MED ORDER — SODIUM CHLORIDE 0.9% FLUSH: 3.0000 mL | INTRAVENOUS | Status: DC | PRN

## 2018-06-10 MED ORDER — SODIUM CHLORIDE 0.9 % IV SOLN: 250.0000 mL | INTRAVENOUS | Status: DC | PRN

## 2018-06-10 MED ORDER — MORPHINE SULFATE (PF) 2 MG/ML IV SOLN
2.0000 mg | INTRAVENOUS | Status: DC | PRN
  Administered 2018-06-10: 2 mg via INTRAVENOUS
  Filled 2018-06-10: qty 1

## 2018-06-10 MED ORDER — DEXAMETHASONE 4 MG PO TABS
4.0000 mg | ORAL_TABLET | Freq: Two times a day (BID) | ORAL | Status: DC
  Administered 2018-06-10: 4 mg via ORAL
  Filled 2018-06-10: qty 1

## 2018-06-10 MED ORDER — ONDANSETRON HCL 4 MG/2ML IJ SOLN
4.0000 mg | Freq: Four times a day (QID) | INTRAMUSCULAR | Status: DC | PRN
  Administered 2018-06-10: 4 mg via INTRAVENOUS
  Filled 2018-06-10: qty 2

## 2018-06-10 MED ORDER — AMLODIPINE BESYLATE 10 MG PO TABS
10.0000 mg | ORAL_TABLET | Freq: Every day | ORAL | Status: DC
  Administered 2018-06-10 – 2018-06-11 (×2): 10 mg via ORAL
  Filled 2018-06-10 (×2): qty 1

## 2018-06-10 MED ORDER — HYDRALAZINE HCL 20 MG/ML IJ SOLN
20.0000 mg | Freq: Four times a day (QID) | INTRAMUSCULAR | Status: DC | PRN
  Administered 2018-06-10 (×3): 20 mg via INTRAVENOUS
  Filled 2018-06-10 (×3): qty 1

## 2018-06-10 MED ORDER — SODIUM CHLORIDE 0.9 % IV BOLUS
1000.0000 mL | Freq: Once | INTRAVENOUS | Status: AC
  Administered 2018-06-10: 1000 mL via INTRAVENOUS

## 2018-06-10 NOTE — ED Notes (Signed)
Attempted to call report-no answer 

## 2018-06-10 NOTE — Progress Notes (Signed)
Patient c/o nausea/vomiting. Dr Eliseo Squires notified. New orders received for amlodipine for hypertension and zofran prn n/v. Both medicines given and explained to patient.

## 2018-06-10 NOTE — ED Provider Notes (Signed)
[facility] EMERGENCY DEPARTMENT Provider Note   CSN: [id number] Arrival date & time: 06/09/18  2151     History   Chief Complaint Chief Complaint  Patient presents with  . Loss of Consciousness    HPI [name] is a 53 y.o. female.  Patient presents after syncopal episode.  States she was walking to her bedroom when she had a "hot flash" and felt flushed and then does not know what happened next.  This was unwitnessed.  She apparently fell forward striking her head and right shoulder.  She denies any preceding chest pain or shortness of breath. THis occurred shortly after smoking marijuana.  Since the fall she has had diffuse pain in her bilateral arms with numbness and tingling.  Denies any pain in her arms or neck before this.  She has pain in her neck that radiates down both sides with numbness and tingling in her arms as well as pain.  Denies any weakness or numbness in her legs.  No bowel or bladder incontinence.  She has never passed out in the past.  She admits to using marijuana today but denies any other drugs.  Denies any cardiac history.  The history is provided by the patient.  Loss of Consciousness  Associated symptoms: dizziness, headaches and weakness   Associated symptoms: no chest pain, no fever, no nausea, no palpitations, no shortness of breath and no vomiting     Past Medical History:  Diagnosis Date  . Adnexal mass 2006   Bilateral ovarian cystic masses- recomended GYN FU.   Marland Kitchen Alcohol abuse    Hx of, quit in 2009  . Ankle fracture    Bimalleolar sp closed reduction under floroscopy.   . Bronchial asthma   . Depression    Follows with [facility], history of [facility] admission to [facility].  History of suisidal ideation with drug od (50 pills of ibuprofen).   . History of cocaine abuse ([place])    Quit in 2009  . Marijuana abuse    Hx of, quit in 2009  . Menorrhagia 2006   Endometiral Biopsy- DEGENERATING SECRETORY-TYPE ENDOMETRIUM  . Migraine  headache   . Normocytic anemia   . Tobacco abuse   . Transaminitis    Considered to be secondary to alchol use.     Patient Active Problem List   Diagnosis Date Noted  . Screen for STD (sexually transmitted disease) 05/07/2016  . Abdominal pain 05/07/2016  . Encounter for health maintenance examination in adult 07/26/2015  . History of migraine headaches 07/26/2015  . Tobacco use 07/26/2015  . Vitamin D deficiency 07/26/2015  . Impaired fasting blood sugar 07/26/2015  . Family history of colon cancer 07/26/2015  . Vaccine counseling 07/26/2015  . History of substance abuse ([place]) 07/26/2015  . Asthma, moderate persistent 07/26/2015  . Dyspnea 07/26/2015    Past Surgical History:  Procedure Laterality Date  . [place] CYST EXCISION  2015  . BILATERAL SALPINGECTOMY Bilateral 07/18/2014   Procedure: BILATERAL SALPINGECTOMY;  Surgeon: Luz Lex, MD;  Location: [facility];  Service: Gynecology;  Laterality: Bilateral;  . Close reduction of bimalleolar ankle fracture     left  . LAPAROSCOPIC APPENDECTOMY  07/10/2011   Procedure: APPENDECTOMY LAPAROSCOPIC;  Surgeon: Rolm Bookbinder, MD;  Location: [facility];  Service: General;  Laterality: N/A;  . LAPAROSCOPY N/A 07/18/2014   Procedure: LAPAROSCOPY OPERATIVE WITH ENDOCATCH;  Surgeon: Luz Lex, MD;  Location: [facility];  Service: Gynecology;  Laterality: N/A;  .  LYSIS OF ADHESION N/A 07/18/2014   Procedure: LYSIS OF ADHESION;  Surgeon: Luz Lex, MD;  Location: [facility];  Service: Gynecology;  Laterality: N/A;     OB History   No obstetric history on file.      Home Medications    Prior to Admission medications   Medication Sig Start Date End Date Taking? Authorizing Provider  albuterol (PROAIR HFA) 108 (90 Base) MCG/ACT inhaler Inhale 2 puffs into the lungs every 6 (six) hours as needed for wheezing or shortness of breath. 07/26/15   Tysinger, Camelia Eng, PA-C  budesonide-formoterol (SYMBICORT) 160-4.5 MCG/ACT inhaler Inhale 2  puffs into the lungs 2 (two) times daily. 07/09/15   Tysinger, Camelia Eng, PA-C  Cholecalciferol (VITAMIN D) 2000 UNITS tablet Take 1 tablet (2,000 Units total) by mouth daily. 11/09/14   Tysinger, Camelia Eng, PA-C  ciprofloxacin (CIPRO) 500 MG tablet Take 1 tablet (500 mg total) by mouth 2 (two) times daily. 05/07/16   Tysinger, Camelia Eng, PA-C  diclofenac (VOLTAREN) 75 MG EC tablet Take 1 tablet (75 mg total) by mouth 2 (two) times daily. 11/28/16   Tysinger, Camelia Eng, PA-C  diclofenac sodium (VOLTAREN) 1 % GEL Apply 4 g topically 4 (four) times daily. 04/02/18   Joy, Shawn C, PA-C  estradiol (ESTRACE) 2 MG tablet Take 2 mg by mouth daily. Reported on 07/26/2015 08/10/14   [provider]  fluticasone (CUTIVATE) 0.05 % cream Apply topically 2 (two) times daily. 02/01/14   Billy Fischer, MD  HYDROcodone-acetaminophen (NORCO/VICODIN) 5-325 MG tablet Take 1 tablet by mouth every 6 (six) hours as needed for severe pain. 04/02/18   Joy, Shawn C, PA-C  lidocaine (LIDODERM) 5 % Place 1 patch onto the skin daily. Remove & Discard patch within 12 hours or as directed by MD 04/02/18   Lorayne Bender, PA-C  linaclotide (LINZESS) 145 MCG CAPS capsule Take 1 capsule (145 mcg total) by mouth daily before breakfast. 12/19/15   Nandigam, Venia Minks, MD  methocarbamol (ROBAXIN) 500 MG tablet Take 1 tablet (500 mg total) by mouth 2 (two) times daily. 04/02/18   Joy, Shawn C, PA-C  omeprazole (PRILOSEC) 40 MG capsule Take 1 capsule (40 mg total) by mouth daily. Patient not taking: Reported on 05/07/2016 03/25/16   Tysinger, Camelia Eng, PA-C  oxyCODONE-acetaminophen (PERCOCET) 5-325 MG tablet Take 1 tablet by mouth every 4 (four) hours as needed for severe pain. Patient not taking: Reported on 05/07/2016 12/04/15   Tysinger, Camelia Eng, PA-C  polyethylene glycol powder (GLYCOLAX/MIRALAX) powder Take 255 g by mouth daily. Patient not taking: Reported on 05/07/2016 12/04/15   Tysinger, Camelia Eng, PA-C  progesterone (PROMETRIUM) 100 MG capsule  Reported on 12/04/2015 10/10/14   [provider]  promethazine (PHENERGAN) 25 MG tablet Take 1 tablet (25 mg total) by mouth every 8 (eight) hours as needed for nausea or vomiting. Patient not taking: Reported on 05/07/2016 03/18/16   Tysinger, Camelia Eng, PA-C  Vitamin D, Ergocalciferol, (DRISDOL) 50000 units CAPS capsule TAKE 1 CAPSULE (50,000 UNITS TOTAL) BY MOUTH EVERY 7 (SEVEN) DAYS. 05/16/16   Tysinger, Camelia Eng, PA-C  zonisamide (ZONEGRAN) 100 MG capsule Take 200 mg by mouth daily. 06/20/14   [provider]    Family History Family History  Problem Relation Age of Onset  . Diabetes Mother   . Hypertension Mother   . Colon cancer Mother 98  . Hypertension Father   . Stroke Father   . Diabetes Brother   . Obesity Brother   .  Hypertension Brother   . Heart disease Neg Hx     Social History Social History   Tobacco Use  . Smoking status: Current Every Day Smoker    Packs/day: 0.50    Years: 10.00    Pack years: 5.00    Types: Cigarettes  . Smokeless tobacco: Never Used  Substance Use Topics  . Alcohol use: No    Comment: .   Marland Kitchen Drug use: Yes    Types: Marijuana    Comment: Quit Coccaine and marijuana in 2009; denies     Allergies   Patient has no known allergies.   Review of Systems Review of Systems  Constitutional: Negative for activity change, appetite change, fatigue and fever.  HENT: Negative for congestion and rhinorrhea.   Respiratory: Negative for cough, chest tightness and shortness of breath.   Cardiovascular: Positive for syncope. Negative for chest pain and palpitations.  Gastrointestinal: Negative for abdominal pain, nausea and vomiting.  Genitourinary: Negative for dysuria, hematuria, vaginal bleeding and vaginal discharge.  Musculoskeletal: Positive for arthralgias, myalgias and neck pain.  Neurological: Positive for dizziness, syncope, weakness, numbness and headaches.   all other systems are negative except as noted in the HPI and  PMH.     Physical Exam Updated Vital Signs BP (!) 147/102 (BP Location: Right Arm)   Pulse 75   Temp 97.6 F (36.4 C) (Oral)   Resp 20   LMP 12/24/2012   SpO2 98%   Physical Exam Vitals signs and nursing note reviewed.  Constitutional:      General: She is not in acute distress.    Appearance: She is well-developed.     Comments: Very anxious appearing  HENT:     Head: Normocephalic and atraumatic.     Mouth/Throat:     Pharynx: No oropharyngeal exudate.  Eyes:     Conjunctiva/sclera: Conjunctivae normal.     Pupils: Pupils are equal, round, and reactive to light.  Neck:     Musculoskeletal: Normal range of motion and neck supple.     Comments: Patient with diffuse paraspinal and midline C-spine tenderness without step-off.  Placed in c-collar on my initial evaluation Cardiovascular:     Rate and Rhythm: Normal rate and regular rhythm.     Heart sounds: Normal heart sounds. No murmur.  Pulmonary:     Effort: Pulmonary effort is normal. No respiratory distress.     Breath sounds: Normal breath sounds.  Abdominal:     Palpations: Abdomen is soft.     Tenderness: There is no abdominal tenderness. There is no guarding or rebound.  Musculoskeletal: Normal range of motion.        General: Tenderness present.     Comments: There is a slight abrasion over the right shoulder without bony tenderness.  Patient is able to raise arms against gravity bilaterally.  Equal grip strength and radial pulses.  Patient complains of paresthesias involving her entire right and left arm.  She yells in pain when any area of either arm is palpated.  5/5 strength of lower extremities bilaterally  Skin:    General: Skin is warm.     Capillary Refill: Capillary refill takes less than 2 seconds.     Findings: No rash.  Neurological:     Mental Status: She is alert and oriented to person, place, and time.     Cranial Nerves: No cranial nerve deficit.     Motor: No abnormal muscle tone.      Coordination: Coordination normal.  Comments:  5/5 strength throughout. CN 2-12 intact.Equal grip strength.   Psychiatric:        Behavior: Behavior normal.      ED Treatments / Results  Labs (all labs ordered are listed, but only abnormal results are displayed) Labs Reviewed  BASIC METABOLIC PANEL - Abnormal; Notable for the following components:      Result Value   CO2 21 (*)    Glucose, Bld 115 (*)    Creatinine, Ser 1.37 (*)    GFR calc non Af Amer 44 (*)    GFR calc Af Amer 51 (*)    All other components within normal limits  CBC - Abnormal; Notable for the following components:   WBC 12.6 (*)    All other components within normal limits  URINALYSIS, ROUTINE W REFLEX MICROSCOPIC - Abnormal; Notable for the following components:   APPearance CLOUDY (*)    All other components within normal limits  RAPID URINE DRUG SCREEN, HOSP PERFORMED - Abnormal; Notable for the following components:   Cocaine POSITIVE (*)    Tetrahydrocannabinol POSITIVE (*)    All other components within normal limits  BASIC METABOLIC PANEL - Abnormal; Notable for the following components:   CO2 21 (*)    Glucose, Bld 115 (*)    Creatinine, Ser 1.21 (*)    GFR calc non Af Amer 51 (*)    GFR calc Af Amer 60 (*)    All other components within normal limits  CBC - Abnormal; Notable for the following components:   WBC 12.4 (*)    All other components within normal limits  HCG, SERUM, QUALITATIVE - Abnormal; Notable for the following components:   Preg, Serum POSITIVE (*)    All other components within normal limits  GLUCOSE, CAPILLARY - Abnormal; Notable for the following components:   Glucose-Capillary 108 (*)    All other components within normal limits  I-STAT BETA HCG BLOOD, ED ([facility], [facility], [facility] ONLY) - Abnormal; Notable for the following components:   I-stat hCG, quantitative 9.2 (*)    All other components within normal limits  TROPONIN I  HIV ANTIBODY (ROUTINE TESTING W REFLEX)  TROPONIN I    TROPONIN I    EKG EKG Interpretation  Date/Time:  Wednesday June 09 2018 21:59:36 EST Ventricular Rate:  80 PR Interval:  158 QRS Duration: 66 QT Interval:  384 QTC Calculation: 442 R Axis:   67 Text Interpretation:  Normal sinus rhythm Possible Left atrial enlargement Borderline ECG No significant change was found Confirmed by Ezequiel Essex [phone]) on 06/10/2018 12:52:00 AM   Radiology Dg Chest 2 View  Result Date: 06/10/2018 CLINICAL DATA:  Syncopal episode today. Dizziness, chest pain, and shortness of breath. Hot and flushed feeling. EXAM: CHEST - 2 VIEW COMPARISON:  08/02/2014 FINDINGS: The heart size and mediastinal contours are within normal limits. Both lungs are clear. The visualized skeletal structures are unremarkable. IMPRESSION: No active cardiopulmonary disease. Electronically Signed   By: Lucienne Capers M.D.   On: 06/10/2018 01:20   Dg Shoulder Right  Result Date: 06/10/2018 CLINICAL DATA:  Witnessed syncopal episode today. Shock feeling in the right shoulder and arm since the fall. EXAM: RIGHT SHOULDER - 2+ VIEW COMPARISON:  None. FINDINGS: There is no evidence of fracture or dislocation. There is no evidence of arthropathy or other focal bone abnormality. Soft tissues are unremarkable. IMPRESSION: Negative. Electronically Signed   By: Lucienne Capers M.D.   On: 06/10/2018 01:21   Ct Head Wo Contrast  Result Date: 06/10/2018 CLINICAL DATA:  Syncope EXAM: CT HEAD WITHOUT CONTRAST CT CERVICAL SPINE WITHOUT CONTRAST TECHNIQUE: Multidetector CT imaging of the head and cervical spine was performed following the standard protocol without intravenous contrast. Multiplanar CT image reconstructions of the cervical spine were also generated. COMPARISON:  MRI brain dated 08/17/2013 FINDINGS: CT HEAD FINDINGS Brain: No evidence of acute infarction, hemorrhage, hydrocephalus, extra-axial collection or mass lesion/mass effect. Vascular: No hyperdense vessel or unexpected  calcification. Skull: Normal. Negative for fracture or focal lesion. Sinuses/Orbits: The visualized paranasal sinuses are essentially clear. The mastoid air cells are unopacified. Other: None. CT CERVICAL SPINE FINDINGS Alignment: Normal. Skull base and vertebrae: No acute fracture. No primary bone lesion or focal pathologic process. Soft tissues and spinal canal: No prevertebral fluid or swelling. No visible canal hematoma. Disc levels: Mild degenerative changes of the mid cervical spine. Spinal canal is patent. Upper chest: Visualized lung apices are clear. Other: Visualized thyroid is unremarkable. IMPRESSION: Normal head CT. No evidence of traumatic injury to the cervical spine. Mild degenerative changes. Electronically Signed   By: Julian Hy M.D.   On: 06/10/2018 01:48   Ct Cervical Spine Wo Contrast  Result Date: 06/10/2018 CLINICAL DATA:  Syncope EXAM: CT HEAD WITHOUT CONTRAST CT CERVICAL SPINE WITHOUT CONTRAST TECHNIQUE: Multidetector CT imaging of the head and cervical spine was performed following the standard protocol without intravenous contrast. Multiplanar CT image reconstructions of the cervical spine were also generated. COMPARISON:  MRI brain dated 08/17/2013 FINDINGS: CT HEAD FINDINGS Brain: No evidence of acute infarction, hemorrhage, hydrocephalus, extra-axial collection or mass lesion/mass effect. Vascular: No hyperdense vessel or unexpected calcification. Skull: Normal. Negative for fracture or focal lesion. Sinuses/Orbits: The visualized paranasal sinuses are essentially clear. The mastoid air cells are unopacified. Other: None. CT CERVICAL SPINE FINDINGS Alignment: Normal. Skull base and vertebrae: No acute fracture. No primary bone lesion or focal pathologic process. Soft tissues and spinal canal: No prevertebral fluid or swelling. No visible canal hematoma. Disc levels: Mild degenerative changes of the mid cervical spine. Spinal canal is patent. Upper chest: Visualized lung  apices are clear. Other: Visualized thyroid is unremarkable. IMPRESSION: Normal head CT. No evidence of traumatic injury to the cervical spine. Mild degenerative changes. Electronically Signed   By: Julian Hy M.D.   On: 06/10/2018 01:48   Mr Cervical Spine Wo Contrast  Result Date: 06/10/2018 CLINICAL DATA:  Fall.  Bilateral upper extremity tingling. EXAM: MRI CERVICAL SPINE WITHOUT CONTRAST TECHNIQUE: Multiplanar, multisequence MR imaging of the cervical spine was performed. No intravenous contrast was administered. COMPARISON:  None. FINDINGS: Alignment: Grade 1 anterolisthesis at C3-4 Vertebrae: There is right C2-3 facet edema. No compression fracture. Cord: Normal caliber and signal. Posterior Fossa, vertebral arteries, paraspinal tissues: Visualized posterior fossa is normal. Vertebral artery flow voids are preserved. No prevertebral effusion. Disc levels: Sagittal imaging includes the atlantoaxial joint to the level of the T3-4 disc space, with axial imaging of the disc spaces from C2-3 to C7-T1. The craniocervical junction is normal. C2-3: There is right facet hypertrophy with moderate right foraminal stenosis. No spinal canal stenosis. C3-4: There is bilateral uncovertebral and facet hypertrophy with severe right and moderate left foraminal stenosis. No central spinal canal stenosis. C4-5: Disc space narrowing with anterior osteophyte. Small disc bulge with bilateral uncovertebral hypertrophy with moderate right and severe left neural foraminal stenosis. No spinal canal stenosis. C5-6: Left-greater-than-right uncovertebral hypertrophy with severe bilateral, left-greater-than-right neural foraminal stenosis. C6-7: Small disc bulge without spinal canal or neural foraminal stenosis.  C7-T1: No spinal canal or neural foraminal stenosis. No disc herniation. T1-T4: No disc herniation. No spinal canal or neural foraminal stenosis. IMPRESSION: 1. No acute abnormality. 2. 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 3. Edema of the right C2-3 facet could serve as a source of local neck pain. Electronically Signed   By: Ulyses Jarred M.D.   On: 06/10/2018 03:56    Procedures Procedures (including critical care time)  Medications Ordered in ED Medications  sodium chloride flush (NS) 0.9 % injection 3 mL (has no administration in time range)     Initial Impression / Assessment and Plan / ED Course  I have reviewed the triage vital signs and the nursing notes.  Pertinent labs & imaging results that were available during my care of the patient were reviewed by me and considered in my medical decision making (see chart for details).    Apparent syncopal episode, likely vasovagal.  Preceded by dizziness and lightheadedness.  Also smoked marijuana today.  EKG without Brugada or prolonged QT.  Concern for possible cervical spine injury with arm numbness and tingling.  Placed in c-collar.  Will obtain CT head and C-spine.  CT head and C-spine negative for fracture.  No intracranial pathology.  Discussed with Dr. Malen Gauze of neurology.  He agrees with MRI C-spine to assess further given her sensory changes and pain in her upper extremities.  Does not feel she needs contrast or any thoracic imaging.  MRI pending. Plan admission given syncope with minimal prodrome. No arrhythmias on monitor. UDS positive for cocaine and marijuana.  D/w Dr. Shanon Brow.  Final Clinical Impressions(s) / ED Diagnoses   Final diagnoses:  Syncope and collapse  Paresthesia of upper extremity    ED Discharge Orders    None       Enza Shone, Annie Main, MD 06/10/18 [id number]

## 2018-06-10 NOTE — H&P (Signed)
History and Physical    [name] GBT:[id number] DOB: [birth year] DOA: 06/09/2018  PCP: Patient, No Pcp Per  Patient coming from: Home  Chief Complaint: Passed out  HPI: [name] is a 53 y.o. female with medical history significant of polysubstance abuse comes in after smoking a joint which she personally rolled herself she denies it was laced with anything but also has a history of cocaine abuse soon thereafter started having a hot flash got dizzy and passed out.  She is injured her shoulder particularly the right shoulder.  Particularly normal state of health prior to this.  She is now having numbness and tingling down both arms.  Her CT of her head was unrevealing along with her neck.  Neurology was called who are recommending MRI of her neck to rule out spinal cord injury.  Patient denies any chest pain or shortness of breath.  Patient can move her arms.  Patient be referred for admission for syncope and possible neck injury.  Review of Systems: As per HPI otherwise 10 point review of systems negative.   Past Medical History:  Diagnosis Date  . Adnexal mass 2006   Bilateral ovarian cystic masses- recomended GYN FU.   Marland Kitchen Alcohol abuse    Hx of, quit in 2009  . Ankle fracture    Bimalleolar sp closed reduction under floroscopy.   . Bronchial asthma   . Depression    Follows with [facility], history of voluntary admission to [facility].  History of suisidal ideation with drug od (50 pills of ibuprofen).   . History of cocaine abuse ([place])    Quit in 2009  . Marijuana abuse    Hx of, quit in 2009  . Menorrhagia 2006   Endometiral Biopsy- DEGENERATING SECRETORY-TYPE ENDOMETRIUM  . Migraine headache   . Normocytic anemia   . Tobacco abuse   . Transaminitis    Considered to be secondary to alchol use.     Past Surgical History:  Procedure Laterality Date  . [place]ound CYST EXCISION  2015  . BILATERAL SALPINGECTOMY Bilateral 07/18/2014   Procedure: BILATERAL SALPINGECTOMY;  Surgeon:  Luz Lex, MD;  Location: [facility];  Service: Gynecology;  Laterality: Bilateral;  . Close reduction of bimalleolar ankle fracture     left  . LAPAROSCOPIC APPENDECTOMY  07/10/2011   Procedure: APPENDECTOMY LAPAROSCOPIC;  Surgeon: Rolm Bookbinder, MD;  Location: [facility];  Service: General;  Laterality: N/A;  . LAPAROSCOPY N/A 07/18/2014   Procedure: LAPAROSCOPY OPERATIVE WITH ENDOCATCH;  Surgeon: Luz Lex, MD;  Location: [facility];  Service: Gynecology;  Laterality: N/A;  . LYSIS OF ADHESION N/A 07/18/2014   Procedure: LYSIS OF ADHESION;  Surgeon: Luz Lex, MD;  Location: [facility];  Service: Gynecology;  Laterality: N/A;     reports that she has been smoking cigarettes. She has a 5.00 pack-year smoking history. She has never used smokeless tobacco. She reports current drug use. Drug: Marijuana. She reports that she does not drink alcohol.  No Known Allergies  Family History  Problem Relation Age of Onset  . Diabetes Mother   . Hypertension Mother   . Colon cancer Mother 30  . Hypertension Father   . Stroke Father   . Diabetes Brother   . Obesity Brother   . Hypertension Brother   . Heart disease Neg Hx     Prior to Admission medications   Medication Sig Start Date End Date Taking? Authorizing Provider  zonisamide (ZONEGRAN) 100 MG capsule Take  200 mg by mouth daily. 06/20/14  Yes [provider]  albuterol (PROAIR HFA) 108 (90 Base) MCG/ACT inhaler Inhale 2 puffs into the lungs every 6 (six) hours as needed for wheezing or shortness of breath. Patient not taking: Reported on 06/10/2018 07/26/15   Tysinger, Camelia Eng, PA-C  budesonide-formoterol [facility]) 160-4.5 MCG/ACT inhaler Inhale 2 puffs into the lungs 2 (two) times daily. Patient not taking: Reported on 06/10/2018 07/09/15   Tysinger, Camelia Eng, PA-C  Cholecalciferol (VITAMIN D) 2000 UNITS tablet Take 1 tablet (2,000 Units total) by mouth daily. Patient not taking: Reported on 06/10/2018 11/09/14   Tysinger, Camelia Eng, PA-C    ciprofloxacin (CIPRO) 500 MG tablet Take 1 tablet (500 mg total) by mouth 2 (two) times daily. Patient not taking: Reported on 06/10/2018 05/07/16   Tysinger, Camelia Eng, PA-C  diclofenac (VOLTAREN) 75 MG EC tablet Take 1 tablet (75 mg total) by mouth 2 (two) times daily. Patient not taking: Reported on 06/10/2018 11/28/16   Tysinger, Camelia Eng, PA-C  diclofenac sodium (VOLTAREN) 1 % GEL Apply 4 g topically 4 (four) times daily. Patient not taking: Reported on 06/10/2018 04/02/18   Joy, Shawn C, PA-C  fluticasone (CUTIVATE) 0.05 % cream Apply topically 2 (two) times daily. Patient not taking: Reported on 06/10/2018 02/01/14   Billy Fischer, MD  HYDROcodone-acetaminophen (NORCO/VICODIN) 5-325 MG tablet Take 1 tablet by mouth every 6 (six) hours as needed for severe pain. Patient not taking: Reported on 06/10/2018 04/02/18   Joy, Shawn C, PA-C  lidocaine (LIDODERM) 5 % Place 1 patch onto the skin daily. Remove & Discard patch within 12 hours or as directed by MD Patient not taking: Reported on 06/10/2018 04/02/18   Lorayne Bender, PA-C  linaclotide [facility]) 145 MCG CAPS capsule Take 1 capsule (145 mcg total) by mouth daily before breakfast. Patient not taking: Reported on 06/10/2018 12/19/15   Mauri Pole, MD  methocarbamol (ROBAXIN) 500 MG tablet Take 1 tablet (500 mg total) by mouth 2 (two) times daily. Patient not taking: Reported on 06/10/2018 04/02/18   Lorayne Bender, PA-C  omeprazole (PRILOSEC) 40 MG capsule Take 1 capsule (40 mg total) by mouth daily. Patient not taking: Reported on 05/07/2016 03/25/16   Tysinger, Camelia Eng, PA-C  oxyCODONE-acetaminophen (PERCOCET) 5-325 MG tablet Take 1 tablet by mouth every 4 (four) hours as needed for severe pain. Patient not taking: Reported on 05/07/2016 12/04/15   Tysinger, Camelia Eng, PA-C  polyethylene glycol powder (GLYCOLAX/MIRALAX) powder Take 255 g by mouth daily. Patient not taking: Reported on 05/07/2016 12/04/15   Tysinger, Camelia Eng, PA-C  promethazine  (PHENERGAN) 25 MG tablet Take 1 tablet (25 mg total) by mouth every 8 (eight) hours as needed for nausea or vomiting. Patient not taking: Reported on 05/07/2016 03/18/16   Tysinger, Camelia Eng, PA-C  Vitamin D, Ergocalciferol, (DRISDOL) 50000 units CAPS capsule TAKE 1 CAPSULE (50,000 UNITS TOTAL) BY MOUTH EVERY 7 (SEVEN) DAYS. Patient not taking: Reported on 06/10/2018 05/16/16   Carlena Hurl, PA-C    Physical Exam: Vitals:   06/09/18 2155 06/10/18 0009 06/10/18 0300  BP: (!) 154/102 (!) 147/102 (!) 188/103  Pulse: 81 75 66  Resp: (!) 22 20 (!) 22  Temp: 98.1 F (36.7 C) 97.6 F (36.4 C)   TempSrc: Oral Oral   SpO2: 98% 98% 96%      Constitutional: NAD, calm, comfortable Vitals:   06/09/18 2155 06/10/18 0009 06/10/18 0300  BP: (!) 154/102 (!) 147/102 (!) 188/103  Pulse:  81 75 66  Resp: (!) 22 20 (!) 22  Temp: 98.1 F (36.7 C) 97.6 F (36.4 C)   TempSrc: Oral Oral   SpO2: 98% 98% 96%   Eyes: PERRL, lids and conjunctivae normal ENMT: Mucous membranes are moist. Posterior pharynx clear of any exudate or lesions.Normal dentition.  Neck: normal, supple, no masses, no thyromegaly Respiratory: clear to auscultation bilaterally, no wheezing, no crackles. Normal respiratory effort. No accessory muscle use.  Cardiovascular: Regular rate and rhythm, no murmurs / rubs / gallops. No extremity edema. 2+ pedal pulses. No carotid bruits.  Abdomen: no tenderness, no masses palpated. No hepatosplenomegaly. Bowel sounds positive.  Musculoskeletal: no clubbing / cyanosis. No joint deformity upper and lower extremities. Good ROM, no contractures. Normal muscle tone.  Skin: no rashes, lesions, ulcers. No induration Neurologic: CN 2-12 grossly intact. Sensation intact, DTR normal. Strength 5/5 in all 4.  Psychiatric: Normal judgment and insight. Alert and oriented x 3. Normal mood.    Labs on Admission: I have personally reviewed following labs and imaging studies  CBC: Recent Labs  Lab  06/09/18 2205  WBC 12.6*  HGB 14.6  HCT 44.9  MCV 97.2  PLT 656   Basic Metabolic Panel: Recent Labs  Lab 06/09/18 2205  NA 141  K 3.9  CL 108  CO2 21*  GLUCOSE 115*  BUN 11  CREATININE 1.37*  CALCIUM 9.2   GFR: CrCl cannot be calculated (Unknown ideal weight.). Liver Function Tests: No results for input(s): AST, ALT, ALKPHOS, BILITOT, PROT, ALBUMIN in the last 168 hours. No results for input(s): LIPASE, AMYLASE in the last 168 hours. No results for input(s): AMMONIA in the last 168 hours. Coagulation Profile: No results for input(s): INR, PROTIME in the last 168 hours. Cardiac Enzymes: No results for input(s): CKTOTAL, CKMB, CKMBINDEX, TROPONINI in the last 168 hours. BNP (last 3 results) No results for input(s): PROBNP in the last 8760 hours. HbA1C: No results for input(s): HGBA1C in the last 72 hours. CBG: No results for input(s): GLUCAP in the last 168 hours. Lipid Profile: No results for input(s): CHOL, HDL, LDLCALC, TRIG, CHOLHDL, LDLDIRECT in the last 72 hours. Thyroid Function Tests: No results for input(s): TSH, T4TOTAL, FREET4, T3FREE, THYROIDAB in the last 72 hours. Anemia Panel: No results for input(s): VITAMINB12, FOLATE, FERRITIN, TIBC, IRON, RETICCTPCT in the last 72 hours. Urine analysis:    Component Value Date/Time   COLORURINE YELLOW 04/01/2018 2236   APPEARANCEUR CLEAR 04/01/2018 2236   LABSPEC 1.021 04/01/2018 2236   PHURINE 5.0 04/01/2018 2236   GLUCOSEU NEGATIVE 04/01/2018 2236   GLUCOSEU NEG mg/dL 01/05/2009 2244   HGBUR NEGATIVE 04/01/2018 2236   HGBUR negative 11/28/2008 0844   BILIRUBINUR NEGATIVE 04/01/2018 2236   BILIRUBINUR neg 05/07/2016 1433   KETONESUR NEGATIVE 04/01/2018 2236   PROTEINUR NEGATIVE 04/01/2018 2236   UROBILINOGEN negative 05/07/2016 1433   UROBILINOGEN 0.2 07/08/2014 0025   NITRITE NEGATIVE 04/01/2018 2236   LEUKOCYTESUR NEGATIVE 04/01/2018 2236   Sepsis Labs:  !!!!!!!!!!!!!!!!!!!!!!!!!!!!!!!!!!!!!!!!!!!! @LABRCNTIP (procalcitonin:4,lacticidven:4) )No results found for this or any previous visit (from the past 240 hour(s)).   Radiological Exams on Admission: Dg Chest 2 View  Result Date: 06/10/2018 CLINICAL DATA:  Syncopal episode today. Dizziness, chest pain, and shortness of breath. Hot and flushed feeling. EXAM: CHEST - 2 VIEW COMPARISON:  08/02/2014 FINDINGS: The heart size and mediastinal contours are within normal limits. Both lungs are clear. The visualized skeletal structures are unremarkable. IMPRESSION: No active cardiopulmonary disease. Electronically Signed   By: Gwyndolyn Saxon  Gerilyn Nestle M.D.   On: 06/10/2018 01:20   Dg Shoulder Right  Result Date: 06/10/2018 CLINICAL DATA:  Witnessed syncopal episode today. Shock feeling in the right shoulder and arm since the fall. EXAM: RIGHT SHOULDER - 2+ VIEW COMPARISON:  None. FINDINGS: There is no evidence of fracture or dislocation. There is no evidence of arthropathy or other focal bone abnormality. Soft tissues are unremarkable. IMPRESSION: Negative. Electronically Signed   By: Lucienne Capers M.D.   On: 06/10/2018 01:21   Ct Head Wo Contrast  Result Date: 06/10/2018 CLINICAL DATA:  Syncope EXAM: CT HEAD WITHOUT CONTRAST CT CERVICAL SPINE WITHOUT CONTRAST TECHNIQUE: Multidetector CT imaging of the head and cervical spine was performed following the standard protocol without intravenous contrast. Multiplanar CT image reconstructions of the cervical spine were also generated. COMPARISON:  MRI brain dated 08/17/2013 FINDINGS: CT HEAD FINDINGS Brain: No evidence of acute infarction, hemorrhage, hydrocephalus, extra-axial collection or mass lesion/mass effect. Vascular: No hyperdense vessel or unexpected calcification. Skull: Normal. Negative for fracture or focal lesion. Sinuses/Orbits: The visualized paranasal sinuses are essentially clear. The mastoid air cells are unopacified. Other: None. CT CERVICAL SPINE  FINDINGS Alignment: Normal. Skull base and vertebrae: No acute fracture. No primary bone lesion or focal pathologic process. Soft tissues and spinal canal: No prevertebral fluid or swelling. No visible canal hematoma. Disc levels: Mild degenerative changes of the mid cervical spine. Spinal canal is patent. Upper chest: Visualized lung apices are clear. Other: Visualized thyroid is unremarkable. IMPRESSION: Normal head CT. No evidence of traumatic injury to the cervical spine. Mild degenerative changes. Electronically Signed   By: Julian Hy M.D.   On: 06/10/2018 01:48   Ct Cervical Spine Wo Contrast  Result Date: 06/10/2018 CLINICAL DATA:  Syncope EXAM: CT HEAD WITHOUT CONTRAST CT CERVICAL SPINE WITHOUT CONTRAST TECHNIQUE: Multidetector CT imaging of the head and cervical spine was performed following the standard protocol without intravenous contrast. Multiplanar CT image reconstructions of the cervical spine were also generated. COMPARISON:  MRI brain dated 08/17/2013 FINDINGS: CT HEAD FINDINGS Brain: No evidence of acute infarction, hemorrhage, hydrocephalus, extra-axial collection or mass lesion/mass effect. Vascular: No hyperdense vessel or unexpected calcification. Skull: Normal. Negative for fracture or focal lesion. Sinuses/Orbits: The visualized paranasal sinuses are essentially clear. The mastoid air cells are unopacified. Other: None. CT CERVICAL SPINE FINDINGS Alignment: Normal. Skull base and vertebrae: No acute fracture. No primary bone lesion or focal pathologic process. Soft tissues and spinal canal: No prevertebral fluid or swelling. No visible canal hematoma. Disc levels: Mild degenerative changes of the mid cervical spine. Spinal canal is patent. Upper chest: Visualized lung apices are clear. Other: Visualized thyroid is unremarkable. IMPRESSION: Normal head CT. No evidence of traumatic injury to the cervical spine. Mild degenerative changes. Electronically Signed   By: Julian Hy M.D.   On: 06/10/2018 01:48    EKG: Independently reviewed. nsr no acute issues Old chart reviewed Case discussed with Dr. Wyvonnia Dusky in the emergency department  Assessment/Plan 53 year old female with syncopal episode now with bilateral arm tingling Principal Problem:   Syncope-obtain cardiac echo.  Serial troponin.  Observe on telemetry monitoring.  Frequent neurological checks.  Active Problems:   Acute neck pain-MRI neck pending.  Neurology consulted.    History of substance abuse (HCC)-urine drug screen pending     DVT prophylaxis: SCDs Code Status: Full Family Communication: None Disposition Plan: Less than 24 hours Consults called: Neurology Admission status: Observation   Jenie Parish A MD [facility]  If 7PM-7AM, please contact  night-coverage [email or web address] Password [facility]  06/10/2018, 3:23 AM

## 2018-06-10 NOTE — Progress Notes (Signed)
Pt arrived on unit complaining of pain/tinglining in both upper arms. Pt requested assistance with toileting due to pain/discomfort. BP elevated upon arrival, see flow sheets. Pt placed on cardia monitor, normal sinus rhythm to cardiac monitor. Dr. Derrill Kay paged and made aware of pt status.

## 2018-06-10 NOTE — Consult Note (Signed)
Chief Complaint   Chief Complaint  Patient presents with  . Loss of Consciousness    HPI   Consult requested by: Dr Eliseo Squires Reason for consult: Cervical foraminal stenosis  HPI: [name] is a 53 y.o. female who presented to ER yesterday after a syncopal episode. Reports having a "hot flash" and then next thing she knew she was on the ground. She tells me she probably hit a chair with her right shoulder on the way down because she has right shoulder pain. In addition to shoulder pain, she also endorses "numbness" in her BUE. She describes "shocking" pains that start in her hands and radiate upwards to her mid bicep with any light touch. She also states she has significant weakness in her UE. Denies LE symptoms. Also has left sided neck pain. MRI of cervical spine was ordered revealing foraminal stenosis so NSY consultation was requested.   She has a history of polysubstance abuse (marijuana and cocaine). Last used marijuana yesterday. Denies cocaine use.   Patient Active Problem List   Diagnosis Date Noted  . Syncope 06/10/2018  . Acute neck pain 06/10/2018  . Screen for STD (sexually transmitted disease) 05/07/2016  . Abdominal pain 05/07/2016  . Encounter for health maintenance examination in adult 07/26/2015  . History of migraine headaches 07/26/2015  . Tobacco use 07/26/2015  . Vitamin D deficiency 07/26/2015  . Impaired fasting blood sugar 07/26/2015  . Family history of colon cancer 07/26/2015  . Vaccine counseling 07/26/2015  . History of substance abuse ([place]) 07/26/2015  . Asthma, moderate persistent 07/26/2015  . Dyspnea 07/26/2015    PMH: Past Medical History:  Diagnosis Date  . Adnexal mass 2006   Bilateral ovarian cystic masses- recomended GYN FU.   Marland Kitchen Alcohol abuse    Hx of, quit in 2009  . Ankle fracture    Bimalleolar sp closed reduction under floroscopy.   . Bronchial asthma   . Depression    Follows with [facility], history of voluntary admission to [facility].   History of suisidal ideation with drug od (50 pills of ibuprofen).   . History of cocaine abuse ([place])    Quit in 2009  . Marijuana abuse    Hx of, quit in 2009  . Menorrhagia 2006   Endometiral Biopsy- DEGENERATING SECRETORY-TYPE ENDOMETRIUM  . Migraine headache   . Normocytic anemia   . Tobacco abuse   . Transaminitis    Considered to be secondary to alchol use.     PSH: Past Surgical History:  Procedure Laterality Date  . [place] CYST EXCISION  2015  . BILATERAL SALPINGECTOMY Bilateral 07/18/2014   Procedure: BILATERAL SALPINGECTOMY;  Surgeon: Luz Lex, MD;  Location: [facility];  Service: Gynecology;  Laterality: Bilateral;  . Close reduction of bimalleolar ankle fracture     left  . LAPAROSCOPIC APPENDECTOMY  07/10/2011   Procedure: APPENDECTOMY LAPAROSCOPIC;  Surgeon: Rolm Bookbinder, MD;  Location: [facility];  Service: General;  Laterality: N/A;  . LAPAROSCOPY N/A 07/18/2014   Procedure: LAPAROSCOPY OPERATIVE WITH ENDOCATCH;  Surgeon: Luz Lex, MD;  Location: [facility];  Service: Gynecology;  Laterality: N/A;  . LYSIS OF ADHESION N/A 07/18/2014   Procedure: LYSIS OF ADHESION;  Surgeon: Luz Lex, MD;  Location: [facility];  Service: Gynecology;  Laterality: N/A;    Medications Prior to Admission  Medication Sig Dispense Refill Last Dose  . zonisamide (ZONEGRAN) 100 MG capsule Take 200 mg by mouth daily.  2 06/09/2018 at Unknown time  .  albuterol (PROAIR HFA) 108 (90 Base) MCG/ACT inhaler Inhale 2 puffs into the lungs every 6 (six) hours as needed for wheezing or shortness of breath. (Patient not taking: Reported on 06/10/2018) 18 g 2 Not Taking at Unknown time  . budesonide-formoterol (SYMBICORT) 160-4.5 MCG/ACT inhaler Inhale 2 puffs into the lungs 2 (two) times daily. (Patient not taking: Reported on 06/10/2018) 1 Inhaler 12 Not Taking at Unknown time  . Cholecalciferol (VITAMIN D) 2000 UNITS tablet Take 1 tablet (2,000 Units total) by mouth daily. (Patient not taking:  Reported on 06/10/2018) 90 tablet 3 Not Taking at Unknown time  . ciprofloxacin (CIPRO) 500 MG tablet Take 1 tablet (500 mg total) by mouth 2 (two) times daily. (Patient not taking: Reported on 06/10/2018) 10 tablet 0 Not Taking at Unknown time  . diclofenac (VOLTAREN) 75 MG EC tablet Take 1 tablet (75 mg total) by mouth 2 (two) times daily. (Patient not taking: Reported on 06/10/2018) 30 tablet 3 Not Taking at Unknown time  . diclofenac sodium (VOLTAREN) 1 % GEL Apply 4 g topically 4 (four) times daily. (Patient not taking: Reported on 06/10/2018) 100 g 0 Not Taking at Unknown time  . fluticasone (CUTIVATE) 0.05 % cream Apply topically 2 (two) times daily. (Patient not taking: Reported on 06/10/2018) 30 g 1 Not Taking at Unknown time  . HYDROcodone-acetaminophen (NORCO/VICODIN) 5-325 MG tablet Take 1 tablet by mouth every 6 (six) hours as needed for severe pain. (Patient not taking: Reported on 06/10/2018) 6 tablet 0 Not Taking at Unknown time  . lidocaine (LIDODERM) 5 % Place 1 patch onto the skin daily. Remove & Discard patch within 12 hours or as directed by MD (Patient not taking: Reported on 06/10/2018) 30 patch 0 Not Taking at Unknown time  . linaclotide (LINZESS) 145 MCG CAPS capsule Take 1 capsule (145 mcg total) by mouth daily before breakfast. (Patient not taking: Reported on 06/10/2018) 30 capsule 5 Not Taking at Unknown time  . methocarbamol (ROBAXIN) 500 MG tablet Take 1 tablet (500 mg total) by mouth 2 (two) times daily. (Patient not taking: Reported on 06/10/2018) 20 tablet 0 Not Taking at Unknown time  . omeprazole (PRILOSEC) 40 MG capsule Take 1 capsule (40 mg total) by mouth daily. (Patient not taking: Reported on 05/07/2016) 30 capsule 2 Not Taking at Unknown time  . oxyCODONE-acetaminophen (PERCOCET) 5-325 MG tablet Take 1 tablet by mouth every 4 (four) hours as needed for severe pain. (Patient not taking: Reported on 05/07/2016) 10 tablet 0 Not Taking at Unknown time  . polyethylene glycol  powder (GLYCOLAX/MIRALAX) powder Take 255 g by mouth daily. (Patient not taking: Reported on 05/07/2016) 255 g 0 Not Taking at Unknown time  . promethazine (PHENERGAN) 25 MG tablet Take 1 tablet (25 mg total) by mouth every 8 (eight) hours as needed for nausea or vomiting. (Patient not taking: Reported on 05/07/2016) 20 tablet 0 Not Taking at Unknown time  . Vitamin D, Ergocalciferol, (DRISDOL) 50000 units CAPS capsule TAKE 1 CAPSULE (50,000 UNITS TOTAL) BY MOUTH EVERY 7 (SEVEN) DAYS. (Patient not taking: Reported on 06/10/2018) 12 capsule 3 Not Taking at Unknown time    SH: Social History   Tobacco Use  . Smoking status: Current Every Day Smoker    Packs/day: 0.50    Years: 10.00    Pack years: 5.00    Types: Cigarettes  . Smokeless tobacco: Never Used  Substance Use Topics  . Alcohol use: No    Comment: .   Marland Kitchen Drug use: Yes  Types: Marijuana    Comment: Quit Coccaine and marijuana in 2009; denies    MEDS: Prior to Admission medications   Medication Sig Start Date End Date Taking? Authorizing Provider  zonisamide (ZONEGRAN) 100 MG capsule Take 200 mg by mouth daily. 06/20/14  Yes [provider]  albuterol (PROAIR HFA) 108 (90 Base) MCG/ACT inhaler Inhale 2 puffs into the lungs every 6 (six) hours as needed for wheezing or shortness of breath. Patient not taking: Reported on 06/10/2018 07/26/15   Tysinger, Camelia Eng, PA-C  budesonide-formoterol [facility]) 160-4.5 MCG/ACT inhaler Inhale 2 puffs into the lungs 2 (two) times daily. Patient not taking: Reported on 06/10/2018 07/09/15   Tysinger, Camelia Eng, PA-C  Cholecalciferol (VITAMIN D) 2000 UNITS tablet Take 1 tablet (2,000 Units total) by mouth daily. Patient not taking: Reported on 06/10/2018 11/09/14   Tysinger, Camelia Eng, PA-C  ciprofloxacin (CIPRO) 500 MG tablet Take 1 tablet (500 mg total) by mouth 2 (two) times daily. Patient not taking: Reported on 06/10/2018 05/07/16   Tysinger, Camelia Eng, PA-C  diclofenac (VOLTAREN) 75 MG EC  tablet Take 1 tablet (75 mg total) by mouth 2 (two) times daily. Patient not taking: Reported on 06/10/2018 11/28/16   Tysinger, Camelia Eng, PA-C  diclofenac sodium (VOLTAREN) 1 % GEL Apply 4 g topically 4 (four) times daily. Patient not taking: Reported on 06/10/2018 04/02/18   Joy, Shawn C, PA-C  fluticasone (CUTIVATE) 0.05 % cream Apply topically 2 (two) times daily. Patient not taking: Reported on 06/10/2018 02/01/14   Billy Fischer, MD  HYDROcodone-acetaminophen (NORCO/VICODIN) 5-325 MG tablet Take 1 tablet by mouth every 6 (six) hours as needed for severe pain. Patient not taking: Reported on 06/10/2018 04/02/18   Joy, Shawn C, PA-C  lidocaine (LIDODERM) 5 % Place 1 patch onto the skin daily. Remove & Discard patch within 12 hours or as directed by MD Patient not taking: Reported on 06/10/2018 04/02/18   Lorayne Bender, PA-C  linaclotide [facility]) 145 MCG CAPS capsule Take 1 capsule (145 mcg total) by mouth daily before breakfast. Patient not taking: Reported on 06/10/2018 12/19/15   Mauri Pole, MD  methocarbamol (ROBAXIN) 500 MG tablet Take 1 tablet (500 mg total) by mouth 2 (two) times daily. Patient not taking: Reported on 06/10/2018 04/02/18   Lorayne Bender, PA-C  omeprazole (PRILOSEC) 40 MG capsule Take 1 capsule (40 mg total) by mouth daily. Patient not taking: Reported on 05/07/2016 03/25/16   Tysinger, Camelia Eng, PA-C  oxyCODONE-acetaminophen (PERCOCET) 5-325 MG tablet Take 1 tablet by mouth every 4 (four) hours as needed for severe pain. Patient not taking: Reported on 05/07/2016 12/04/15   Tysinger, Camelia Eng, PA-C  polyethylene glycol powder (GLYCOLAX/MIRALAX) powder Take 255 g by mouth daily. Patient not taking: Reported on 05/07/2016 12/04/15   Tysinger, Camelia Eng, PA-C  promethazine (PHENERGAN) 25 MG tablet Take 1 tablet (25 mg total) by mouth every 8 (eight) hours as needed for nausea or vomiting. Patient not taking: Reported on 05/07/2016 03/18/16   Tysinger, Camelia Eng, PA-C  Vitamin D,  Ergocalciferol, (DRISDOL) 50000 units CAPS capsule TAKE 1 CAPSULE (50,000 UNITS TOTAL) BY MOUTH EVERY 7 (SEVEN) DAYS. Patient not taking: Reported on 06/10/2018 05/16/16   Carlena Hurl, PA-C    ALLERGY: No Known Allergies  Social History   Tobacco Use  . Smoking status: Current Every Day Smoker    Packs/day: 0.50    Years: 10.00    Pack years: 5.00    Types: Cigarettes  .  Smokeless tobacco: Never Used  Substance Use Topics  . Alcohol use: No    Comment: .      Family History  Problem Relation Age of Onset  . Diabetes Mother   . Hypertension Mother   . Colon cancer Mother 25  . Hypertension Father   . Stroke Father   . Diabetes Brother   . Obesity Brother   . Hypertension Brother   . Heart disease Neg Hx      ROS   Review of Systems  Constitutional: Negative for chills and fever.  Eyes: Negative for blurred vision, double vision and photophobia.  Gastrointestinal: Negative for nausea and vomiting.  Genitourinary: Negative.   Musculoskeletal: Positive for falls, myalgias and neck pain. Negative for back pain and joint pain.  Neurological: Positive for tingling (BUE) and sensory change (BUE). Negative for dizziness, tremors, speech change, focal weakness, seizures, loss of consciousness, weakness and headaches.    Exam   Vitals:   06/10/18 0800 06/10/18 1123  BP: (!) 165/92 (!) 185/97  Pulse: 74 70  Resp: (!) 22 20  Temp: 97.9 F (36.6 C) 98.1 F (36.7 C)  SpO2: 97% 98%   General appearance: WDWN, NAD Eyes: No scleral injection Cardiovascular: Regular rate and rhythm without murmurs, rubs, gallops. No edema or variciosities. Distal pulses normal. Pulmonary: Effort normal, non-labored breathing Musculoskeletal:     Muscle tone upper extremities: Normal    Muscle tone lower extremities: Normal    Motor exam: Upper Extremities Deltoid Bicep Tricep Grip  Right 5/5 5/5 5/5 5/5  Left 5/5 5/5 5/5 5/5   Lower Extremity IP Quad PF DF EHL  Right 5/5 5/5  5/5 5/5 5/5  Left 5/5 5/5 5/5 5/5 5/5   Neurological Mental Status:    - Patient is awake, alert, oriented to person, place, month, year, and situation    - Patient is able to give a clear and coherent history.    - No signs of aphasia or neglect Cranial Nerves    - II: Visual Fields are full. PERRL    - III/IV/VI: EOMI without ptosis or diploplia.     - V: Facial sensation is grossly normal    - VII: Facial movement is symmetric.     - VIII: hearing is intact to voice    - X: Uvula elevates symmetrically    - XI: Shoulder shrug is symmetric.    - XII: tongue is midline without atrophy or fasciculations.  Sensory: light touch causes her severe pain Deep Tendon Reflexes    - 2+ and symmetric in the biceps and patellae.  Plantars   - Toes are downgoing bilaterally.   Reports excruciating pain with any type of movement in her BUE Is able to move all extremities however with grossly normal strength Reflex testing with light touch did not cause any discomfort.  Results - Imaging/Labs   Results for orders placed or performed during the hospital encounter of 06/09/18 (from the past 48 hour(s))  Basic metabolic panel     Status: Abnormal   Collection Time: 06/09/18 10:05 PM  Result Value Ref Range   Sodium 141 135 - 145 mmol/L   Potassium 3.9 3.5 - 5.1 mmol/L   Chloride 108 98 - 111 mmol/L   CO2 21 (L) 22 - 32 mmol/L   Glucose, Bld 115 (H) 70 - 99 mg/dL   BUN 11 6 - 20 mg/dL   Creatinine, Ser 1.37 (H) 0.44 - 1.00 mg/dL   Calcium 9.2 8.9 -  10.3 mg/dL   GFR calc non Af Amer 44 (L) >60 mL/min   GFR calc Af Amer 51 (L) >60 mL/min   Anion gap 12 5 - 15    Comment: Performed at [facility] [address], [place], [place] [address]  CBC     Status: Abnormal   Collection Time: 06/09/18 10:05 PM  Result Value Ref Range   WBC 12.6 (H) 4.0 - 10.5 K/uL   RBC 4.62 3.87 - 5.11 MIL/uL   Hemoglobin 14.6 12.0 - 15.0 g/dL   HCT 44.9 36.0 - 46.0 %   MCV 97.2 80.0 - 100.0 fL   MCH  31.6 26.0 - 34.0 pg   MCHC 32.5 30.0 - 36.0 g/dL   RDW 13.8 11.5 - 15.5 %   Platelets 227 150 - 400 K/uL   nRBC 0.0 0.0 - 0.2 %    Comment: Performed at [facility], [place] [address], [place], [place] [address]  I-Stat beta hCG blood, ED     Status: Abnormal   Collection Time: 06/09/18 10:57 PM  Result Value Ref Range   I-stat hCG, quantitative 9.2 (H) <5 mIU/mL   Comment 3            Comment:   GEST. AGE      CONC.  (mIU/mL)   <=1 WEEK        5 - 50     2 WEEKS       50 - 500     3 WEEKS       100 - 10,000     4 WEEKS     1,000 - 30,000        FEMALE AND NON-PREGNANT FEMALE:     LESS THAN 5 mIU/mL   Troponin I - Now Then Q6H     Status: None   Collection Time: 06/10/18  4:12 AM  Result Value Ref Range   Troponin I <0.03 <0.03 ng/mL    Comment: Performed at [facility], [place] [address] [place], [place] [address]  Basic metabolic panel     Status: Abnormal   Collection Time: 06/10/18  4:12 AM  Result Value Ref Range   Sodium 140 135 - 145 mmol/L   Potassium 3.8 3.5 - 5.1 mmol/L   Chloride 109 98 - 111 mmol/L   CO2 21 (L) 22 - 32 mmol/L   Glucose, Bld 115 (H) 70 - 99 mg/dL   BUN 10 6 - 20 mg/dL   Creatinine, Ser 1.21 (H) 0.44 - 1.00 mg/dL   Calcium 9.4 8.9 - 10.3 mg/dL   GFR calc non Af Amer 51 (L) >60 mL/min   GFR calc Af Amer 60 (L) >60 mL/min   Anion gap 10 5 - 15    Comment: Performed at [facility] [address], [place], [place] [address]  CBC     Status: Abnormal   Collection Time: 06/10/18  4:12 AM  Result Value Ref Range   WBC 12.4 (H) 4.0 - 10.5 K/uL   RBC 4.63 3.87 - 5.11 MIL/uL   Hemoglobin 14.7 12.0 - 15.0 g/dL   HCT 44.8 36.0 - 46.0 %   MCV 96.8 80.0 - 100.0 fL   MCH 31.7 26.0 - 34.0 pg   MCHC 32.8 30.0 - 36.0 g/dL   RDW 13.8 11.5 - 15.5 %   Platelets 203 150 - 400 K/uL   nRBC 0.0 0.0 - 0.2 %    Comment: Performed  at [facility], [place] [address], [place], [place] [address]  hCG, serum, qualitative     Status: Abnormal    Collection Time: 06/10/18  4:12 AM  Result Value Ref Range   Preg, Serum POSITIVE (A) NEGATIVE    Comment: Performed at [facility] [address], [place], [place] [address]  Urinalysis, Routine w reflex microscopic     Status: Abnormal   Collection Time: 06/10/18  4:58 AM  Result Value Ref Range   Color, Urine YELLOW YELLOW   APPearance CLOUDY (A) CLEAR   Specific Gravity, Urine 1.010 1.005 - 1.030   pH 8.0 5.0 - 8.0   Glucose, UA NEGATIVE NEGATIVE mg/dL   Hgb urine dipstick NEGATIVE NEGATIVE   Bilirubin Urine NEGATIVE NEGATIVE   Ketones, ur NEGATIVE NEGATIVE mg/dL   Protein, ur NEGATIVE NEGATIVE mg/dL   Nitrite NEGATIVE NEGATIVE   Leukocytes, UA NEGATIVE NEGATIVE    Comment: Performed at [facility] [address], [place], [place] [address]  Rapid urine drug screen (hospital performed)     Status: Abnormal   Collection Time: 06/10/18  4:58 AM  Result Value Ref Range   Opiates NONE DETECTED NONE DETECTED   Cocaine POSITIVE (A) NONE DETECTED   Benzodiazepines NONE DETECTED NONE DETECTED   Amphetamines NONE DETECTED NONE DETECTED   Tetrahydrocannabinol POSITIVE (A) NONE DETECTED   Barbiturates NONE DETECTED NONE DETECTED    Comment: (NOTE) DRUG SCREEN FOR MEDICAL PURPOSES ONLY.  IF CONFIRMATION IS NEEDED FOR ANY PURPOSE, NOTIFY LAB WITHIN 5 DAYS. LOWEST DETECTABLE LIMITS FOR URINE DRUG SCREEN Drug Class                     Cutoff (ng/mL) Amphetamine and metabolites    1000 Barbiturate and metabolites    200 Benzodiazepine                 235 Tricyclics and metabolites     300 Opiates and metabolites        300 Cocaine and metabolites        300 THC                            50 Performed at [facility], [place] [address], [place], [place] [address]   Glucose, capillary     Status: Abnormal   Collection Time: 06/10/18  6:40 AM  Result Value Ref Range   Glucose-Capillary 108 (H) 70 - 99 mg/dL  Troponin I - Now Then Q6H     Status: None    Collection Time: 06/10/18  9:11 AM  Result Value Ref Range   Troponin I <0.03 <0.03 ng/mL    Comment: Performed at [facility], [place] [address] [place], [place] [address]    Dg Chest 2 View  Result Date: 06/10/2018 CLINICAL DATA:  Syncopal episode today. Dizziness, chest pain, and shortness of breath. Hot and flushed feeling. EXAM: CHEST - 2 VIEW COMPARISON:  08/02/2014 FINDINGS: The heart size and mediastinal contours are within normal limits. Both lungs are clear. The visualized skeletal structures are unremarkable. IMPRESSION: No active cardiopulmonary disease. Electronically Signed   By: Lucienne Capers M.D.   On: 06/10/2018 01:20   Dg Shoulder Right  Result Date: 06/10/2018 CLINICAL DATA:  Witnessed syncopal episode today. Shock feeling in the right shoulder and arm since the fall. EXAM: RIGHT SHOULDER - 2+ VIEW COMPARISON:  None. FINDINGS: There is  no evidence of fracture or dislocation. There is no evidence of arthropathy or other focal bone abnormality. Soft tissues are unremarkable. IMPRESSION: Negative. Electronically Signed   By: Lucienne Capers M.D.   On: 06/10/2018 01:21   Ct Head Wo Contrast  Result Date: 06/10/2018 CLINICAL DATA:  Syncope EXAM: CT HEAD WITHOUT CONTRAST CT CERVICAL SPINE WITHOUT CONTRAST TECHNIQUE: Multidetector CT imaging of the head and cervical spine was performed following the standard protocol without intravenous contrast. Multiplanar CT image reconstructions of the cervical spine were also generated. COMPARISON:  MRI brain dated 08/17/2013 FINDINGS: CT HEAD FINDINGS Brain: No evidence of acute infarction, hemorrhage, hydrocephalus, extra-axial collection or mass lesion/mass effect. Vascular: No hyperdense vessel or unexpected calcification. Skull: Normal. Negative for fracture or focal lesion. Sinuses/Orbits: The visualized paranasal sinuses are essentially clear. The mastoid air cells are unopacified. Other: None. CT CERVICAL SPINE FINDINGS Alignment:  Normal. Skull base and vertebrae: No acute fracture. No primary bone lesion or focal pathologic process. Soft tissues and spinal canal: No prevertebral fluid or swelling. No visible canal hematoma. Disc levels: Mild degenerative changes of the mid cervical spine. Spinal canal is patent. Upper chest: Visualized lung apices are clear. Other: Visualized thyroid is unremarkable. IMPRESSION: Normal head CT. No evidence of traumatic injury to the cervical spine. Mild degenerative changes. Electronically Signed   By: Julian Hy M.D.   On: 06/10/2018 01:48   Ct Cervical Spine Wo Contrast  Result Date: 06/10/2018 CLINICAL DATA:  Syncope EXAM: CT HEAD WITHOUT CONTRAST CT CERVICAL SPINE WITHOUT CONTRAST TECHNIQUE: Multidetector CT imaging of the head and cervical spine was performed following the standard protocol without intravenous contrast. Multiplanar CT image reconstructions of the cervical spine were also generated. COMPARISON:  MRI brain dated 08/17/2013 FINDINGS: CT HEAD FINDINGS Brain: No evidence of acute infarction, hemorrhage, hydrocephalus, extra-axial collection or mass lesion/mass effect. Vascular: No hyperdense vessel or unexpected calcification. Skull: Normal. Negative for fracture or focal lesion. Sinuses/Orbits: The visualized paranasal sinuses are essentially clear. The mastoid air cells are unopacified. Other: None. CT CERVICAL SPINE FINDINGS Alignment: Normal. Skull base and vertebrae: No acute fracture. No primary bone lesion or focal pathologic process. Soft tissues and spinal canal: No prevertebral fluid or swelling. No visible canal hematoma. Disc levels: Mild degenerative changes of the mid cervical spine. Spinal canal is patent. Upper chest: Visualized lung apices are clear. Other: Visualized thyroid is unremarkable. IMPRESSION: Normal head CT. No evidence of traumatic injury to the cervical spine. Mild degenerative changes. Electronically Signed   By: Julian Hy M.D.   On:  06/10/2018 01:48   Mr Cervical Spine Wo Contrast  Result Date: 06/10/2018 CLINICAL DATA:  Fall.  Bilateral upper extremity tingling. EXAM: MRI CERVICAL SPINE WITHOUT CONTRAST TECHNIQUE: Multiplanar, multisequence MR imaging of the cervical spine was performed. No intravenous contrast was administered. COMPARISON:  None. FINDINGS: Alignment: Grade 1 anterolisthesis at C3-4 Vertebrae: There is right C2-3 facet edema. No compression fracture. Cord: Normal caliber and signal. Posterior Fossa, vertebral arteries, paraspinal tissues: Visualized posterior fossa is normal. Vertebral artery flow voids are preserved. No prevertebral effusion. Disc levels: Sagittal imaging includes the atlantoaxial joint to the level of the T3-4 disc space, with axial imaging of the disc spaces from C2-3 to C7-T1. The craniocervical junction is normal. C2-3: There is right facet hypertrophy with moderate right foraminal stenosis. No spinal canal stenosis. C3-4: There is bilateral uncovertebral and facet hypertrophy with severe right and moderate left foraminal stenosis. No central spinal canal stenosis. C4-5: Disc space narrowing with  anterior osteophyte. Small disc bulge with bilateral uncovertebral hypertrophy with moderate right and severe left neural foraminal stenosis. No spinal canal stenosis. C5-6: Left-greater-than-right uncovertebral hypertrophy with severe bilateral, left-greater-than-right neural foraminal stenosis. C6-7: Small disc bulge without spinal canal or neural foraminal stenosis. C7-T1: No spinal canal or neural foraminal stenosis. No disc herniation. T1-T4: No disc herniation. No spinal canal or neural foraminal stenosis. IMPRESSION: 1. No acute abnormality. 2. 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 3. Edema of the right C2-3 facet could serve as a source of local neck pain. Electronically Signed   By:  Ulyses Jarred M.D.   On: 06/10/2018 03:56    Impression/Plan   53 y.o. female with history of polysubstance abuse who presented to ER after a syncopal episode. She complained of BUE numbness and weakness so an MRI was ordered of her cervical spine. I have reviewed the MRI which is significant for multilevel foraminal stenosis without significant central canal stenosis. On exam she has grossly normal strength in her BUE and BLE. She reports light touch causes excruciating pain although this is inconsistent.  There is no indication for acute NS intervention. We can follow up on an outpatient basis. Agree with decadron (4mg  q 6 hours with medrol dose pack at time of discharge) and gabapentin TID. Agree with avoiding narcotics based on polysubstance abuse. Please call for any concerns.

## 2018-06-10 NOTE — Progress Notes (Signed)
Patient admitted after midnight, please see H&P.  HEre with syncopal event after multidrug use.  Is c/o b/l arm weakness/numbess/pain.  Says it feels like she is being stabbed when she is touched.  During my exam, she was moving both arms on own when distracted.  MRI does show:  1.  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 2. Edema of the right C2-3 facet could serve as a source of local neck pain.  NS consult appreciated  Asking for narcotics for pain  -will start steroids/neurontin but ultimately will defer to NS For recommendations  Eulogio Bear DO

## 2018-06-10 NOTE — Plan of Care (Signed)
  Problem: Education: Goal: Knowledge of General Education information will improve Description: Including pain rating scale, medication(s)/side effects and non-pharmacologic comfort measures Outcome: Progressing   Problem: Clinical Measurements: Goal: Diagnostic test results will improve Outcome: Progressing   

## 2018-06-10 NOTE — Progress Notes (Signed)
  Echocardiogram 2D Echocardiogram has been performed.  Darlina Sicilian M 06/10/2018, 10:18 AM

## 2018-06-10 NOTE — Progress Notes (Signed)
NURSING PROGRESS NOTE  [name] [id number] Admission Data: 06/10/2018 5:50 AM Attending Provider: Phillips Grout, MD ZWC:HENIDPO, No Pcp Per Code Status:full  [name] is a 53 y.o. female patient admitted from ED:  -No acute distress noted.  -No complaints of shortness of breath.  -No complaints of chest pain.   Cardiac Monitoring: Box # 05 in place. Cardiac monitor yields:normal sinus rhythm.  Blood pressure (!) 199/104, pulse 62, temperature 98.1 F (36.7 C), temperature source Oral, resp. rate 18, height 5\' 6"  (1.676 m), weight 63 kg, last menstrual period 12/24/2012, SpO2 97 %.   IV Fluids:  IV in place, occlusive dsg intact without redness, IV cath forearm left, condition patent and no redness none.   Allergies:  Patient has no known allergies.  Past Medical History:   has a past medical history of Adnexal mass (2006), Alcohol abuse, Ankle fracture, Bronchial asthma, Depression, History of cocaine abuse ([place]), Marijuana abuse, Menorrhagia (2006), Migraine headache, Normocytic anemia, Tobacco abuse, and Transaminitis.  Past Surgical History:   has a past surgical history that includes Close reduction of bimalleolar ankle fracture; laparoscopic appendectomy (07/10/2011); Bartholin gland cyst excision (2015); laparoscopy (N/A, 07/18/2014); Bilateral salpingectomy (Bilateral, 07/18/2014); and Lysis of adhesion (N/A, 07/18/2014).  Social History:   reports that she has been smoking cigarettes. She has a 5.00 pack-year smoking history. She has never used smokeless tobacco. She reports current drug use. Drug: Marijuana. She reports that she does not drink alcohol.  Skin: intact  Patient/Family orientated to room. Information packet given to patient/family. Admission inpatient armband information verified with patient/family to include name and date of birth and placed on patient arm. Side rails up x 2, fall assessment and education completed with patient/family. Patient/family  able to verbalize understanding of risk associated with falls and verbalized understanding to call for assistance before getting out of bed. Call light within reach. Patient/family able to voice and demonstrate understanding of unit orientation instructions.    Will continue to evaluate and treat per MD orders.

## 2018-06-11 DIAGNOSIS — Z8249 Family history of ischemic heart disease and other diseases of the circulatory system: Secondary | ICD-10-CM | POA: Diagnosis not present

## 2018-06-11 DIAGNOSIS — M542 Cervicalgia: Secondary | ICD-10-CM

## 2018-06-11 DIAGNOSIS — R9431 Abnormal electrocardiogram [ECG] [EKG]: Secondary | ICD-10-CM | POA: Diagnosis not present

## 2018-06-11 DIAGNOSIS — M4802 Spinal stenosis, cervical region: Secondary | ICD-10-CM | POA: Diagnosis not present

## 2018-06-11 DIAGNOSIS — Z79899 Other long term (current) drug therapy: Secondary | ICD-10-CM | POA: Diagnosis not present

## 2018-06-11 DIAGNOSIS — F1911 Other psychoactive substance abuse, in remission: Secondary | ICD-10-CM

## 2018-06-11 DIAGNOSIS — R200 Anesthesia of skin: Secondary | ICD-10-CM | POA: Diagnosis not present

## 2018-06-11 DIAGNOSIS — R202 Paresthesia of skin: Secondary | ICD-10-CM | POA: Diagnosis not present

## 2018-06-11 DIAGNOSIS — I10 Essential (primary) hypertension: Secondary | ICD-10-CM | POA: Diagnosis not present

## 2018-06-11 DIAGNOSIS — F1410 Cocaine abuse, uncomplicated: Secondary | ICD-10-CM | POA: Diagnosis not present

## 2018-06-11 DIAGNOSIS — F329 Major depressive disorder, single episode, unspecified: Secondary | ICD-10-CM | POA: Diagnosis not present

## 2018-06-11 DIAGNOSIS — F17210 Nicotine dependence, cigarettes, uncomplicated: Secondary | ICD-10-CM | POA: Diagnosis not present

## 2018-06-11 DIAGNOSIS — J4540 Moderate persistent asthma, uncomplicated: Secondary | ICD-10-CM | POA: Diagnosis not present

## 2018-06-11 DIAGNOSIS — R55 Syncope and collapse: Secondary | ICD-10-CM | POA: Diagnosis not present

## 2018-06-11 DIAGNOSIS — F1910 Other psychoactive substance abuse, uncomplicated: Secondary | ICD-10-CM | POA: Diagnosis not present

## 2018-06-11 LAB — GLUCOSE, CAPILLARY: Glucose-Capillary: 133 mg/dL — ABNORMAL HIGH (ref 70–99)

## 2018-06-11 MED ORDER — AMLODIPINE BESYLATE 10 MG PO TABS: 10.0000 mg | ORAL_TABLET | Freq: Every day | ORAL | 0 refills | Status: DC

## 2018-06-11 MED ORDER — METHYLPREDNISOLONE 4 MG PO TBPK: ORAL_TABLET | ORAL | 0 refills | Status: DC

## 2018-06-11 MED ORDER — LISINOPRIL 20 MG PO TABS
20.0000 mg | ORAL_TABLET | Freq: Every day | ORAL | Status: DC
  Administered 2018-06-11: 20 mg via ORAL
  Filled 2018-06-11: qty 1

## 2018-06-11 MED ORDER — GABAPENTIN 100 MG PO CAPS
200.0000 mg | ORAL_CAPSULE | Freq: Three times a day (TID) | ORAL | Status: DC
  Administered 2018-06-11: 200 mg via ORAL
  Filled 2018-06-11: qty 2

## 2018-06-11 MED ORDER — LISINOPRIL 20 MG PO TABS: 20.0000 mg | ORAL_TABLET | Freq: Every day | ORAL | 0 refills | Status: DC

## 2018-06-11 MED ORDER — GABAPENTIN 100 MG PO CAPS: 200.0000 mg | ORAL_CAPSULE | Freq: Three times a day (TID) | ORAL | 0 refills | Status: DC

## 2018-06-11 NOTE — Discharge Summary (Addendum)
Physician Discharge Summary  [name] NWG:[id number] DOB: [birth year] DOA: 06/09/2018  PCP: Patient, No Pcp Per- appointment at [place] date: 06/09/2018 Discharge date: 06/11/2018  Admitted From:  home Discharge disposition: home  Recommendations for Outpatient Follow-Up:   1. BMP and BP check at next appointment 2. Outpatient neurosurgery follow up   Discharge Diagnosis:   Principal Problem:   Syncope Active Problems:   History of substance abuse ([place])   Acute neck pain    Discharge Condition: Improved.  Diet recommendation: Low sodium, heart healthy  Wound care: None.  Code status: Full.   History of Present Illness:   [name] is a 53 y.o. female with medical history significant of polysubstance abuse comes in after smoking a joint which she personally rolled herself she denies it was laced with anything but also has a history of cocaine abuse soon thereafter started having a hot flash got dizzy and passed out.  She is injured her shoulder particularly the right shoulder.  Particularly normal state of health prior to this.  She is now having numbness and tingling down both arms.  Her CT of her head was unrevealing along with her neck.  Neurology was called who are recommending MRI of her neck to rule out spinal cord injury.  Patient denies any chest pain or shortness of breath.  Patient can move her arms.  Patient be referred for admission for syncope and possible neck injury.   Hospital Course by Problem:   HEre with syncopal event after multidrug use.  Is c/o b/l arm weakness/numbess/pain.  Says it feels like she is being stabbed when she is touched.  During my exam, she was moving both arms on own when distracted.  MRI does show:  1.  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 2. Edema of the right C2-3 facet could serve as a  source of local neck pain. NS consult appreciated -will start steroids/neurontin  -exam inconsistent  HTN -start norvasc and lisinopril -needs close outpatient follow up  syncope due to Drug abuse -suspect this as the cause of syncope -encouraged cessation -echo: Vigorous LV systolic function; elevated LVOT velocity of 2.2 m/s   likely related to vigorous LV function as visually aortic valve   opens well  Positive pregnancy test -patient reports hysterectomy -not sure why ordered -? Elevated bHcg from menopause    Medical Consultants:    NS  Discharge Exam:   Vitals:   06/11/18 0809 06/11/18 1100  BP: (!) 161/98 (!) 142/84  Pulse: 92 88  Resp: 18 18  Temp: 97.8 F (36.6 C) 98 F (36.7 C)  SpO2: 97% 98%   Vitals:   06/11/18 0643 06/11/18 0808 06/11/18 0809 06/11/18 1100  BP:  (!) 159/103 (!) 161/98 (!) 142/84  Pulse:   92 88  Resp:   18 18  Temp:   97.8 F (36.6 C) 98 F (36.7 C)  TempSrc:   Oral Oral  SpO2:   97% 98%  Weight: 64.4 kg     Height:        General exam: Appears calm and comfortable.   The results of significant diagnostics from this hospitalization (including imaging, microbiology, ancillary and laboratory) are listed below for reference.     Procedures and Diagnostic Studies:   Dg Chest 2 View  Result Date: 06/10/2018 CLINICAL DATA:  Syncopal episode today. Dizziness, chest pain,  and shortness of breath. Hot and flushed feeling. EXAM: CHEST - 2 VIEW COMPARISON:  08/02/2014 FINDINGS: The heart size and mediastinal contours are within normal limits. Both lungs are clear. The visualized skeletal structures are unremarkable. IMPRESSION: No active cardiopulmonary disease. Electronically Signed   By: Lucienne Capers M.D.   On: 06/10/2018 01:20   Dg Shoulder Right  Result Date: 06/10/2018 CLINICAL DATA:  Witnessed syncopal episode today. Shock feeling in the right shoulder and arm since the fall. EXAM: RIGHT SHOULDER - 2+ VIEW COMPARISON:   None. FINDINGS: There is no evidence of fracture or dislocation. There is no evidence of arthropathy or other focal bone abnormality. Soft tissues are unremarkable. IMPRESSION: Negative. Electronically Signed   By: Lucienne Capers M.D.   On: 06/10/2018 01:21   Ct Head Wo Contrast  Result Date: 06/10/2018 CLINICAL DATA:  Syncope EXAM: CT HEAD WITHOUT CONTRAST CT CERVICAL SPINE WITHOUT CONTRAST TECHNIQUE: Multidetector CT imaging of the head and cervical spine was performed following the standard protocol without intravenous contrast. Multiplanar CT image reconstructions of the cervical spine were also generated. COMPARISON:  MRI brain dated 08/17/2013 FINDINGS: CT HEAD FINDINGS Brain: No evidence of acute infarction, hemorrhage, hydrocephalus, extra-axial collection or mass lesion/mass effect. Vascular: No hyperdense vessel or unexpected calcification. Skull: Normal. Negative for fracture or focal lesion. Sinuses/Orbits: The visualized paranasal sinuses are essentially clear. The mastoid air cells are unopacified. Other: None. CT CERVICAL SPINE FINDINGS Alignment: Normal. Skull base and vertebrae: No acute fracture. No primary bone lesion or focal pathologic process. Soft tissues and spinal canal: No prevertebral fluid or swelling. No visible canal hematoma. Disc levels: Mild degenerative changes of the mid cervical spine. Spinal canal is patent. Upper chest: Visualized lung apices are clear. Other: Visualized thyroid is unremarkable. IMPRESSION: Normal head CT. No evidence of traumatic injury to the cervical spine. Mild degenerative changes. Electronically Signed   By: Julian Hy M.D.   On: 06/10/2018 01:48   Ct Cervical Spine Wo Contrast  Result Date: 06/10/2018 CLINICAL DATA:  Syncope EXAM: CT HEAD WITHOUT CONTRAST CT CERVICAL SPINE WITHOUT CONTRAST TECHNIQUE: Multidetector CT imaging of the head and cervical spine was performed following the standard protocol without intravenous contrast.  Multiplanar CT image reconstructions of the cervical spine were also generated. COMPARISON:  MRI brain dated 08/17/2013 FINDINGS: CT HEAD FINDINGS Brain: No evidence of acute infarction, hemorrhage, hydrocephalus, extra-axial collection or mass lesion/mass effect. Vascular: No hyperdense vessel or unexpected calcification. Skull: Normal. Negative for fracture or focal lesion. Sinuses/Orbits: The visualized paranasal sinuses are essentially clear. The mastoid air cells are unopacified. Other: None. CT CERVICAL SPINE FINDINGS Alignment: Normal. Skull base and vertebrae: No acute fracture. No primary bone lesion or focal pathologic process. Soft tissues and spinal canal: No prevertebral fluid or swelling. No visible canal hematoma. Disc levels: Mild degenerative changes of the mid cervical spine. Spinal canal is patent. Upper chest: Visualized lung apices are clear. Other: Visualized thyroid is unremarkable. IMPRESSION: Normal head CT. No evidence of traumatic injury to the cervical spine. Mild degenerative changes. Electronically Signed   By: Julian Hy M.D.   On: 06/10/2018 01:48   Mr Cervical Spine Wo Contrast  Result Date: 06/10/2018 CLINICAL DATA:  Fall.  Bilateral upper extremity tingling. EXAM: MRI CERVICAL SPINE WITHOUT CONTRAST TECHNIQUE: Multiplanar, multisequence MR imaging of the cervical spine was performed. No intravenous contrast was administered. COMPARISON:  None. FINDINGS: Alignment: Grade 1 anterolisthesis at C3-4 Vertebrae: There is right C2-3 facet edema. No compression fracture. Cord: Normal caliber  and signal. Posterior Fossa, vertebral arteries, paraspinal tissues: Visualized posterior fossa is normal. Vertebral artery flow voids are preserved. No prevertebral effusion. Disc levels: Sagittal imaging includes the atlantoaxial joint to the level of the T3-4 disc space, with axial imaging of the disc spaces from C2-3 to C7-T1. The craniocervical junction is normal. C2-3: There is right  facet hypertrophy with moderate right foraminal stenosis. No spinal canal stenosis. C3-4: There is bilateral uncovertebral and facet hypertrophy with severe right and moderate left foraminal stenosis. No central spinal canal stenosis. C4-5: Disc space narrowing with anterior osteophyte. Small disc bulge with bilateral uncovertebral hypertrophy with moderate right and severe left neural foraminal stenosis. No spinal canal stenosis. C5-6: Left-greater-than-right uncovertebral hypertrophy with severe bilateral, left-greater-than-right neural foraminal stenosis. C6-7: Small disc bulge without spinal canal or neural foraminal stenosis. C7-T1: No spinal canal or neural foraminal stenosis. No disc herniation. T1-T4: No disc herniation. No spinal canal or neural foraminal stenosis. IMPRESSION: 1. No acute abnormality. 2. 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 3. Edema of the right C2-3 facet could serve as a source of local neck pain. Electronically Signed   By: Ulyses Jarred M.D.   On: 06/10/2018 03:56     Labs:   Basic Metabolic Panel: Recent Labs  Lab 06/09/18 2205 06/10/18 0412  NA 141 140  K 3.9 3.8  CL 108 109  CO2 21* 21*  GLUCOSE 115* 115*  BUN 11 10  CREATININE 1.37* 1.21*  CALCIUM 9.2 9.4   GFR Estimated Creatinine Clearance: 50.9 mL/min (A) (by C-G formula based on SCr of 1.21 mg/dL (H)). Liver Function Tests: No results for input(s): AST, ALT, ALKPHOS, BILITOT, PROT, ALBUMIN in the last 168 hours. No results for input(s): LIPASE, AMYLASE in the last 168 hours. No results for input(s): AMMONIA in the last 168 hours. Coagulation profile No results for input(s): INR, PROTIME in the last 168 hours.  CBC: Recent Labs  Lab 06/09/18 2205 06/10/18 0412  WBC 12.6* 12.4*  HGB 14.6 14.7  HCT 44.9 44.8  MCV 97.2 96.8  PLT 227 203   Cardiac Enzymes: Recent Labs  Lab  06/10/18 0412 06/10/18 0911 06/10/18 1459  TROPONINI <0.03 <0.03 <0.03   BNP: Invalid input(s): POCBNP CBG: Recent Labs  Lab 06/10/18 0640 06/11/18 0642  GLUCAP 108* 133*   D-Dimer No results for input(s): DDIMER in the last 72 hours. Hgb A1c No results for input(s): HGBA1C in the last 72 hours. Lipid Profile No results for input(s): CHOL, HDL, LDLCALC, TRIG, CHOLHDL, LDLDIRECT in the last 72 hours. Thyroid function studies No results for input(s): TSH, T4TOTAL, T3FREE, THYROIDAB in the last 72 hours.  Invalid input(s): FREET3 Anemia work up No results for input(s): VITAMINB12, FOLATE, FERRITIN, TIBC, IRON, RETICCTPCT in the last 72 hours. Microbiology No results found for this or any previous visit (from the past 240 hour(s)).   Discharge Instructions:   Discharge Instructions    Diet - low sodium heart healthy   Complete by:  As directed    Discharge instructions   Complete by:  As directed    Will need close follow up with a PCP for BP medication titration as well as labs BMP 1 week as well as BP check STOP COCAINE AND MARIJUANA   Increase activity slowly   Complete by:  As directed      Allergies as of 06/11/2018   No Known Allergies     Medication  List    STOP taking these medications   albuterol 108 (90 Base) MCG/ACT inhaler Commonly known as:  PROAIR HFA   budesonide-formoterol 160-4.5 MCG/ACT inhaler Commonly known as:  SYMBICORT   ciprofloxacin 500 MG tablet Commonly known as:  CIPRO   diclofenac 75 MG EC tablet Commonly known as:  VOLTAREN   diclofenac sodium 1 % Gel Commonly known as:  VOLTAREN   fluticasone 0.05 % cream Commonly known as:  CUTIVATE   HYDROcodone-acetaminophen 5-325 MG tablet Commonly known as:  NORCO/VICODIN   lidocaine 5 % Commonly known as:  LIDODERM   linaclotide 145 MCG Caps capsule Commonly known as:  LINZESS   methocarbamol 500 MG tablet Commonly known as:  ROBAXIN   omeprazole 40 MG capsule Commonly  known as:  PRILOSEC   oxyCODONE-acetaminophen 5-325 MG tablet Commonly known as:  PERCOCET   polyethylene glycol powder powder Commonly known as:  GLYCOLAX/MIRALAX   promethazine 25 MG tablet Commonly known as:  PHENERGAN   Vitamin D (Ergocalciferol) 1.25 MG (50000 UT) Caps capsule Commonly known as:  DRISDOL   Vitamin D 50 MCG (2000 UT) tablet   zonisamide 100 MG capsule Commonly known as:  ZONEGRAN     TAKE these medications   amLODipine 10 MG tablet Commonly known as:  NORVASC Take 1 tablet (10 mg total) by mouth daily. Start taking on:  June 12, 2018   gabapentin 100 MG capsule Commonly known as:  NEURONTIN Take 2 capsules (200 mg total) by mouth 3 (three) times daily.   lisinopril 20 MG tablet Commonly known as:  PRINIVIL,ZESTRIL Take 1 tablet (20 mg total) by mouth daily. Start taking on:  June 12, 2018   methylPREDNISolone 4 MG Tbpk tablet Commonly known as:  MEDROL DOSEPAK Day 1:  8 mg PO before breakfast, 4 mg after lunch, 4 mg after dinner, and 8 mg at bedtime  Day 2:  4 mg PO before breakfast, 4 mg after lunch, 4 mg after dinner, and 8 mg at bedtime Day 3:  4 mg PO before breakfast, 4 mg after lunch, 4 mg after dinner, and 4 mg at bedtime Day 4:  4 mg PO before breakfast, 4 mg after lunch, and 4 mg at bedtime  Day 5:  4 mg PO before breakfast, and 4 mg at bedtime Day 6:  4 mg PO before breakfast      Follow-up Information    Consuella Lose, MD. Call.   Specialty:  Neurosurgery Why:  for an appointment Contact information: [place]. [address] [place] [address] [place] [address] [phone]        Primary Care at [facility]. Go on 07/01/2018.   Specialty:  Family Medicine Why:  at 1:30pm for your hospital follow-up appointment Contact information: [address] [place] [phone]           Time coordinating discharge: 25 min  Signed:  Geradine Girt DO  [facility] 06/11/2018,  5:58 PM

## 2018-06-21 DIAGNOSIS — M79641 Pain in right hand: Secondary | ICD-10-CM | POA: Diagnosis not present

## 2018-06-21 DIAGNOSIS — M47812 Spondylosis without myelopathy or radiculopathy, cervical region: Secondary | ICD-10-CM | POA: Diagnosis not present

## 2018-06-21 DIAGNOSIS — M79642 Pain in left hand: Secondary | ICD-10-CM | POA: Diagnosis not present

## 2018-06-28 DIAGNOSIS — M5412 Radiculopathy, cervical region: Secondary | ICD-10-CM | POA: Diagnosis not present

## 2018-06-28 DIAGNOSIS — Z6825 Body mass index (BMI) 25.0-25.9, adult: Secondary | ICD-10-CM | POA: Diagnosis not present

## 2018-06-28 DIAGNOSIS — M4802 Spinal stenosis, cervical region: Secondary | ICD-10-CM | POA: Diagnosis not present

## 2018-07-01 ENCOUNTER — Ambulatory Visit (INDEPENDENT_AMBULATORY_CARE_PROVIDER_SITE_OTHER): Payer: BLUE CROSS/BLUE SHIELD | Admitting: Family Medicine

## 2018-07-01 ENCOUNTER — Encounter: Payer: Self-pay | Admitting: Family Medicine

## 2018-07-01 VITALS — BP 143/104 | HR 78 | Resp 17 | Ht 65.0 in | Wt 157.0 lb

## 2018-07-01 DIAGNOSIS — I10 Essential (primary) hypertension: Secondary | ICD-10-CM | POA: Diagnosis not present

## 2018-07-01 DIAGNOSIS — Z1329 Encounter for screening for other suspected endocrine disorder: Secondary | ICD-10-CM

## 2018-07-01 DIAGNOSIS — R739 Hyperglycemia, unspecified: Secondary | ICD-10-CM

## 2018-07-01 DIAGNOSIS — D72829 Elevated white blood cell count, unspecified: Secondary | ICD-10-CM | POA: Diagnosis not present

## 2018-07-01 DIAGNOSIS — Z7689 Persons encountering health services in other specified circumstances: Secondary | ICD-10-CM

## 2018-07-01 DIAGNOSIS — M5412 Radiculopathy, cervical region: Secondary | ICD-10-CM | POA: Diagnosis not present

## 2018-07-01 MED ORDER — GABAPENTIN 300 MG PO CAPS: 300.0000 mg | ORAL_CAPSULE | Freq: Four times a day (QID) | ORAL | 1 refills | Status: DC

## 2018-07-01 NOTE — Progress Notes (Signed)
[name], is a 53 y.o. female  OZH:[id number]  GEX:[id number]  DOB - 06-Sep-1965  CC:  Chief Complaint  Patient presents with  . Establish Care  . Hospitalization Follow-up    ED->Hosp 1/15-1/17: syncope & collapse, paresthesia of upper extremity, neck pain       HPI: [name] is a 53 y.o. female is here today to establish care and hospital follow-up.  [name] has Encounter for health maintenance examination in adult; History of migraine headaches; Tobacco use; Vitamin D deficiency; Family history of colon cancer; History of substance abuse ([place]); Screen for STD (sexually transmitted disease); Syncope; and Acute neck pain on their problem list.    Patient with a history of polysubstance abuse,  was admitted to [facility] on 06/09/2018 after experiencing a syncopal episode.  Patient reports after smoking a substance which she personally rolled herself experienced a hot flash and passed out.  She suffered from a fall and sustained injury to her right shoulder and neck.  Patient denied any shortness of breath or chest pain prior to syncopal-like episode.  She underwent an MRI of the neck which showed some moderate to severe neural foraminal stenosis with edema at C2-3. She was started on Gabapentin and steroids and referred to neurosurgery for outpatient follow-up. She has an upcoming appointment scheduled with Dr. Kathyrn Sheriff in a few weeks for "neck injection". She continues to complain mostly of neck pain. Mild pain relief achieved with Gabapentin taken  TID. She has ROM of neck and shoulder although notes tenderness with movement. She has completed prednisone. Denies any further syncopal episodes.  Patient was noted to have uncontrolled untreated hypertension during hospital admission.  Patient was started on amlodipine and lisinopril was continued at discharge however patient reports that she discontinued taking the amlodipine and lisinopril due to liking the way the medication made  her feel.  She is not monitoring her blood pressure at home.  Denies sodium restrictions of diet.  She is a chronic smoker of marijuana.  Denies any shortness of breath or lower extremity swelling.   Current medications: Current Outpatient Medications:  .  gabapentin (NEURONTIN) 300 MG capsule, Take 1 capsule by mouth 3 (three) times daily., Disp: , Rfl:  .  amLODipine (NORVASC) 10 MG tablet, Take 1 tablet (10 mg total) by mouth daily. (Patient not taking: Reported on 07/01/2018), Disp: 30 tablet, Rfl: 0 .  lisinopril (PRINIVIL,ZESTRIL) 20 MG tablet, Take 1 tablet (20 mg total) by mouth daily. (Patient not taking: Reported on 07/01/2018), Disp: 30 tablet, Rfl: 0   Pertinent family medical history: family history includes Colon cancer (age of onset: 25) in her mother; Diabetes in her brother and mother; Hypertension in her brother, father, and mother; Obesity in her brother; Stroke in her father.   No Known Allergies  Social History   Socioeconomic History  . Marital status: Single    Spouse name: Not on file  . Number of children: Not on file  . Years of education: Not on file  . Highest education level: Not on file  Occupational History  . Not on file  Social Needs  . Financial resource strain: Not on file  . Food insecurity:    Worry: Not on file    Inability: Not on file  . Transportation needs:    Medical: Not on file    Non-medical: Not on file  Tobacco Use  . Smoking status: Current Every Day Smoker    Packs/day: 0.50    Years:  10.00    Pack years: 5.00    Types: Cigarettes  . Smokeless tobacco: Never Used  Substance and Sexual Activity  . Alcohol use: No    Comment: .   Marland Kitchen Drug use: Yes    Types: Marijuana    Comment: Quit Coccaine and marijuana in 2009; denies  . Sexual activity: Not on file  Lifestyle  . Physical activity:    Days per week: Not on file    Minutes per session: Not on file  . Stress: Not on file  Relationships  . Social connections:    Talks on  phone: Not on file    Gets together: Not on file    Attends religious service: Not on file    Active member of club or organization: Not on file    Attends meetings of clubs or organizations: Not on file    Relationship status: Not on file  . Intimate partner violence:    Fear of current or ex partner: Not on file    Emotionally abused: Not on file    Physically abused: Not on file    Forced sexual activity: Not on file  Other Topics Concern  . Not on file  Social History Narrative   Lives in [place] with her mom, single, never married, no kids.  Works at [employer] with [employer] and [employer] x 6 years.  Sleeps a lot, works 3 rd shift .  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    Review of Systems: Constitutional: Negative for fever, chills, diaphoresis, activity change, appetite change and fatigue. Respiratory: Negative for cough, choking, chest tightness, shortness of breath, wheezing and stridor.  Cardiovascular: Negative for chest pain, palpitations and leg swelling. Musculoskeletal: Neck pain generalized achiness Neurological: Negative for dizziness, tremors, seizures, syncope, facial asymmetry, speech difficulty, weakness, light-headedness, numbness and headaches.  Hematological: Negative for adenopathy. Does not bruise/bleed easily. Psychiatric/Behavioral: Negative for hallucinations, behavioral problems, confusion, dysphoric mood, decreased concentration and agitation.    Objective:   Vitals:   07/01/18 1359  BP: (!) 143/104  Pulse: 78  Resp: 17  SpO2: 98%    BP Readings from Last 3 Encounters:  07/01/18 (!) 143/104  06/11/18 (!) 142/84  04/01/18 (!) 130/96    Filed Weights   07/01/18 1359  Weight: 157 lb (71.2 kg)      Physical Exam: Constitutional: Patient appears well-developed and well-nourished. No distress. HENT: Normocephalic, atraumatic, External right and left ear normal. Oropharynx is clear and moist.  Eyes: Conjunctivae and EOM are  normal. PERRLA, no scleral icterus. Neck: Normal ROM. Neck supple. No JVD. No tracheal deviation. No thyromegaly. CVS: RRR, S1/S2 +, no murmurs, no gallops, no carotid bruit.  Pulmonary: Effort and breath sounds normal, no stridor, rhonchi, wheezes, rales.  Abdominal: Soft. BS +, no distension, tenderness, rebound or guarding.  Musculoskeletal: Diminished range of motion of the cervical spine.  Tenderness noted with rotational movements of the cervical spine.  Neuro: Alert. Normal muscle tone coordination. Normal gait.  Bilateral hand grips symmetrical. Skin: Skin is warm and dry. No rash noted. Not diaphoretic. No erythema. No pallor. Psychiatric: Normal mood and affect. Behavior, judgment, thought content normal.  Lab Results (prior encounters)  Lab Results  Component Value Date   WBC 12.4 (H) 06/10/2018   HGB 14.7 06/10/2018   HCT 44.8 06/10/2018   MCV 96.8 06/10/2018   PLT 203 06/10/2018   Lab Results  Component Value Date   CREATININE 1.21 (H) 06/10/2018  BUN 10 06/10/2018   NA 140 06/10/2018   K 3.8 06/10/2018   CL 109 06/10/2018   CO2 21 (L) 06/10/2018    Lab Results  Component Value Date   HGBA1C 6.0 (H) 07/26/2015       Component Value Date/Time   CHOL 178 07/26/2015 0001   TRIG 59 07/26/2015 0001   HDL 68 07/26/2015 0001   CHOLHDL 2.6 07/26/2015 0001   VLDL 12 07/26/2015 0001   LDLCALC 98 07/26/2015 0001        Assessment and plan:  1. Encounter to establish care  2. Essential hypertension, uncontrolled Will resume amlodipine 10 mg daily only.  Will hold off on resuming lisinopril.  We will have patient return in 2 weeks for blood pressure check. -DASH diet encouraged. - Thyroid Panel With TSH - Comprehensive metabolic panel  3. Hyperglycemia - Hemoglobin A1c  4. Leukocytosis, unspecified type - CBC  5. Screening for thyroid disorder Checking TSH panel  6. Cervical radicular pain Currently managed by neurosurgery.  Patient has an upcoming  appointment scheduled. In the meantime for pain I would recommend increasing gabapentin 300 mg 4 times daily to assist with pain management.   Return in about 2 weeks (around 07/15/2018) for BP check and lipid panel, and 6 weeks visit.   The patient was given clear instructions to go to ER or return to medical center if symptoms don't improve, worsen or new problems develop. The patient verbalized understanding. The patient was advised  to call and obtain lab results if they haven't heard anything from out office within 7-10 business days.  Molli Barrows, FNP Primary Care at [facility] [address], [place] [address] [phone]: [phone]    This note has been created with Dragon speech recognition software and Engineer, materials. Any transcriptional errors are unintentional.

## 2018-07-01 NOTE — Patient Instructions (Addendum)
Thank you for choosing Primary Care at [facility] to be your medical home!    [name] was seen by Megan Barrows, FNP today.   [name]'s primary care provider is Megan Jun, FNP.   For the best care possible, you should try to see Megan Barrows, FNP-C whenever you come to the clinic.   We look forward to seeing you again soon!  If you have any questions about your visit today, please call us at [phone] or feel free to reach your primary care provider via [place].    Please follow up in 2 weeks for BP check and please fast the night before your appointment for cholesterol check as well I am restarting you only on your amlodipine 10mg  daily, please do not discard your lisinopril I am increasing your gabapentin to four times daily  You will follow up for routine in office visit in 6 weeks   Hypertension Hypertension is another name for high blood pressure. High blood pressure forces your heart to work harder to pump blood. This can cause problems over time. There are two numbers in a blood pressure reading. There is a top number (systolic) over a bottom number (diastolic). It is best to have a blood pressure below 120/80. Healthy choices can help lower your blood pressure. You may need medicine to help lower your blood pressure if:  Your blood pressure cannot be lowered with healthy choices.  Your blood pressure is higher than 130/80. Follow these instructions at home: Eating and drinking   If directed, follow the DASH eating plan. This diet includes: ? Filling half of your plate at each meal with fruits and vegetables. ? Filling one quarter of your plate at each meal with whole grains. Whole grains include whole wheat pasta, brown rice, and whole grain bread. ? Eating or drinking low-fat dairy products, such as skim milk or low-fat yogurt. ? Filling one quarter of your plate at each meal with low-fat (lean) proteins. Low-fat proteins include fish,  skinless chicken, eggs, beans, and tofu. ? Avoiding fatty meat, cured and processed meat, or chicken with skin. ? Avoiding premade or processed food.  Eat less than 1,500 mg of salt (sodium) a day.  Limit alcohol use to no more than 1 drink a day for nonpregnant women and 2 drinks a day for men. One drink equals 12 oz of beer, 5 oz of wine, or 1 oz of hard liquor. Lifestyle  Work with your doctor to stay at a healthy weight or to lose weight. Ask your doctor what the best weight is for you.  Get at least 30 minutes of exercise that causes your heart to beat faster (aerobic exercise) most days of the week. This may include walking, swimming, or biking.  Get at least 30 minutes of exercise that strengthens your muscles (resistance exercise) at least 3 days a week. This may include lifting weights or pilates.  Do not use any products that contain nicotine or tobacco. This includes cigarettes and e-cigarettes. If you need help quitting, ask your doctor.  Check your blood pressure at home as told by your doctor.  Keep all follow-up visits as told by your doctor. This is important. Medicines  Take over-the-counter and prescription medicines only as told by your doctor. Follow directions carefully.  Do not skip doses of blood pressure medicine. The medicine does not work as well if you skip doses. Skipping doses also puts you at risk for problems.  Ask your  doctor about side effects or reactions to medicines that you should watch for. Contact a doctor if:  You think you are having a reaction to the medicine you are taking.  You have headaches that keep coming back (recurring).  You feel dizzy.  You have swelling in your ankles.  You have trouble with your vision. Get help right away if:  You get a very bad headache.  You start to feel confused.  You feel weak or numb.  You feel faint.  You get very bad pain in your: ? Chest. ? Belly (abdomen).  You throw up (vomit) more  than once.  You have trouble breathing. Summary  Hypertension is another name for high blood pressure.  Making healthy choices can help lower blood pressure. If your blood pressure cannot be controlled with healthy choices, you may need to take medicine. This information is not intended to replace advice given to you by your health care provider. Make sure you discuss any questions you have with your health care provider. Document Released: 10/29/2007 Document Revised: 04/09/2016 Document Reviewed: 04/09/2016 Elsevier Interactive Patient Education  2019 [employer].

## 2018-07-01 NOTE — Progress Notes (Deleted)
Patient ID: [name], female    DOB: 05/05/1966, 53 y.o.   MRN: [id number]  PCP: Scot Jun, FNP  Chief Complaint  Patient presents with  . Establish Care  . Hospitalization Follow-up    ED->Hosp 1/15-1/17: syncope & collapse, paresthesia of upper extremity, neck pain    Subjective:  HPI  [name] is a 53 y.o. female presents for hospital follow-up and to establish care. She present to the hospital on 06/09/2018 for syncope and collapse, with a fall that caused neck and bilateral arm pain.She endorses that neck pain and bilateral arm pain has continued and she has received minimal relief from the prescribed gabapentin. She endorses pain is 8/10 with movement and tender to touch. MRI of cervical spine was obtained 06/10/2018 and showed some edema of the C2-C3 She was worked up and ruled out for cardiac causes of syncopal episode, and echocardiogram was negative with an EF of 65-70%. When discharged from the hospital patient she was started on lisinopril 20 mg daily and norvasc 10 mg daily for blood pressure control, during the hospital course her blood pressure readings were elevated at 161/98, 159/103, and 142/84. She denies any history of hypertension or elevated blood pressure. She endorses that she smokes marijauana and last used cocaine 3 days prior to syncopal episode 06/09/2018. She endorses that she has since quit cocaine but continue to smoke marijuanua for "migraines relief". She endorses a long history of migraines without nausea and vomiting. She denies dizziness, lightheadedness, chest pain, palpitations or shortness of breath.   Social History   Socioeconomic History  . Marital status: Single    Spouse name: Not on file  . Number of children: Not on file  . Years of education: Not on file  . Highest education level: Not on file  Occupational History  . Not on file  Social Needs  . Financial resource strain: Not on file  . Food insecurity:    Worry: Not on  file    Inability: Not on file  . Transportation needs:    Medical: Not on file    Non-medical: Not on file  Tobacco Use  . Smoking status: Current Every Day Smoker    Packs/day: 0.50    Years: 10.00    Pack years: 5.00    Types: Cigarettes  . Smokeless tobacco: Never Used  Substance and Sexual Activity  . Alcohol use: No    Comment: .   Marland Kitchen Drug use: Yes    Types: Marijuana    Comment: Quit Coccaine and marijuana in 2009; denies  . Sexual activity: Not on file  Lifestyle  . Physical activity:    Days per week: Not on file    Minutes per session: Not on file  . Stress: Not on file  Relationships  . Social connections:    Talks on phone: Not on file    Gets together: Not on file    Attends religious service: Not on file    Active member of club or organization: Not on file    Attends meetings of clubs or organizations: Not on file    Relationship status: Not on file  . Intimate partner violence:    Fear of current or ex partner: Not on file    Emotionally abused: Not on file    Physically abused: Not on file    Forced sexual activity: Not on file  Other Topics Concern  . Not on file  Social History Narrative  Lives in [place] with her mom, single, never married, no kids.  Works at [employer] with [employer] and [employer] x 6 years.  Sleeps a lot, works 3 rd shift .  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    Family History  Problem Relation Age of Onset  . Diabetes Mother   . Hypertension Mother   . Colon cancer Mother 2  . Hypertension Father   . Stroke Father   . Diabetes Brother   . Obesity Brother   . Hypertension Brother   . Heart disease Neg Hx      Review of Systems  Constitutional: Negative for appetite change, chills, fatigue and fever.  Respiratory: Negative for cough, chest tightness, shortness of breath and wheezing.   Cardiovascular: Negative for chest pain, palpitations and leg swelling.  Musculoskeletal: Positive for neck pain.        Posterior and lateral neck pain  Neurological: Positive for numbness and headaches. Negative for dizziness, syncope and light-headedness.       History of migraines,  Endorses numbness and tingling pain in bilateral arms radiating from neck    Patient Active Problem List   Diagnosis Date Noted  . Syncope 06/10/2018  . Acute neck pain 06/10/2018  . Screen for STD (sexually transmitted disease) 05/07/2016  . Encounter for health maintenance examination in adult 07/26/2015  . History of migraine headaches 07/26/2015  . Tobacco use 07/26/2015  . Vitamin D deficiency 07/26/2015  . Family history of colon cancer 07/26/2015  . History of substance abuse ([place]) 07/26/2015    No Known Allergies  Prior to Admission medications   Medication Sig Start Date End Date Taking? Authorizing Provider  gabapentin (NEURONTIN) 300 MG capsule Take 1 capsule (300 mg total) by mouth 4 (four) times daily. 07/01/18  Yes Scot Jun, FNP  amLODipine (NORVASC) 10 MG tablet Take 1 tablet (10 mg total) by mouth daily. Patient not taking: Reported on 07/01/2018 06/12/18   Geradine Girt, DO  lisinopril (PRINIVIL,ZESTRIL) 20 MG tablet Take 1 tablet (20 mg total) by mouth daily. Patient not taking: Reported on 07/01/2018 06/12/18   Geradine Girt, DO    Past Medical, Surgical Family and Social History reviewed and updated.    Objective:   Today's Vitals   07/01/18 1359  BP: (!) 143/104  Pulse: 78  Resp: 17  SpO2: 98%  Weight: 157 lb (71.2 kg)  Height: 5\' 5"  (1.651 m)    Wt Readings from Last 3 Encounters:  07/01/18 157 lb (71.2 kg)  06/11/18 141 lb 15.6 oz (64.4 kg)  01/12/17 163 lb (73.9 kg)     Physical Exam Constitutional:      Appearance: Normal appearance. She is normal weight.  HENT:     Head: Normocephalic and atraumatic.  Neck:     Musculoskeletal: Decreased range of motion. Pain with movement, spinous process tenderness and muscular tenderness present. No edema or erythema.      Trachea: Trachea normal.     Comments: Posterior cervical vertebral tenderness with palpation.  Musculoskeletal:     Right shoulder: She exhibits decreased range of motion and tenderness.     Left shoulder: She exhibits decreased range of motion and tenderness.     Comments: Bilateral arm pain that extends from neck and travels down arm with movement.   Lymphadenopathy:     Cervical: No cervical adenopathy.  Neurological:     Mental Status: She is alert.     No  results found for: POCGLU  Lab Results  Component Value Date   HGBA1C 6.0 (H) 07/26/2015            Assessment & Plan:  1. Encounter to establish care Pt will follow up in  6 weeks  2. Essential hypertension - Thyroid Panel With TSH - Comprehensive metabolic panel -Educated on [employer] Norvasc 10 mg restarted and return to clinic in 2 weeks for BP check and lipid panel  3. Hyperglycemia - Hemoglobin A1c  4. Leukocytosis, unspecified type - CBC  5. Screening for thyroid disorder - Thyroid panel  6. Cervical radicular pain Gabapentin increased to four times daily, and pt scheduled to have an injection on 07/07/18 for neck  A total of 20 minutes spent, greater than 50 % of this time was spent assessing, counseling and coordination of care.   If symptoms worsen or do not improve, return for follow-up, follow-up with PCP, or at the emergency department if severity of symptoms warrant a higher level of care.     Festus Aloe, FNP-S Primary Care at [facility] [address] [place]: [phone]

## 2018-07-02 LAB — COMPREHENSIVE METABOLIC PANEL
ALT: 12 IU/L (ref 0–32)
AST: 20 IU/L (ref 0–40)
Albumin/Globulin Ratio: 1.9 (ref 1.2–2.2)
Albumin: 4.6 g/dL (ref 3.8–4.9)
Alkaline Phosphatase: 135 IU/L — ABNORMAL HIGH (ref 39–117)
BUN/Creatinine Ratio: 11 (ref 9–23)
BUN: 12 mg/dL (ref 6–24)
Bilirubin Total: <0.2 mg/dL (ref 0.0–1.2)
CO2: 22 mmol/L (ref 20–29)
Calcium: 9.9 mg/dL (ref 8.7–10.2)
Chloride: 108 mmol/L — ABNORMAL HIGH (ref 96–106)
Creatinine, Ser: 1.11 mg/dL — ABNORMAL HIGH (ref 0.57–1.00)
GFR calc Af Amer: 66 mL/min/{1.73_m2} (ref >59)
GFR calc non Af Amer: 57 mL/min/{1.73_m2} — ABNORMAL LOW (ref >59)
GLUCOSE: 83 mg/dL (ref 65–99)
Globulin, Total: 2.4 g/dL (ref 1.5–4.5)
Potassium: 4.5 mmol/L (ref 3.5–5.2)
Sodium: 146 mmol/L — ABNORMAL HIGH (ref 134–144)
Total Protein: 7 g/dL (ref 6.0–8.5)

## 2018-07-02 LAB — CBC
HEMATOCRIT: 43.9 % (ref 34.0–46.6)
HEMOGLOBIN: 14.6 g/dL (ref 11.1–15.9)
MCH: 32.4 pg (ref 26.6–33.0)
MCHC: 33.3 g/dL (ref 31.5–35.7)
MCV: 98 fL — ABNORMAL HIGH (ref 79–97)
Platelets: 267 10*3/uL (ref 150–450)
RBC: 4.5 x10E6/uL (ref 3.77–5.28)
RDW: 13.3 % (ref 11.7–15.4)
WBC: 10.5 10*3/uL (ref 3.4–10.8)

## 2018-07-02 LAB — THYROID PANEL WITH TSH
Free Thyroxine Index: 1.2 (ref 1.2–4.9)
T3 Uptake Ratio: 21 % — ABNORMAL LOW (ref 24–39)
T4, Total: 5.9 ug/dL (ref 4.5–12.0)
TSH: 2.09 u[IU]/mL (ref 0.450–4.500)

## 2018-07-02 LAB — HEMOGLOBIN A1C
ESTIMATED AVERAGE GLUCOSE: 126 mg/dL
Hgb A1c MFr Bld: 6 % — ABNORMAL HIGH (ref 4.8–5.6)

## 2018-07-07 DIAGNOSIS — M5412 Radiculopathy, cervical region: Secondary | ICD-10-CM | POA: Diagnosis not present

## 2018-07-07 DIAGNOSIS — R030 Elevated blood-pressure reading, without diagnosis of hypertension: Secondary | ICD-10-CM | POA: Diagnosis not present

## 2018-07-07 DIAGNOSIS — M4802 Spinal stenosis, cervical region: Secondary | ICD-10-CM | POA: Diagnosis not present

## 2018-07-15 ENCOUNTER — Ambulatory Visit: Payer: BLUE CROSS/BLUE SHIELD

## 2018-07-15 DIAGNOSIS — Z6824 Body mass index (BMI) 24.0-24.9, adult: Secondary | ICD-10-CM | POA: Diagnosis not present

## 2018-07-15 DIAGNOSIS — Z01419 Encounter for gynecological examination (general) (routine) without abnormal findings: Secondary | ICD-10-CM | POA: Diagnosis not present

## 2018-07-15 DIAGNOSIS — Z1231 Encounter for screening mammogram for malignant neoplasm of breast: Secondary | ICD-10-CM | POA: Diagnosis not present

## 2018-07-16 ENCOUNTER — Ambulatory Visit: Payer: BLUE CROSS/BLUE SHIELD

## 2018-07-23 ENCOUNTER — Ambulatory Visit: Payer: BLUE CROSS/BLUE SHIELD

## 2018-07-23 VITALS — BP 143/83 | HR 74

## 2018-07-23 DIAGNOSIS — E559 Vitamin D deficiency, unspecified: Secondary | ICD-10-CM | POA: Diagnosis not present

## 2018-07-23 DIAGNOSIS — Z13220 Encounter for screening for lipoid disorders: Secondary | ICD-10-CM

## 2018-07-24 LAB — LIPID PANEL
Chol/HDL Ratio: 2.3 ratio (ref 0.0–4.4)
Cholesterol, Total: 204 mg/dL — ABNORMAL HIGH (ref 100–199)
HDL: 87 mg/dL (ref >39)
LDL Calculated: 105 mg/dL — ABNORMAL HIGH (ref 0–99)
Triglycerides: 62 mg/dL (ref 0–149)
VLDL Cholesterol Cal: 12 mg/dL (ref 5–40)

## 2018-07-24 LAB — VITAMIN D 25 HYDROXY (VIT D DEFICIENCY, FRACTURES): Vit D, 25-Hydroxy: 25.6 ng/mL — ABNORMAL LOW (ref 30.0–100.0)

## 2018-07-25 MED ORDER — VITAMIN D (ERGOCALCIFEROL) 1.25 MG (50000 UNIT) PO CAPS: 50000.0000 [IU] | ORAL_CAPSULE | ORAL | 0 refills | Status: DC

## 2018-07-25 NOTE — Progress Notes (Signed)
Ergocalciferol prescribed -vitamin d deficiency

## 2018-07-28 DIAGNOSIS — R030 Elevated blood-pressure reading, without diagnosis of hypertension: Secondary | ICD-10-CM | POA: Diagnosis not present

## 2018-07-28 DIAGNOSIS — M5412 Radiculopathy, cervical region: Secondary | ICD-10-CM | POA: Diagnosis not present

## 2018-07-28 DIAGNOSIS — M4802 Spinal stenosis, cervical region: Secondary | ICD-10-CM | POA: Diagnosis not present

## 2018-08-12 ENCOUNTER — Encounter: Payer: Self-pay | Admitting: Family Medicine

## 2018-08-12 ENCOUNTER — Telehealth: Payer: Self-pay | Admitting: *Deleted

## 2018-08-12 ENCOUNTER — Ambulatory Visit (INDEPENDENT_AMBULATORY_CARE_PROVIDER_SITE_OTHER): Payer: BLUE CROSS/BLUE SHIELD | Admitting: Family Medicine

## 2018-08-12 ENCOUNTER — Other Ambulatory Visit: Payer: Self-pay

## 2018-08-12 VITALS — BP 152/96 | HR 78 | Temp 97.8°F | Resp 16 | Ht 60.0 in | Wt 157.4 lb

## 2018-08-12 DIAGNOSIS — Z720 Tobacco use: Secondary | ICD-10-CM | POA: Diagnosis not present

## 2018-08-12 DIAGNOSIS — L299 Pruritus, unspecified: Secondary | ICD-10-CM | POA: Diagnosis not present

## 2018-08-12 DIAGNOSIS — I10 Essential (primary) hypertension: Secondary | ICD-10-CM

## 2018-08-12 DIAGNOSIS — E559 Vitamin D deficiency, unspecified: Secondary | ICD-10-CM

## 2018-08-12 DIAGNOSIS — F1290 Cannabis use, unspecified, uncomplicated: Secondary | ICD-10-CM

## 2018-08-12 MED ORDER — HYDROXYZINE HCL 25 MG PO TABS: 25.0000 mg | ORAL_TABLET | Freq: Three times a day (TID) | ORAL | 3 refills | Status: DC | PRN

## 2018-08-12 MED ORDER — GABAPENTIN 300 MG PO CAPS: 300.0000 mg | ORAL_CAPSULE | Freq: Four times a day (QID) | ORAL | 3 refills | Status: DC

## 2018-08-12 MED ORDER — AMLODIPINE BESYLATE 10 MG PO TABS: 10.0000 mg | ORAL_TABLET | Freq: Every day | ORAL | 3 refills | Status: DC

## 2018-08-12 NOTE — Progress Notes (Signed)
Patient ID: [name], female    DOB: 17-Jan-1966, 53 y.o.   MRN: [id number]  PCP: Scot Jun, FNP  Chief Complaint  Patient presents with  . Hypertension    Subjective:  HPI  [name] is a 53 y.o. female presents for hypertension follow-up.  has Encounter for health maintenance examination in adult; History of migraine headaches; Tobacco use; Vitamin D deficiency; Family history of colon cancer; History of substance abuse ([place]); Screen for STD (sexually transmitted disease); Syncope; and Acute neck pain on their problem list.    [name] reports no home monitoring of blood pressure. She is not taking her blood pressure medication as she reports that "she feels fine". She is current cigarette and marijuana smoker although reports she cut back on marijuana use. Blood pressure is elevated in arrival today. Reports that she has medication at home. Denies any episodes of dizziness, syncopal episodes, headaches, shortness of breath, or chest pain.  Complains of skin irritation. Reports that her mother who resides within the same household is experiencing the same issues. Denies any recent changes in detergents or soaps. No animals in the household. She has not visible rash, bilateral upper extremities it. She tried triamcinolone cream without improvement.  Social History   Socioeconomic History  . Marital status: Single    Spouse name: Not on file  . Number of children: Not on file  . Years of education: Not on file  . Highest education level: Not on file  Occupational History  . Not on file  Social Needs  . Financial resource strain: Not on file  . Food insecurity:    Worry: Not on file    Inability: Not on file  . Transportation needs:    Medical: Not on file    Non-medical: Not on file  Tobacco Use  . Smoking status: Current Every Day Smoker    Packs/day: 0.50    Years: 10.00    Pack years: 5.00    Types: Cigarettes  . Smokeless tobacco: Never Used   Substance and Sexual Activity  . Alcohol use: No    Comment: .   Marland Kitchen Drug use: Yes    Types: Marijuana    Comment: Quit Coccaine and marijuana in 2009; denies  . Sexual activity: Not on file  Lifestyle  . Physical activity:    Days per week: Not on file    Minutes per session: Not on file  . Stress: Not on file  Relationships  . Social connections:    Talks on phone: Not on file    Gets together: Not on file    Attends religious service: Not on file    Active member of club or organization: Not on file    Attends meetings of clubs or organizations: Not on file    Relationship status: Not on file  . Intimate partner violence:    Fear of current or ex partner: Not on file    Emotionally abused: Not on file    Physically abused: Not on file    Forced sexual activity: Not on file  Other Topics Concern  . Not on file  Social History Narrative   Lives in [place] with her mom, single, never married, no kids.  Works at [employer] with [name] and [employer] x 6 years.  Sleeps a lot, works 3 rd shift .  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    Family History  Problem Relation Age  of Onset  . Diabetes Mother   . Hypertension Mother   . Colon cancer Mother 27  . Hypertension Father   . Stroke Father   . Diabetes Brother   . Obesity Brother   . Hypertension Brother   . Heart disease Neg Hx    Review of Systems Pertinent negatives listed in HPI No Known Allergies  Prior to Admission medications   Medication Sig Start Date End Date Taking? Authorizing Provider  amLODipine (NORVASC) 10 MG tablet Take 1 tablet (10 mg total) by mouth daily. 06/12/18  Yes Vann, Jessica U, DO  gabapentin (NEURONTIN) 300 MG capsule Take 1 capsule (300 mg total) by mouth 4 (four) times daily. 07/01/18  Yes Scot Jun, FNP  Vitamin D, Ergocalciferol, (DRISDOL) 1.25 MG (50000 UT) CAPS capsule Take 1 capsule (50,000 Units total) by mouth every 7 (seven) days. 07/25/18  Yes Scot Jun, FNP  zolpidem (AMBIEN) 10 MG tablet Take 1 tablet by mouth at bedtime. 07/15/18  Yes [provider]    Past Medical, Surgical Family and Social History reviewed and updated.    Objective:   Today's Vitals   08/12/18 1000  BP: (!) 152/96  Pulse: 78  Resp: 16  Temp: 97.8 F (36.6 C)  TempSrc: Oral  SpO2: 97%  Weight: 157 lb 6.4 oz (71.4 kg)  Height: 5' (1.524 m)  PainSc: 0-No pain    BP Readings from Last 3 Encounters:  08/12/18 (!) 152/96  07/23/18 (!) 143/83  07/01/18 (!) 143/104    Filed Weights   08/12/18 1000  Weight: 157 lb 6.4 oz (71.4 kg)       Physical Exam  General appearance: alert, well developed, well nourished, cooperative and in no distress Head: Normocephalic, without obvious abnormality, atraumatic Respiratory: Respirations even and unlabored, normal respiratory rate Heart: rate and rhythm normal. No gallop or murmurs noted on exam  Abdomen: BS +, no distention, no rebound tenderness, or no mass Extremities: No gross deformities Skin: Skin color, texture, turgor normal. No rashes seen  Psych: Appropriate mood and affect. Neurologic: Mental status: Alert, oriented to person, place, and time, thought content appropriate.  Lab Results  Component Value Date   HGBA1C 6.0 (H) 07/01/2018    Assessment & Plan:  1. Pruritus, no rash or lesions present Trial hydroxyzine 25 mg, 3 times daily as needed for itching.  2. Vitamin D deficiency -Continue vitamin D replacement   3. Essential hypertension, elevated today -reinforced the importance of resuming Amlodipine 10 mg once daily We have discussed target BP range and blood pressure goal. I have advised patient to check BP regularly ( can check at retial location) and to call us back or report to clinic if the numbers are consistently higher than 140/90. We discussed the importance of compliance with medical therapy and DASH diet recommended, consequences of uncontrolled hypertension discussed.    4. Tobacco use -encouraged cessation- not ready to quit  Molli Barrows, FNP Primary Care at [facility] [address] [place] [phone]: [phone]

## 2018-08-12 NOTE — Telephone Encounter (Signed)
Pt came in today for OV. She inquired about lab results.  Pt name and DOB verified. Patient aware of results and result note per Molli Barrows. Pt verbalized understanding.

## 2018-08-12 NOTE — Patient Instructions (Signed)

## 2018-08-12 NOTE — Progress Notes (Deleted)
F/u HTN- has not taken BP medication in 2 weeks  Noticed a rash or skin irritation. She states she is using a cream  that mother was prescribed for same concerns without relief. Triamcinolone cream.

## 2018-10-16 ENCOUNTER — Other Ambulatory Visit: Payer: Self-pay | Admitting: Family Medicine

## 2018-10-20 ENCOUNTER — Other Ambulatory Visit: Payer: Self-pay | Admitting: Family Medicine

## 2018-10-30 DIAGNOSIS — R141 Gas pain: Secondary | ICD-10-CM | POA: Diagnosis not present

## 2018-10-30 DIAGNOSIS — Z20828 Contact with and (suspected) exposure to other viral communicable diseases: Secondary | ICD-10-CM | POA: Diagnosis not present

## 2018-10-30 DIAGNOSIS — R1030 Lower abdominal pain, unspecified: Secondary | ICD-10-CM | POA: Diagnosis not present

## 2018-10-30 DIAGNOSIS — R110 Nausea: Secondary | ICD-10-CM | POA: Diagnosis not present

## 2018-10-30 DIAGNOSIS — E860 Dehydration: Secondary | ICD-10-CM | POA: Diagnosis not present

## 2018-10-30 DIAGNOSIS — K529 Noninfective gastroenteritis and colitis, unspecified: Secondary | ICD-10-CM | POA: Diagnosis not present

## 2018-11-16 ENCOUNTER — Telehealth: Payer: Self-pay

## 2018-11-16 ENCOUNTER — Ambulatory Visit: Payer: BLUE CROSS/BLUE SHIELD | Admitting: Family Medicine

## 2018-11-16 ENCOUNTER — Telehealth: Payer: Self-pay | Admitting: Family Medicine

## 2018-11-16 NOTE — Telephone Encounter (Signed)
Per provider request, called patient to get more information about the stomach pain.

## 2018-11-16 NOTE — Telephone Encounter (Signed)
Patient called back. States that she is having sharp/stabbing mid-abdominal pain x 3-4 weeks. Went to urgent care the beginning of June & was prescribed Zofran. States that she is also having vomiting & diarrhea. Isn't able to keep anything down.

## 2018-11-16 NOTE — Telephone Encounter (Signed)
Spoke with provider & she states to have patient go to the ED.  Patient expressed understanding.

## 2018-11-16 NOTE — Telephone Encounter (Signed)
Patient triaged regarding symptoms of stabbing mid abdominal pain, nausea with vomiting x 3 weeks, and diarrhea. Pain has worsened. Advised CMA to direct patient to the ER not UC given she will likely require imaging. Patient has hx of an appendectomy, concern is for possible cholecystitis.

## 2019-01-25 ENCOUNTER — Other Ambulatory Visit: Payer: Self-pay | Admitting: Family Medicine

## 2019-02-15 ENCOUNTER — Telehealth: Payer: Self-pay

## 2019-02-15 NOTE — Telephone Encounter (Signed)
Called patient to do their pre-visit COVID screening.  Call went to voicemail. Unable to do prescreening.  

## 2019-02-16 ENCOUNTER — Ambulatory Visit: Payer: BLUE CROSS/BLUE SHIELD

## 2019-04-05 IMAGING — DX DG SHOULDER 2+V*R*
2 series · 2 of 2 positions shown · non-contrast
Comparison: None.

CLINICAL DATA: Witnessed syncopal episode today. Shock feeling in
the right shoulder and arm since the fall.

EXAM:
RIGHT SHOULDER - 2+ VIEW

[shoulder grashey]
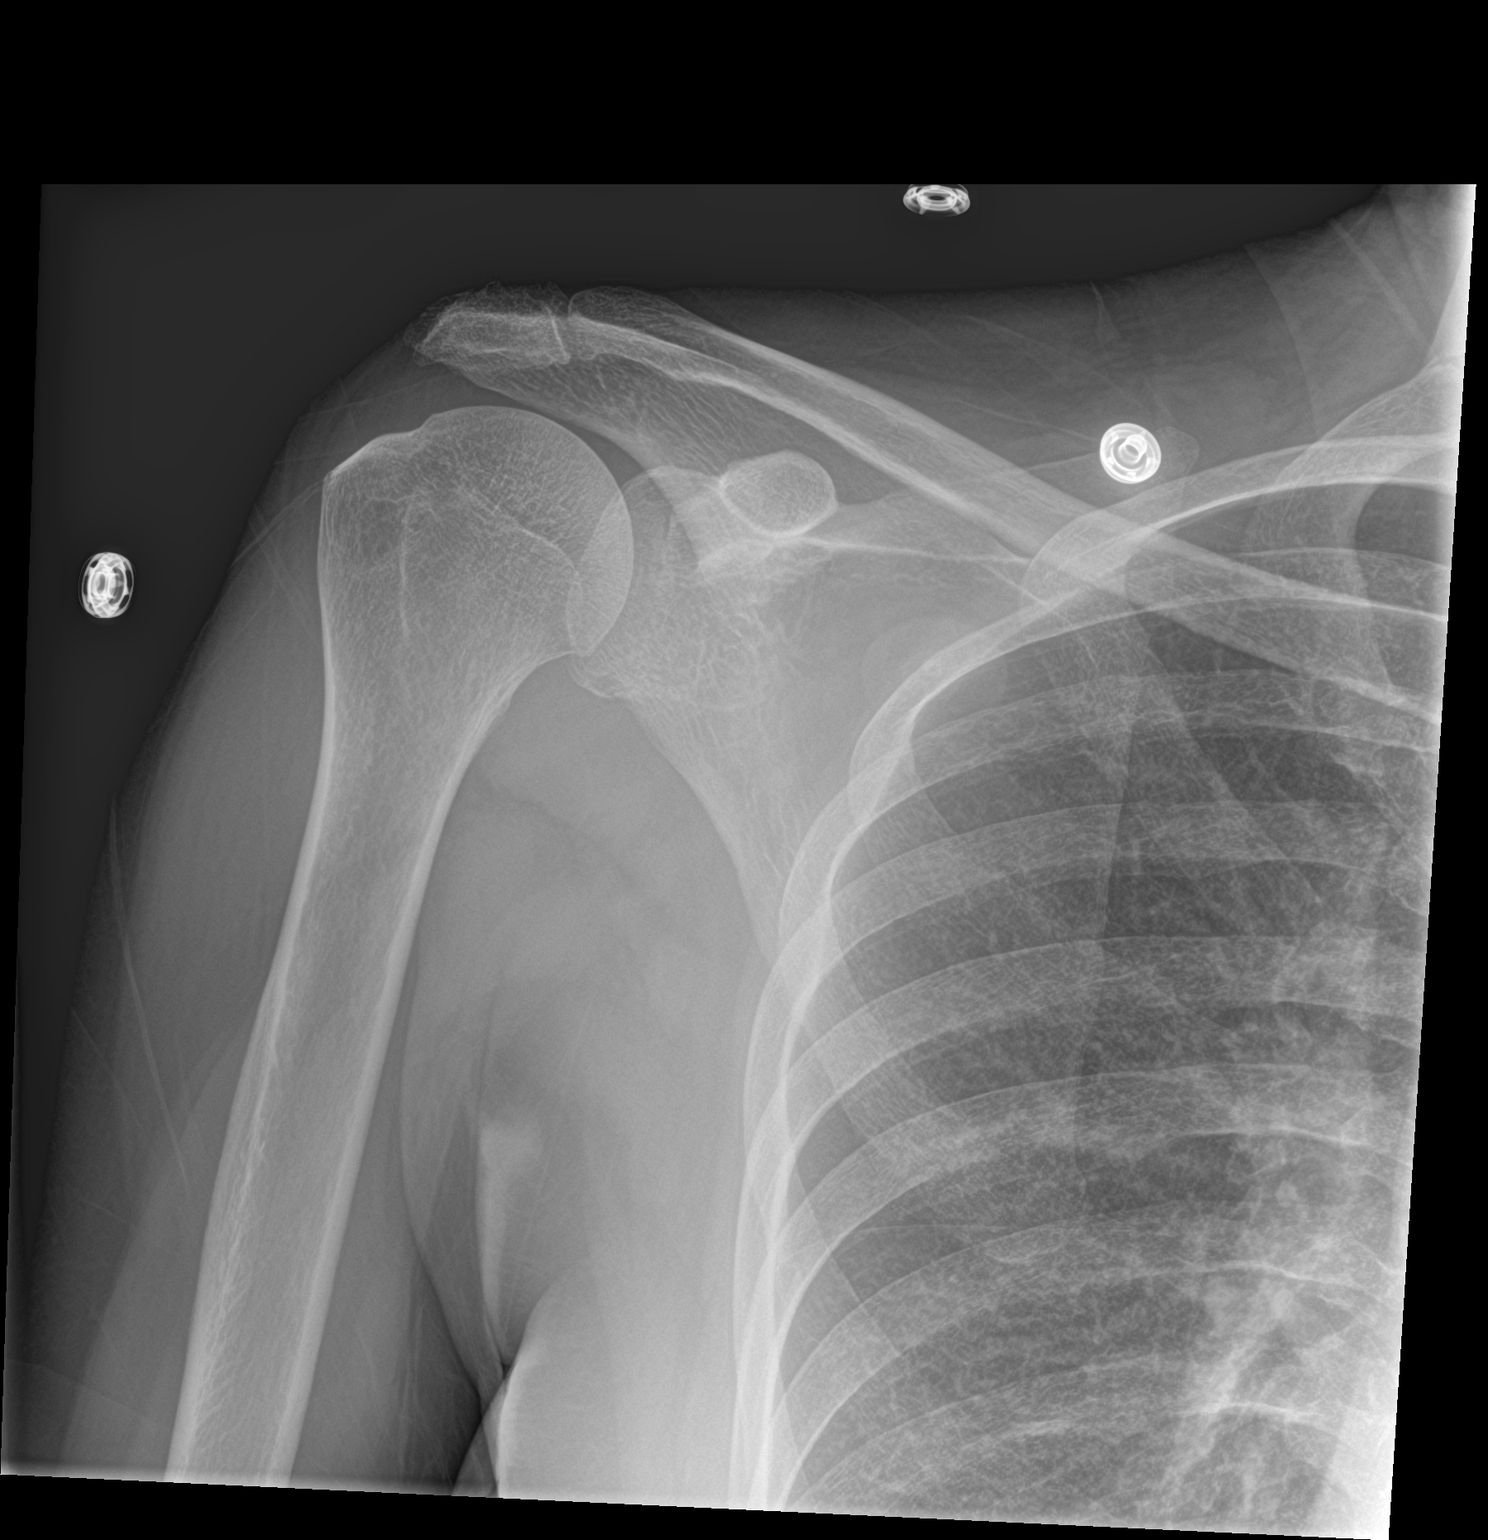

[shoulder y view]
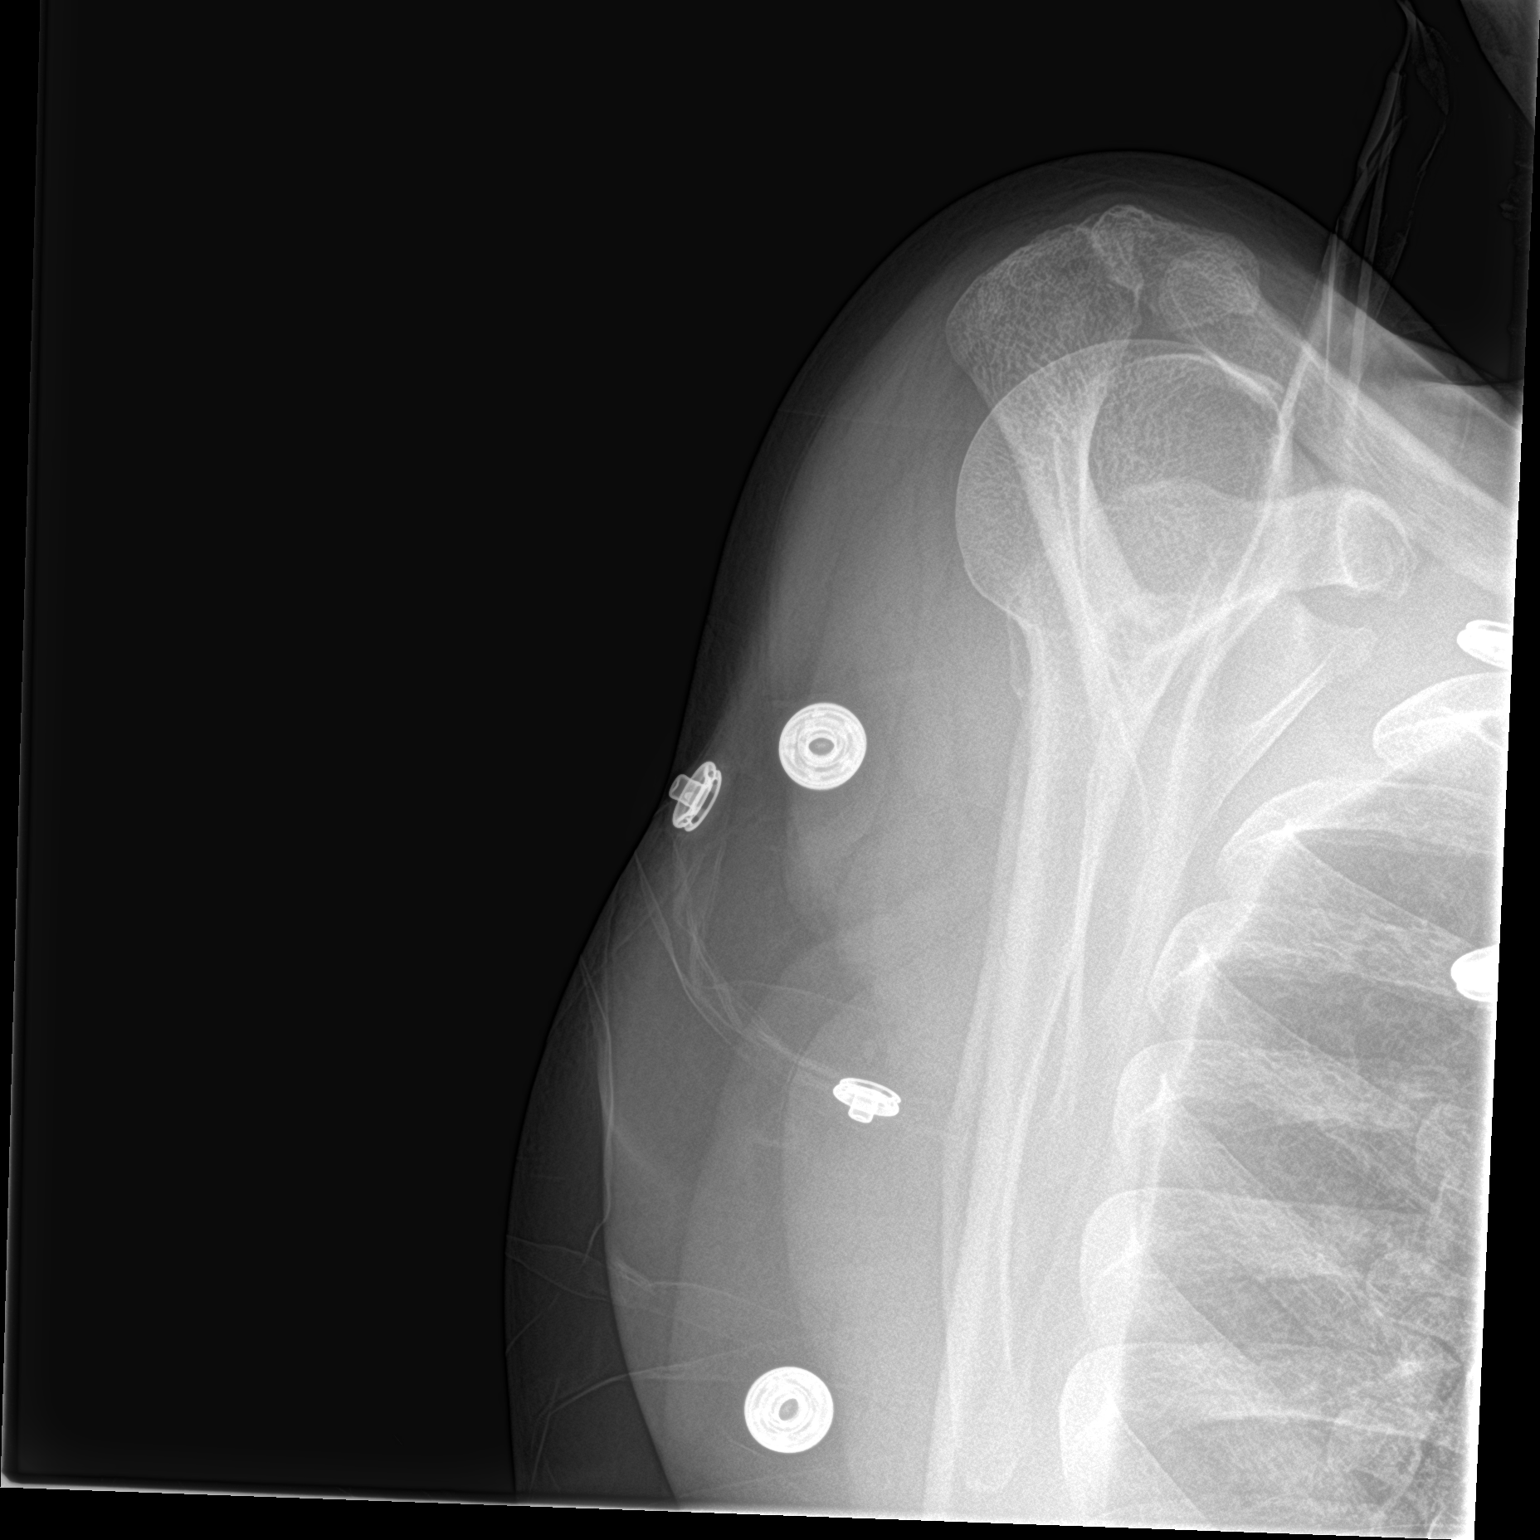

[2 of 2 positions shown; findings below may reference images not displayed]

FINDINGS: There is no evidence of fracture or dislocation. There is no
evidence of arthropathy or other focal bone abnormality. Soft
tissues are unremarkable.
IMPRESSION: Negative.

## 2019-05-12 ENCOUNTER — Other Ambulatory Visit: Payer: Self-pay | Admitting: Family Medicine

## 2019-05-12 MED ORDER — AMLODIPINE BESYLATE 10 MG PO TABS: 10.0000 mg | ORAL_TABLET | Freq: Every day | ORAL | 0 refills | Status: DC

## 2019-05-12 NOTE — Telephone Encounter (Signed)
Sending Vistaril & Gabapentin to covering provider to fill, I'm not able to refill those.

## 2019-05-12 NOTE — Telephone Encounter (Signed)
1) Medication(s) Requested (by name): -amLODipine (NORVASC) 10 MG tablet  -gabapentin (NEURONTIN) 300 MG capsule  -hydrOXYzine (ATARAX/VISTARIL) 25 MG tablet   2) Pharmacy of Choice: -[facility], [place]   3) Special Requests: Until her appt on 06/06/2018

## 2019-05-13 MED ORDER — HYDROXYZINE HCL 25 MG PO TABS: ORAL_TABLET | ORAL | 0 refills | Status: DC

## 2019-05-13 MED ORDER — GABAPENTIN 300 MG PO CAPS: 300.0000 mg | ORAL_CAPSULE | Freq: Four times a day (QID) | ORAL | 0 refills | Status: DC

## 2019-06-06 ENCOUNTER — Telehealth: Payer: Self-pay

## 2019-06-06 NOTE — Telephone Encounter (Signed)
Called patient to do their pre-visit COVID screening.  Call went to voicemail. Unable to do prescreening.  

## 2019-06-07 ENCOUNTER — Other Ambulatory Visit: Payer: Self-pay

## 2019-06-07 ENCOUNTER — Ambulatory Visit (INDEPENDENT_AMBULATORY_CARE_PROVIDER_SITE_OTHER): Payer: BC Managed Care – PPO | Admitting: Family

## 2019-06-07 VITALS — BP 109/77 | HR 77 | Temp 97.0°F | Resp 17 | Wt 159.0 lb

## 2019-06-07 DIAGNOSIS — I10 Essential (primary) hypertension: Secondary | ICD-10-CM | POA: Diagnosis not present

## 2019-06-07 DIAGNOSIS — L298 Other pruritus: Secondary | ICD-10-CM

## 2019-06-07 DIAGNOSIS — M5412 Radiculopathy, cervical region: Secondary | ICD-10-CM | POA: Diagnosis not present

## 2019-06-07 DIAGNOSIS — L299 Pruritus, unspecified: Secondary | ICD-10-CM

## 2019-06-07 DIAGNOSIS — E559 Vitamin D deficiency, unspecified: Secondary | ICD-10-CM

## 2019-06-07 DIAGNOSIS — R112 Nausea with vomiting, unspecified: Secondary | ICD-10-CM

## 2019-06-07 MED ORDER — GABAPENTIN 300 MG PO CAPS: 300.0000 mg | ORAL_CAPSULE | Freq: Four times a day (QID) | ORAL | 0 refills | Status: DC

## 2019-06-07 MED ORDER — AMLODIPINE BESYLATE 10 MG PO TABS: 10.0000 mg | ORAL_TABLET | Freq: Every day | ORAL | 1 refills | Status: DC

## 2019-06-07 MED ORDER — HYDROXYZINE PAMOATE 25 MG PO CAPS: ORAL_CAPSULE | ORAL | 1 refills | Status: DC

## 2019-06-07 MED ORDER — VITAMIN D (ERGOCALCIFEROL) 1.25 MG (50000 UNIT) PO CAPS: 50000.0000 [IU] | ORAL_CAPSULE | ORAL | 0 refills | Status: DC

## 2019-06-07 MED ORDER — ONDANSETRON HCL 4 MG PO TABS: 4.0000 mg | ORAL_TABLET | Freq: Every day | ORAL | 0 refills | Status: DC

## 2019-06-07 NOTE — Patient Instructions (Signed)
Continue Amlodipine. Blood pressure goal < 130/80. DASH diet. Exercise 150 minutes per week. Follow-up in 1 month for annual physical examination and labs.  Hypertension, Adult Hypertension is another name for high blood pressure. High blood pressure forces your heart to work harder to pump blood. This can cause problems over time. There are two numbers in a blood pressure reading. There is a top number (systolic) over a bottom number (diastolic). It is best to have a blood pressure that is below 120/80. Healthy choices can help lower your blood pressure, or you may need medicine to help lower it. What are the causes? The cause of this condition is not known. Some conditions may be related to high blood pressure. What increases the risk?  Smoking.  Having type 2 diabetes mellitus, high cholesterol, or both.  Not getting enough exercise or physical activity.  Being overweight.  Having too much fat, sugar, calories, or salt (sodium) in your diet.  Drinking too much alcohol.  Having long-term (chronic) kidney disease.  Having a family history of high blood pressure.  Age. Risk increases with age.  Race. You may be at higher risk if you are African American.  Gender. Men are at higher risk than women before age 28. After age 47, women are at higher risk than men.  Having obstructive sleep apnea.  Stress. What are the signs or symptoms?  High blood pressure may not cause symptoms. Very high blood pressure (hypertensive crisis) may cause: ? Headache. ? Feelings of worry or nervousness (anxiety). ? Shortness of breath. ? Nosebleed. ? A feeling of being sick to your stomach (nausea). ? Throwing up (vomiting). ? Changes in how you see. ? Very bad chest pain. ? Seizures. How is this treated?  This condition is treated by making healthy lifestyle changes, such as: ? Eating healthy foods. ? Exercising more. ? Drinking less alcohol.  Your health care provider may prescribe  medicine if lifestyle changes are not enough to get your blood pressure under control, and if: ? Your top number is above 130. ? Your bottom number is above 80.  Your personal target blood pressure may vary. Follow these instructions at home: Eating and drinking   If told, follow the DASH eating plan. To follow this plan: ? Fill one half of your plate at each meal with fruits and vegetables. ? Fill one fourth of your plate at each meal with whole grains. Whole grains include whole-wheat pasta, brown rice, and whole-grain bread. ? Eat or drink low-fat dairy products, such as skim milk or low-fat yogurt. ? Fill one fourth of your plate at each meal with low-fat (lean) proteins. Low-fat proteins include fish, chicken without skin, eggs, beans, and tofu. ? Avoid fatty meat, cured and processed meat, or chicken with skin. ? Avoid pre-made or processed food.  Eat less than 1,500 mg of salt each day.  Do not drink alcohol if: ? Your doctor tells you not to drink. ? You are pregnant, may be pregnant, or are planning to become pregnant.  If you drink alcohol: ? Limit how much you use to:  0-1 drink a day for women.  0-2 drinks a day for men. ? Be aware of how much alcohol is in your drink. In the [place] one drink equals one 12 oz bottle of beer (355 mL), one 5 oz glass of wine (148 mL), or one 1 oz glass of hard liquor (44 mL). Lifestyle   Work with your doctor to stay at a  healthy weight or to lose weight. Ask your doctor what the best weight is for you.  Get at least 30 minutes of exercise most days of the week. This may include walking, swimming, or biking.  Get at least 30 minutes of exercise that strengthens your muscles (resistance exercise) at least 3 days a week. This may include lifting weights or doing Pilates.  Do not use any products that contain nicotine or tobacco, such as cigarettes, e-cigarettes, and chewing tobacco. If you need help quitting, ask your doctor.  Check  your blood pressure at home as told by your doctor.  Keep all follow-up visits as told by your doctor. This is important. Medicines  Take over-the-counter and prescription medicines only as told by your doctor. Follow directions carefully.  Do not skip doses of blood pressure medicine. The medicine does not work as well if you skip doses. Skipping doses also puts you at risk for problems.  Ask your doctor about side effects or reactions to medicines that you should watch for. Contact a doctor if you:  Think you are having a reaction to the medicine you are taking.  Have headaches that keep coming back (recurring).  Feel dizzy.  Have swelling in your ankles.  Have trouble with your vision. Get help right away if you:  Get a very bad headache.  Start to feel mixed up (confused).  Feel weak or numb.  Feel faint.  Have very bad pain in your: ? Chest. ? Belly (abdomen).  Throw up more than once.  Have trouble breathing. Summary  Hypertension is another name for high blood pressure.  High blood pressure forces your heart to work harder to pump blood.  For most people, a normal blood pressure is less than 120/80.  Making healthy choices can help lower blood pressure. If your blood pressure does not get lower with healthy choices, you may need to take medicine. This information is not intended to replace advice given to you by your health care provider. Make sure you discuss any questions you have with your health care provider. Document Revised: 01/20/2018 Document Reviewed: 01/20/2018 Elsevier Patient Education  2020 [employer].

## 2019-06-07 NOTE — Progress Notes (Signed)
Patient ID: [name], female    DOB: 1965/07/26  MRN: [id number]  CC: Hypertension (doesn't check BP at home. denies chest pain, SHOB, palpitations, dizziness, lower extremity swelling.) and Vitamin D Deficiency (requesting refill on high dose Vitamin D.)   Subjective: [name] is a 54 y.o. female with a history of hypertension, migraines, vitamin D deficiency, acute neck pain, and tobacco use who presents for medication refills. 1. Hypertension:  Currently prescribed Amlodipine (NORVASC) 1 tablet (10 mg tablet total) by mouth daily. Last seen in office March 2020 by Molli Barrows, FNP. Blood pressure today 109/77. Takes medication daily missing doses on average 2 times per week. Denies side effects of medication. Denies salt restriction in diet. Denies home monitoring. Has home monitor. Denies exercise. Denies shortness of breath unless nauseated. Denies chest pain. Denies dizziness. Denies leg swelling. Admits headaches related to migraines history.  2. Cervical radicular pain:  Currently prescribed Gabapentin (NEURONTIN) 1 capsule (300 mg total) by mouth 4 times daily. Last seen February 2020 by Lavell Anchors, FNP. Denies neck pain. Admits muscle spasms of back and left arm tingling sometimes. Was referred to neurosurgery in January 2020 by Dr. Eliseo Squires.  3. Vitamin D Deficiency:  Admits to taking medication as ordered. Currently prescribed Vitamin D Ergocalciferol (DRISDOL) take 1 capsule (50,000 Units total) by mouth every 7 (seven) days. 4. Itching: Admits itching on back and stomach. Currently prescribed Hydroxyzine (ATARAX/VISTARIL) take 1 tablet by mouth (25 mg total) every 8 hours as needed. 5. Nausea and Vomiting:  Nausea began in 2020 at the beginning of the pandemic when masks were mandated. Removing mask makes nausea better. Hot flashes make nausea worse. Greasy food makes nausea worse. Drinking a Sprite makes nausea better. Vomits at least 3 or 4 days per week.   Patient  Active Problem List   Diagnosis Date Noted  . Syncope 06/10/2018  . Acute neck pain 06/10/2018  . Screen for STD (sexually transmitted disease) 05/07/2016  . Encounter for health maintenance examination in adult 07/26/2015  . History of migraine headaches 07/26/2015  . Tobacco use 07/26/2015  . Vitamin D deficiency 07/26/2015  . Family history of colon cancer 07/26/2015  . History of substance abuse ([place]) 07/26/2015     Current Outpatient Medications on File Prior to Visit  Medication Sig Dispense Refill  . hydrOXYzine (ATARAX/VISTARIL) 25 MG tablet TAKE ONE TABLET BY MOUTH EVERY 8 HOURS AS NEEDED FOR ITCHING 30 tablet 0  . zolpidem (AMBIEN) 10 MG tablet Take 1 tablet by mouth at bedtime.     No current facility-administered medications on file prior to visit.    No Known Allergies  Social History   Socioeconomic History  . Marital status: Single    Spouse name: Not on file  . Number of children: Not on file  . Years of education: Not on file  . Highest education level: Not on file  Occupational History  . Not on file  Tobacco Use  . Smoking status: Current Every Day Smoker    Packs/day: 0.50    Years: 10.00    Pack years: 5.00    Types: Cigarettes  . Smokeless tobacco: Never Used  Substance and Sexual Activity  . Alcohol use: No    Comment: .   Marland Kitchen Drug use: Yes    Types: Marijuana    Comment: Quit Coccaine and marijuana in 2009; denies  . Sexual activity: Not on file  Other Topics Concern  . Not on file  Social  History Narrative   Lives in [place] with her mom, single, never married, no kids.  Works at [employer] with [employer] and [employer] x 6 years.  Sleeps a lot, works 3 rd shift .  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   Social Determinants of Health   Financial Resource Strain:   . Difficulty of Paying Living Expenses: Not on file  Food Insecurity:   . Worried About Charity fundraiser in the Last Year: Not on file  . Ran Out of Food in the  Last Year: Not on file  Transportation Needs:   . Lack of Transportation (Medical): Not on file  . Lack of Transportation (Non-Medical): Not on file  Physical Activity:   . Days of Exercise per Week: Not on file  . Minutes of Exercise per Session: Not on file  Stress:   . Feeling of Stress : Not on file  Social Connections:   . Frequency of Communication with Friends and Family: Not on file  . Frequency of Social Gatherings with Friends and Family: Not on file  . Attends Religious Services: Not on file  . Active Member of Clubs or Organizations: Not on file  . Attends Archivist Meetings: Not on file  . Marital Status: Not on file  Intimate Partner Violence:   . Fear of Current or Ex-Partner: Not on file  . Emotionally Abused: Not on file  . Physically Abused: Not on file  . Sexually Abused: Not on file    Family History  Problem Relation Age of Onset  . Diabetes Mother   . Hypertension Mother   . Colon cancer Mother 63  . Hypertension Father   . Stroke Father   . Diabetes Brother   . Obesity Brother   . Hypertension Brother   . Heart disease Neg Hx     Past Surgical History:  Procedure Laterality Date  . [place] CYST EXCISION  2015  . BILATERAL SALPINGECTOMY Bilateral 07/18/2014   Procedure: BILATERAL SALPINGECTOMY;  Surgeon: Luz Lex, MD;  Location: [facility];  Service: Gynecology;  Laterality: Bilateral;  . Close reduction of bimalleolar ankle fracture     left  . LAPAROSCOPIC APPENDECTOMY  07/10/2011   Procedure: APPENDECTOMY LAPAROSCOPIC;  Surgeon: Rolm Bookbinder, MD;  Location: [facility];  Service: General;  Laterality: N/A;  . LAPAROSCOPY N/A 07/18/2014   Procedure: LAPAROSCOPY OPERATIVE WITH ENDOCATCH;  Surgeon: Luz Lex, MD;  Location: [facility];  Service: Gynecology;  Laterality: N/A;  . LYSIS OF ADHESION N/A 07/18/2014   Procedure: LYSIS OF ADHESION;  Surgeon: Luz Lex, MD;  Location: [facility];  Service: Gynecology;  Laterality: N/A;     ROS: Constitutional: Denies fever. Denies fatigue. Denies weight change. Cardiovascular: Denies chest pain. Denies chest discomfort. Denies palpitations. Denies murmurs Respiratory: Denies shortness of breath unless nauseated. Denies cough. Denies wheezing. Negative except as stated above  PHYSICAL EXAM: BP 109/77   Pulse 77   Temp (!) 97 F (36.1 C) (Temporal)   Resp 17   Wt 159 lb (72.1 kg)   LMP 12/24/2012   SpO2 96%   BMI 31.05 kg/m   Physical Exam General appearance - alert, well appearing, and in no distress and oriented to person, place, and time Neck - supple, no significant adenopathy Chest - clear to auscultation, no wheezes, rales or rhonchi, symmetric air entry, no tachypnea, retractions or cyanosis Heart - normal rate, regular rhythm, normal S1, S2, no murmurs,  rubs, clicks or gallops, normal rate and regular rhythm, S1 and S2 normal, no murmurs noted, no gallops noted Abdomen - soft, nontender, nondistended, no masses or organomegaly bowel sounds normal Neurological - alert, oriented, normal speech, no focal findings or movement disorder noted, neck supple without rigidity, cranial nerves II through XII intact, DTR's normal and symmetric, motor and sensory grossly normal bilaterally, normal muscle tone, no tremors, strength 5/5 Musculoskeletal - no joint tenderness, deformity or swelling, no muscular tenderness noted, full range of motion without pain Extremities - peripheral pulses normal, no pedal edema, no clubbing or cyanosis, no pedal edema noted, intact peripheral pulses, feet normal, good pulses, normal color, temperature and sensation, no edema, redness or tenderness in the calves or thighs   CMP Latest Ref Rng & Units 07/01/2018 06/10/2018 06/09/2018  Glucose 65 - 99 mg/dL 83 115(H) 115(H)  BUN 6 - 24 mg/dL 12 10 11   Creatinine 0.57 - 1.00 mg/dL 1.11(H) 1.21(H) 1.37(H)  Sodium 134 - 144 mmol/L 146(H) 140 141  Potassium 3.5 - 5.2 mmol/L 4.5 3.8 3.9   Chloride 96 - 106 mmol/L 108(H) 109 108  CO2 20 - 29 mmol/L 22 21(L) 21(L)  Calcium 8.7 - 10.2 mg/dL 9.9 9.4 9.2  Total Protein 6.0 - 8.5 g/dL 7.0 - -  Total Bilirubin 0.0 - 1.2 mg/dL <0.2 - -  Alkaline Phos 39 - 117 IU/L 135(H) - -  AST 0 - 40 IU/L 20 - -  ALT 0 - 32 IU/L 12 - -   Lipid Panel     Component Value Date/Time   CHOL 204 (H) 07/23/2018 1031   TRIG 62 07/23/2018 1031   HDL 87 07/23/2018 1031   CHOLHDL 2.3 07/23/2018 1031   CHOLHDL 2.6 07/26/2015 0001   VLDL 12 07/26/2015 0001   LDLCALC 105 (H) 07/23/2018 1031    CBC    Component Value Date/Time   WBC 10.5 07/01/2018 1444   WBC 12.4 (H) 06/10/2018 0412   RBC 4.50 07/01/2018 1444   RBC 4.63 06/10/2018 0412   HGB 14.6 07/01/2018 1444   HCT 43.9 07/01/2018 1444   PLT 267 07/01/2018 1444   MCV 98 (H) 07/01/2018 1444   MCH 32.4 07/01/2018 1444   MCH 31.7 06/10/2018 0412   MCHC 33.3 07/01/2018 1444   MCHC 32.8 06/10/2018 0412   RDW 13.3 07/01/2018 1444   LYMPHSABS 5.2 (H) 04/01/2018 2241   MONOABS 0.8 04/01/2018 2241   EOSABS 0.2 04/01/2018 2241   BASOSABS 0.0 04/01/2018 2241    ASSESSMENT AND PLAN:  1. Essential Hypertension: - amLODipine (NORVASC) 10 MG tablet; Take 1 tablet (10 mg total) by mouth daily.  Dispense: 30 tablet; Refill: 1  Take medication daily. Do not miss any doses.  Monitor blood pressure at home and keep recording of readings.  Blood pressure goal 130/80 or less  Follow DASH diet  Discussed the importance of healthy eating habits, regular aerobic exercise (at least 150 minutes a week as tolerated) and medication compliance to achieve or maintain control of diabetes. Follow a Healthy Eating Plan - You can do it! Limit sugary drinks.  Avoid sodas, sweet tea, sport or energy drinks, or fruit drinks.  Drink water, lo-fat milk, or diet drinks. Limit snack foods.   Cut back on candy, cake, cookies, chips, ice cream.  These are a special treat, only in small amounts. Eat plenty of  vegetables.  Especially dark green, red, and orange vegetables. Aim for at least 3 servings a day. More is  better! Include fruit in your daily diet.  Whole fruit is much healthier than fruit juice! Limit "white" bread, "white" pasta, "white" rice.   Choose "100% whole grain" products, brown or wild rice. Avoid fatty meats. Try "Meatless Monday" and choose eggs or beans one day a week.  When eating meat, choose lean meats like chicken, [place], and fish.  Grill, broil, or bake meats instead of frying, and eat poultry without the skin. Eat less salt.  Avoid frozen pizzas, frozen dinners and salty foods.  Use seasonings other than salt in cooking.  This can help blood pressure and keep you from swelling  Beer, wine and liquor have calories.  If you can safely drink alcohol, limit to 1 drink per day for women, 2 drinks for men  2. Vitamin D Deficiency:  - Vitamin D, Ergocalciferol, (DRISDOL) 1.25 MG (50000 UNIT) CAPS capsule; Take 1 capsule (50,000 Units total) by mouth every 7 (seven) days.  Dispense: 5 capsule; Refill: 0 -Vitamin D labs will be drawn at physical exam visit scheduled for next month.  3. Pruritus: - hydrOXYzine (VISTARIL) 25 MG capsule; Take 1 tablet by mouth every 8 hours as needed for itching.  Dispense: 30 capsule; Refill: 1  4. Nausea and vomiting, intractability of vomiting not specified, unspecified vomiting type: - ondansetron (ZOFRAN) 4 MG tablet; Take 1 tablet (4 mg total) by mouth daily.  Dispense: 7 tablet; Refill: 0 -Follow-up in 1 week with doctor if nausea and vomiting persist  5. Cervical Radicular Pain: - gabapentin (NEURONTIN) 300 MG capsule; Take 1 capsule (300 mg total) by mouth 4 (four) times daily.  Dispense: 120 capsule; Refill: 0 -Follow-up with doctor at physical examination scheduled for next month.   Patient was given the opportunity to ask questions.  Patient verbalized understanding of the plan and was able to repeat key elements of the plan.     Requested Prescriptions   Signed Prescriptions Disp Refills  . amLODipine (NORVASC) 10 MG tablet 30 tablet 1    Sig: Take 1 tablet (10 mg total) by mouth daily.  Marland Kitchen gabapentin (NEURONTIN) 300 MG capsule 120 capsule 0    Sig: Take 1 capsule (300 mg total) by mouth 4 (four) times daily.  . hydrOXYzine (VISTARIL) 25 MG capsule 30 capsule 1    Sig: Take 1 tablet by mouth every 8 hours as needed for itching.  . Vitamin D, Ergocalciferol, (DRISDOL) 1.25 MG (50000 UNIT) CAPS capsule 5 capsule 0    Sig: Take 1 capsule (50,000 Units total) by mouth every 7 (seven) days.  . ondansetron (ZOFRAN) 4 MG tablet 7 tablet 0    Sig: Take 1 tablet (4 mg total) by mouth daily.    Camillia Herter, NP

## 2019-07-04 ENCOUNTER — Other Ambulatory Visit: Payer: Self-pay | Admitting: Family

## 2019-07-04 DIAGNOSIS — E559 Vitamin D deficiency, unspecified: Secondary | ICD-10-CM

## 2019-07-18 DIAGNOSIS — Z1231 Encounter for screening mammogram for malignant neoplasm of breast: Secondary | ICD-10-CM | POA: Diagnosis not present

## 2019-07-18 DIAGNOSIS — Z6824 Body mass index (BMI) 24.0-24.9, adult: Secondary | ICD-10-CM | POA: Diagnosis not present

## 2019-07-18 DIAGNOSIS — Z01419 Encounter for gynecological examination (general) (routine) without abnormal findings: Secondary | ICD-10-CM | POA: Diagnosis not present

## 2019-08-05 ENCOUNTER — Other Ambulatory Visit: Payer: Self-pay | Admitting: Family

## 2019-08-05 DIAGNOSIS — E559 Vitamin D deficiency, unspecified: Secondary | ICD-10-CM

## 2019-09-13 DIAGNOSIS — R1084 Generalized abdominal pain: Secondary | ICD-10-CM | POA: Diagnosis not present

## 2019-09-13 DIAGNOSIS — R0602 Shortness of breath: Secondary | ICD-10-CM | POA: Diagnosis not present

## 2019-09-13 DIAGNOSIS — Z20822 Contact with and (suspected) exposure to covid-19: Secondary | ICD-10-CM | POA: Diagnosis not present

## 2019-10-17 DIAGNOSIS — H9202 Otalgia, left ear: Secondary | ICD-10-CM | POA: Diagnosis not present

## 2019-10-17 DIAGNOSIS — H65192 Other acute nonsuppurative otitis media, left ear: Secondary | ICD-10-CM | POA: Diagnosis not present

## 2019-10-17 DIAGNOSIS — H60502 Unspecified acute noninfective otitis externa, left ear: Secondary | ICD-10-CM | POA: Diagnosis not present

## 2019-11-18 ENCOUNTER — Other Ambulatory Visit: Payer: Self-pay

## 2019-11-18 ENCOUNTER — Emergency Department (HOSPITAL_COMMUNITY): Payer: BC Managed Care – PPO

## 2019-11-18 ENCOUNTER — Emergency Department (HOSPITAL_COMMUNITY)
Admission: EM | Admit: 2019-11-18 | Discharge: 2019-11-18 | Disposition: A | Discharge status: Home / Self Care | Payer: BC Managed Care – PPO | Attending: Emergency Medicine | Admitting: Emergency Medicine

## 2019-11-18 DIAGNOSIS — Y929 Unspecified place or not applicable: Secondary | ICD-10-CM | POA: Insufficient documentation

## 2019-11-18 DIAGNOSIS — W293XXA Contact with powered garden and outdoor hand tools and machinery, initial encounter: Secondary | ICD-10-CM | POA: Insufficient documentation

## 2019-11-18 DIAGNOSIS — Y93H2 Activity, gardening and landscaping: Secondary | ICD-10-CM | POA: Diagnosis not present

## 2019-11-18 DIAGNOSIS — S81011A Laceration without foreign body, right knee, initial encounter: Secondary | ICD-10-CM | POA: Insufficient documentation

## 2019-11-18 DIAGNOSIS — R52 Pain, unspecified: Secondary | ICD-10-CM | POA: Diagnosis not present

## 2019-11-18 DIAGNOSIS — Y999 Unspecified external cause status: Secondary | ICD-10-CM | POA: Diagnosis not present

## 2019-11-18 DIAGNOSIS — S81012A Laceration without foreign body, left knee, initial encounter: Secondary | ICD-10-CM | POA: Diagnosis not present

## 2019-11-18 LAB — CBC WITH DIFFERENTIAL/PLATELET
Abs Immature Granulocytes: 0.05 10*3/uL (ref 0.00–0.07)
Basophils Absolute: 0 10*3/uL (ref 0.0–0.1)
Basophils Relative: 0 %
Eosinophils Absolute: 0.1 10*3/uL (ref 0.0–0.5)
Eosinophils Relative: 1 %
HCT: 39 % (ref 36.0–46.0)
Hemoglobin: 12.9 g/dL (ref 12.0–15.0)
Immature Granulocytes: 1 %
Lymphocytes Relative: 38 %
Lymphs Abs: 3.8 10*3/uL (ref 0.7–4.0)
MCH: 32.7 pg (ref 26.0–34.0)
MCHC: 33.1 g/dL (ref 30.0–36.0)
MCV: 99 fL (ref 80.0–100.0)
Monocytes Absolute: 0.9 10*3/uL (ref 0.1–1.0)
Monocytes Relative: 9 %
Neutro Abs: 5.2 10*3/uL (ref 1.7–7.7)
Neutrophils Relative %: 51 %
Platelets: 239 10*3/uL (ref 150–400)
RBC: 3.94 MIL/uL (ref 3.87–5.11)
RDW: 14 % (ref 11.5–15.5)
WBC: 10.1 10*3/uL (ref 4.0–10.5)
nRBC: 0 % (ref 0.0–0.2)

## 2019-11-18 LAB — BASIC METABOLIC PANEL
Anion gap: 10 (ref 5–15)
BUN: 14 mg/dL (ref 6–20)
CO2: 22 mmol/L (ref 22–32)
Calcium: 8.5 mg/dL — ABNORMAL LOW (ref 8.9–10.3)
Chloride: 105 mmol/L (ref 98–111)
Creatinine, Ser: 0.95 mg/dL (ref 0.44–1.00)
GFR calc Af Amer: >60 mL/min (ref >60)
GFR calc non Af Amer: >60 mL/min (ref >60)
Glucose, Bld: 89 mg/dL (ref 70–99)
Potassium: 5.4 mmol/L — ABNORMAL HIGH (ref 3.5–5.1)
Sodium: 137 mmol/L (ref 135–145)

## 2019-11-18 MED ORDER — HYDROCODONE-ACETAMINOPHEN 5-325 MG PO TABS: 1.0000 | ORAL_TABLET | Freq: Four times a day (QID) | ORAL | 0 refills | Status: DC | PRN

## 2019-11-18 MED ORDER — TETANUS-DIPHTH-ACELL PERTUSSIS 5-2.5-18.5 LF-MCG/0.5 IM SUSP: 0.5000 mL | Freq: Once | INTRAMUSCULAR | Status: AC

## 2019-11-18 MED ORDER — HYDROMORPHONE HCL 1 MG/ML IJ SOLN
0.5000 mg | Freq: Once | INTRAMUSCULAR | Status: AC
  Administered 2019-11-18: 0.5 mg via INTRAVENOUS
  Filled 2019-11-18: qty 1

## 2019-11-18 MED ORDER — LIDOCAINE-EPINEPHRINE (PF) 2 %-1:200000 IJ SOLN
10.0000 mL | Freq: Once | INTRAMUSCULAR | Status: AC
  Administered 2019-11-18: 10 mL
  Filled 2019-11-18: qty 20

## 2019-11-18 MED ORDER — FENTANYL CITRATE (PF) 100 MCG/2ML IJ SOLN
INTRAMUSCULAR | Status: AC
  Filled 2019-11-18: qty 2

## 2019-11-18 MED ORDER — CEFAZOLIN SODIUM-DEXTROSE 2-4 GM/100ML-% IV SOLN
2.0000 g | Freq: Once | INTRAVENOUS | Status: AC
  Administered 2019-11-18: 2 g via INTRAVENOUS

## 2019-11-18 MED ORDER — TETANUS-DIPHTH-ACELL PERTUSSIS 5-2.5-18.5 LF-MCG/0.5 IM SUSP
INTRAMUSCULAR | Status: AC
  Administered 2019-11-18: 0.5 mL via INTRAMUSCULAR
  Filled 2019-11-18: qty 0.5

## 2019-11-18 MED ORDER — FENTANYL CITRATE (PF) 100 MCG/2ML IJ SOLN
INTRAMUSCULAR | Status: AC | PRN
  Administered 2019-11-18: 100 ug via INTRAVENOUS

## 2019-11-18 MED ORDER — HYDROMORPHONE HCL 1 MG/ML IJ SOLN
1.0000 mg | Freq: Once | INTRAMUSCULAR | Status: AC
  Administered 2019-11-18: 1 mg via INTRAVENOUS
  Filled 2019-11-18: qty 1

## 2019-11-18 MED ORDER — CEPHALEXIN 500 MG PO CAPS
500.0000 mg | ORAL_CAPSULE | Freq: Four times a day (QID) | ORAL | 0 refills | Status: DC
Start: 2019-11-18 — End: 2019-12-06

## 2019-11-18 NOTE — ED Notes (Signed)
Paged ortho 

## 2019-11-18 NOTE — ED Triage Notes (Signed)
Pt arrives via EMS from home with c/o of laceration over right knee. Pt was cutting wood when the chainsaw came back and hit her leg. Bleeding controlled  130mcg fentenyl 100/90 and gave 34mL NS with increase 114/90

## 2019-11-18 NOTE — Progress Notes (Signed)
Orthopedic Tech Progress Note Patient Details:  [name] November 21, 1965 [id number] Level 2 trauma Patient ID: [name], female   DOB: Sep 01, 1965, 54 y.o.   MRN: [id number]   Janit Pagan 11/18/2019, 12:55 PM

## 2019-11-18 NOTE — ED Provider Notes (Signed)
[facility] DEPART[facility] Provider Note   CSN: [id number] Arrival date & time: 11/18/19  1245     History No chief complaint on file.   [name] is a 54 y.o. female.  The history is provided by the patient. No language interpreter was used.   [name] is a 54 y.o. female who presents to the Emergency Department complaining of knee injury. She presents the emergency department as a level II trauma alert following striking her knee with a chainsaw. She states that she was cutting a tree down for her mother when the chainsaw bounced back and struck her in the right knee. She did have one beer prior to this happening. She complains of pain to her right knee, no additional injuries.    No past medical history on file.  There are no problems to display for this patient.      OB History   No obstetric history on file.     No family history on file.  Social History   Tobacco Use  . Smoking status: Not on file  Substance Use Topics  . Alcohol use: Not on file  . Drug use: Not on file    Home Medications Prior to Admission medications   Medication Sig Start Date End Date Taking? Authorizing Provider  albuterol (PROVENTIL HFA) 108 (90 Base) MCG/ACT inhaler Inhale 2 puffs into the lungs every 6 (six) hours as needed for wheezing or shortness of breath.   Yes [provider]  amLODipine (NORVASC) 10 MG tablet Take 10 mg by mouth daily. 10/30/19  Yes [provider]  gabapentin (NEURONTIN) 300 MG capsule Take 300 mg by mouth 3 (three) times daily. 06/08/19  Yes [provider]  hydrOXYzine (VISTARIL) 25 MG capsule Take 25 mg by mouth 3 (three) times daily as needed for itching.  06/07/19  Yes [provider]  ondansetron (ZOFRAN) 4 MG tablet Take 4 mg by mouth every 8 (eight) hours as needed for nausea or vomiting.  09/13/19  Yes [provider]  progesterone (PROMETRIUM) 100 MG capsule Take 100 mg by mouth  at bedtime. 08/14/19  Yes [provider]  Vitamin D, Ergocalciferol, (DRISDOL) 1.25 MG (50000 UNIT) CAPS capsule Take 50,000 Units by mouth every Friday. 07/04/19  Yes [provider]  zolpidem (AMBIEN) 10 MG tablet Take 10 mg by mouth at bedtime as needed for sleep.  07/18/19  Yes [provider]    Allergies    Patient has no known allergies.  Review of Systems   Review of Systems  All other systems reviewed and are negative.   Physical Exam Updated Vital Signs BP 128/87 (BP Location: Right Arm)   Pulse 61   Temp 99.7 F (37.6 C) (Oral)   Resp 17   Ht 5\' 6"  (1.676 m)   Wt 72.6 kg   SpO2 98%   BMI 25.82 kg/m   Physical Exam Vitals and nursing note reviewed.  Constitutional:      Appearance: She is well-developed.  HENT:     Head: Normocephalic and atraumatic.  Cardiovascular:     Rate and Rhythm: Normal rate and regular rhythm.     Heart sounds: No murmur heard.   Pulmonary:     Effort: Pulmonary effort is normal. No respiratory distress.     Breath sounds: Normal breath sounds.  Abdominal:     Palpations: Abdomen is soft.     Tenderness: There is no abdominal tenderness. There is  no guarding or rebound.  Musculoskeletal:     Comments: 2+ DP pulses. There is a large and deep laceration to the right anterior knee. Flexion extension is intact at the knee. There is local tenderness to palpation to the knee.  Skin:    General: Skin is warm and dry.  Neurological:     Mental Status: She is alert and oriented to person, place, and time.  Psychiatric:        Behavior: Behavior normal.       ED Results / Procedures / Treatments   Labs (all labs ordered are listed, but only abnormal results are displayed) Labs Reviewed  BASIC METABOLIC PANEL - Abnormal; Notable for the following components:      Result Value   Potassium 5.4 (*)    Calcium 8.5 (*)    All other components within normal limits  CBC WITH DIFFERENTIAL/PLATELET     EKG None  Radiology DG Knee Right Port  Result Date: 11/18/2019 CLINICAL DATA:  Chainsaw laxity with anterior knee laceration, initial encounter EXAM: PORTABLE RIGHT KNEE - 1-2 VIEW COMPARISON:  None. FINDINGS: Soft tissue defect is noted over the patella consistent with the given clinical history. No definitive radiopaque foreign body is noted. No definitive bony fragmentation is seen. IMPRESSION: Soft tissue injury without definitive bony abnormality or foreign body. Electronically Signed   By: Inez Catalina M.D.   On: 11/18/2019 13:26    Procedures Procedures (including critical care time)  Medications Ordered in ED Medications  fentaNYL (SUBLIMAZE) injection ( Intravenous Canceled Entry 11/18/19 1300)  Tdap (BOOSTRIX) injection 0.5 mL (0.5 mLs Intramuscular Given 11/18/19 1305)  ceFAZolin (ANCEF) IVPB 2g/100 mL premix (0 g Intravenous Stopped 11/18/19 1353)  HYDROmorphone (DILAUDID) injection 0.5 mg (0.5 mg Intravenous Given 11/18/19 1353)  HYDROmorphone (DILAUDID) injection 1 mg (1 mg Intravenous Given 11/18/19 1528)    ED Course  I have reviewed the triage vital signs and the nursing notes.  Pertinent labs & imaging results that were available during my care of the patient were reviewed by me and considered in my medical decision making (see chart for details).    MDM Rules/Calculators/A&P                         Patient presented as a level II trauma alert following chainsaw injury to the right knee. She has a deep laceration to the knee with flexion extension preserved. Orthopedics consulted due to concern for deep injury to the knee.  Patient care transferred pending ortho eval.   Final Clinical Impression(s) / ED Diagnoses Final diagnoses:  None    Rx / DC Orders ED Discharge Orders    None       Quintella Reichert, MD 11/18/19 1554

## 2019-11-18 NOTE — Discharge Instructions (Addendum)
Use the knee immobilizer to help stop using the knee.  Follow-up with Dr. Alma Friendly in about a week.  Return for signs of infection.

## 2019-11-18 NOTE — ED Notes (Signed)
CALLED DR.HUNG PER DR.FLOYD

## 2019-11-18 NOTE — Progress Notes (Signed)
Orthopedic Tech Progress Note Patient Details:  [name] [birth year] [id number]  Ortho Devices Type of Ortho Device: Knee Immobilizer Ortho Device/Splint Location: Right lower extremity Ortho Device/Splint Interventions: Ordered, Application, Adjustment   Post Interventions Patient Tolerated: Well Instructions Provided: Adjustment of device, Care of device, Poper ambulation with device   [name] [name] 11/18/2019, 5:10 PM

## 2019-11-18 NOTE — ED Provider Notes (Signed)
  Physical Exam  BP (!) 130/101   Pulse 73   Temp 99.7 F (37.6 C) (Oral)   Resp (!) 21   Ht 5\' 6"  (1.676 m)   Wt 72.6 kg   SpO2 96%   BMI 25.82 kg/m   Physical Exam  ED Course/Procedures     .Marland KitchenLaceration Repair  Date/Time: 11/18/2019 4:43 PM Performed by: Davonna Belling, MD Authorized by: Davonna Belling, MD   Consent:    Consent obtained:  Verbal   Consent given by:  Patient   Risks discussed:  Infection, need for additional repair, nerve damage, vascular damage, poor wound healing, poor cosmetic result, pain, retained foreign body and tendon damage   Alternatives discussed:  No treatment and delayed treatment Anesthesia (see MAR for exact dosages):    Anesthesia method:  Local infiltration   Local anesthetic:  Lidocaine 1% WITH epi Laceration details:    Location:  Leg   Leg location:  R knee   Length (cm):  7.5   Depth (mm):  15 Repair type:    Repair type:  Intermediate Pre-procedure details:    Preparation:  Patient was prepped and draped in usual sterile fashion and imaging obtained to evaluate for foreign bodies Exploration:    Hemostasis achieved with:  Epinephrine   Wound exploration: wound explored through full range of motion and entire depth of wound probed and visualized     Wound extent: tendon damage     Tendon damage location:  Lower extremity   Lower extremity tendon damage location:  Patella   Contaminated: no   Treatment:    Area cleansed with:  Saline   Amount of cleaning:  Standard   Irrigation solution:  Sterile saline   Irrigation volume:  1 L Subcutaneous repair:    Suture size:  4-0   Suture material:  Vicryl   Suture technique:  Simple interrupted   Number of sutures:  6 Skin repair:    Repair method:  Sutures   Suture size:  4-0   Suture material:  Prolene   Suture technique:  Running   Number of sutures:  14 Approximation:    Approximation:  Close Post-procedure details:    Dressing:  Sterile dressing   Patient  tolerance of procedure:  Tolerated well, no immediate complications    MDM   Received patient in signout.  Laceration repair of knee.  Has been seen by orthopedic surgery and determined that the joint capsule was not involved. There is a deficit of the tendon/fibrous cover over the patella.  This could not approximate and the subcu tissue was closed with Vicryl and the skin with Prolene running.  Follow-up with orthopedic surgeon in about a week.  We will give antibiotics and pain medicine.  Knee immobilizer since the laceration is right over the knee.      Davonna Belling, MD 11/18/19 [phone]

## 2019-11-18 NOTE — Progress Notes (Signed)
Responded to level 2 page to support patient who has chainsaw lac to leg.  Pt. Alert and talking with staff.  Provided emotional support to patient and support as needed to staff.  Chaplain available as needed.   Jaclynn Major, [place], [facility], Pager [phone]

## 2019-11-18 NOTE — Consult Note (Signed)
Reason for Consult:Right knee lac Referring Physician: Aaron Hudson is an 54 y.o. female.  HPI: [name] was cutting down a tree today when her chainsaw kicked back and lacerated her right knee. She had immediate pain and a deep wound and was brought to the ED as a level 2 trauma activation. She c/o localized pain to the area.  No past medical history on file.  No family history on file.  Social History:  has no history on file for tobacco use, alcohol use, and drug use.  Allergies: Not on File  Medications: I have reviewed the patient's current medications.  Results for orders placed or performed during the hospital encounter of 11/18/19 (from the past 48 hour(s))  Basic metabolic panel     Status: Abnormal   Collection Time: 11/18/19  1:00 PM  Result Value Ref Range   Sodium 137 135 - 145 mmol/L   Potassium 5.4 (H) 3.5 - 5.1 mmol/L   Chloride 105 98 - 111 mmol/L   CO2 22 22 - 32 mmol/L   Glucose, Bld 89 70 - 99 mg/dL    Comment: Glucose reference range applies only to samples taken after fasting for at least 8 hours.   BUN 14 6 - 20 mg/dL   Creatinine, Ser 0.95 0.44 - 1.00 mg/dL   Calcium 8.5 (L) 8.9 - 10.3 mg/dL   GFR calc non Af Amer >60 >60 mL/min   GFR calc Af Amer >60 >60 mL/min   Anion gap 10 5 - 15    Comment: Performed at [facility] [address], [place], [place] [address]  CBC with Differential     Status: None   Collection Time: 11/18/19  1:00 PM  Result Value Ref Range   WBC 10.1 4.0 - 10.5 K/uL   RBC 3.94 3.87 - 5.11 MIL/uL   Hemoglobin 12.9 12.0 - 15.0 g/dL   HCT 39.0 36 - 46 %   MCV 99.0 80.0 - 100.0 fL   MCH 32.7 26.0 - 34.0 pg   MCHC 33.1 30.0 - 36.0 g/dL   RDW 14.0 11.5 - 15.5 %   Platelets 239 150 - 400 K/uL   nRBC 0.0 0.0 - 0.2 %   Neutrophils Relative % 51 %   Neutro Abs 5.2 1.7 - 7.7 K/uL   Lymphocytes Relative 38 %   Lymphs Abs 3.8 0.7 - 4.0 K/uL   Monocytes Relative 9 %   Monocytes Absolute 0.9 0 - 1 K/uL   Eosinophils  Relative 1 %   Eosinophils Absolute 0.1 0 - 0 K/uL   Basophils Relative 0 %   Basophils Absolute 0.0 0 - 0 K/uL   Immature Granulocytes 1 %   Abs Immature Granulocytes 0.05 0.00 - 0.07 K/uL    Comment: Performed at [facility], [address], [place],  [address]    DG Knee Right Port  Result Date: 11/18/2019 CLINICAL DATA:  Chainsaw laxity with anterior knee laceration, initial encounter EXAM: PORTABLE RIGHT KNEE - 1-2 VIEW COMPARISON:  None. FINDINGS: Soft tissue defect is noted over the patella consistent with the given clinical history. No definitive radiopaque foreign body is noted. No definitive bony fragmentation is seen. IMPRESSION: Soft tissue injury without definitive bony abnormality or foreign body. Electronically Signed   By: Inez Catalina M.D.   On: 11/18/2019 13:26    Review of Systems  HENT: Negative for ear discharge, ear pain, hearing loss and tinnitus.   Eyes: Negative  for photophobia and pain.  Respiratory: Negative for cough and shortness of breath.   Cardiovascular: Negative for chest pain.  Gastrointestinal: Negative for abdominal pain, nausea and vomiting.  Genitourinary: Negative for dysuria, flank pain, frequency and urgency.  Musculoskeletal: Positive for arthralgias (Right knee). Negative for back pain, myalgias and neck pain.  Neurological: Negative for dizziness and headaches.  Hematological: Does not bruise/bleed easily.  Psychiatric/Behavioral: The patient is not nervous/anxious.    Blood pressure 104/74, pulse 61, temperature 99.7 F (37.6 C), temperature source Oral, resp. rate 14, height 5\' 6"  (1.676 m), weight 72.6 kg, SpO2 95 %. Physical Exam  Constitutional: She appears well-developed. No distress.  HENT:  Head: Normocephalic and atraumatic.  Eyes: Conjunctivae are normal. Right eye exhibits no discharge. Left eye exhibits no discharge. No scleral icterus.  Cardiovascular: Normal rate and regular rhythm.  Respiratory: Effort normal.  No respiratory distress.  Musculoskeletal:     Cervical back: Normal range of motion.     Comments: LLE Transverse laceration across knee, no ecchymosis or rash  Mod TTP, able to SLR  No knee or ankle effusion  Sens DPN, SPN, TN intact  Motor EHL, ext, flex, evers 5/5  DP 2+, PT 2+, No significant edema  Neurological: She is alert.  Skin: Skin is warm and dry. She is not diaphoretic.  Psychiatric: Her behavior is normal.    Assessment/Plan: Right knee lac -- I injected suprapatellar pouch with 41ml sterile saline and ranged knee through 90 degrees. No extravasation of fluid indicated intact capsule. [place] for EDP to primarily close. She should f/u with Dr. Veverly Fells in 1-2 weeks.     Lisette Abu, PA-C Orthopedic Surgery [phone] 11/18/2019, 2:01 PM

## 2019-11-24 DIAGNOSIS — M25561 Pain in right knee: Secondary | ICD-10-CM | POA: Diagnosis not present

## 2019-11-26 ENCOUNTER — Other Ambulatory Visit: Payer: Self-pay | Admitting: Family

## 2019-11-26 DIAGNOSIS — I10 Essential (primary) hypertension: Secondary | ICD-10-CM

## 2019-12-01 DIAGNOSIS — M25561 Pain in right knee: Secondary | ICD-10-CM | POA: Diagnosis not present

## 2019-12-02 ENCOUNTER — Other Ambulatory Visit: Payer: Self-pay | Admitting: Family Medicine

## 2019-12-05 NOTE — Patient Instructions (Signed)
Thank you for choosing Primary Care at [facility] to be your medical home!    [name] was seen by Megan Schools, DO today.   [name]'s primary care provider is Megan Myron, DO.   For the best care possible, you should try to see Megan Myron, DO whenever you come to the clinic.   We look forward to seeing you again soon!  If you have any questions about your visit today, please call us at [phone] or feel free to reach your primary care provider via [place].

## 2019-12-06 ENCOUNTER — Telehealth (INDEPENDENT_AMBULATORY_CARE_PROVIDER_SITE_OTHER): Payer: BC Managed Care – PPO | Admitting: Internal Medicine

## 2019-12-06 ENCOUNTER — Encounter: Payer: Self-pay | Admitting: Internal Medicine

## 2019-12-06 DIAGNOSIS — M5412 Radiculopathy, cervical region: Secondary | ICD-10-CM | POA: Diagnosis not present

## 2019-12-06 DIAGNOSIS — I10 Essential (primary) hypertension: Secondary | ICD-10-CM | POA: Diagnosis not present

## 2019-12-06 DIAGNOSIS — Z1211 Encounter for screening for malignant neoplasm of colon: Secondary | ICD-10-CM

## 2019-12-06 DIAGNOSIS — W293XXA Contact with powered garden and outdoor hand tools and machinery, initial encounter: Secondary | ICD-10-CM

## 2019-12-06 MED ORDER — CEPHALEXIN 500 MG PO CAPS: 500.0000 mg | ORAL_CAPSULE | Freq: Two times a day (BID) | ORAL | 0 refills | Status: DC

## 2019-12-06 MED ORDER — AMLODIPINE BESYLATE 10 MG PO TABS: 10.0000 mg | ORAL_TABLET | Freq: Every day | ORAL | 5 refills | Status: DC

## 2019-12-06 MED ORDER — GABAPENTIN 300 MG PO CAPS: 300.0000 mg | ORAL_CAPSULE | Freq: Four times a day (QID) | ORAL | 0 refills | Status: DC

## 2019-12-06 NOTE — Progress Notes (Signed)
Virtual Visit via Telephone Note  I connected with [name], on 12/06/2019 at 10:47 AM by telephone due to the COVID-19 pandemic and verified that I am speaking with the correct person using two identifiers.   Consent: I discussed the limitations, risks, security and privacy concerns of performing an evaluation and management service by telephone and the availability of in person appointments. I also discussed with the patient that there may be a patient responsible charge related to this service. The patient expressed understanding and agreed to proceed.   Location of Patient: Home   Location of Provider: Clinic    Persons participating in Telemedicine visit: Diya R Lance Heide Guile Dr. Juleen China      History of Present Illness: Patient has follow up for HTN and ED follow up.   Chronic HTN Disease Monitoring:  Home BP Monitoring - 120-130/70-80  Chest pain- no  Dyspnea- no Headache - no  Medications: Amlodipine 10 mg  Compliance- yes Lightheadedness- no  Edema- no    Patient was seen in ED on 6/25 after injury with chainsaw to right knee. She had stiches placed at that time. Has since had these removed. Has followed with the orthopedic doctors. No erythema of the surrounding skin, foul smelling/pus drainage, fevers, vomiting. She completed a course of Keflex.   She fell outside at the same time that she was hit by the saw. She has a sore on the back of her head that her mother looked like a scab to the area.   Past Medical History:  Diagnosis Date  . Adnexal mass 2006   Bilateral ovarian cystic masses- recomended GYN FU.   Marland Kitchen Alcohol abuse    Hx of, quit in 2009  . Ankle fracture    Bimalleolar sp closed reduction under floroscopy.   . Bronchial asthma   . Depression    Follows with [facility], history of voluntary admission to [facility].  History of suisidal ideation with drug od (50 pills of ibuprofen).   . History of cocaine abuse ([place])    Quit  in 2009  . Marijuana abuse    Hx of, quit in 2009  . Menorrhagia 2006   Endometiral Biopsy- DEGENERATING SECRETORY-TYPE ENDOMETRIUM  . Migraine headache   . Normocytic anemia   . Tobacco abuse   . Transaminitis    Considered to be secondary to alchol use.    No Known Allergies  Current Outpatient Medications on File Prior to Visit  Medication Sig Dispense Refill  . albuterol (PROVENTIL HFA) 108 (90 Base) MCG/ACT inhaler Inhale 2 puffs into the lungs every 6 (six) hours as needed for wheezing or shortness of breath.    Marland Kitchen amLODipine (NORVASC) 10 MG tablet TAKE ONE TABLET BY MOUTH DAILY 30 tablet 0  . cephALEXin (KEFLEX) 500 MG capsule Take 1 capsule (500 mg total) by mouth 4 (four) times daily. 40 capsule 0  . gabapentin (NEURONTIN) 300 MG capsule Take 1 capsule (300 mg total) by mouth 4 (four) times daily. 120 capsule 0  . HYDROcodone-acetaminophen (NORCO/VICODIN) 5-325 MG tablet Take 1-2 tablets by mouth every 6 (six) hours as needed. 8 tablet 0  . progesterone (PROMETRIUM) 100 MG capsule Take 100 mg by mouth at bedtime.    Marland Kitchen zolpidem (AMBIEN) 10 MG tablet Take 10 mg by mouth at bedtime as needed for sleep.      No current facility-administered medications on file prior to visit.    Observations/Objective: NAD. Speaking clearly.  Work of breathing normal.  Alert  and oriented. Mood appropriate.   Assessment and Plan: 1. Essential hypertension BP around goal with home measurements. Asymptomatic. Continue Amlodipine. Follow up in 3 months.  - amLODipine (NORVASC) 10 MG tablet; Take 1 tablet (10 mg total) by mouth daily.  Dispense: 30 tablet; Refill: 5  2. Contact with chainsaw as cause of accidental injury Lacerations seem to be healing well. Stitches have been removed and patient following with orthopedics. She is very concerned about the injury to her scalp from the fall. Requesting further antibiotics. Discussed that initial antibiotics likely covered for any type of infection  and that she does not have symptoms concerning for an infection in that area. However she is very nervous, and to avoid her having to wait for office visit or to utilize urgent care for this concern, will prescribe repeat short course of Keflex. Discussed potential for antibiotic resistance and side effects of antibiotics and she accepted risks.  - cephALEXin (KEFLEX) 500 MG capsule; Take 1 capsule (500 mg total) by mouth 2 (two) times daily.  Dispense: 10 capsule; Refill: 0  3. Colon cancer screening - Ambulatory referral to Gastroenterology  4. Cervical radicular pain - gabapentin (NEURONTIN) 300 MG capsule; Take 1 capsule (300 mg total) by mouth 4 (four) times daily.  Dispense: 120 capsule; Refill: 0    Follow Up Instructions: 3 month f/u for HTN    I discussed the assessment and treatment plan with the patient. The patient was provided an opportunity to ask questions and all were answered. The patient agreed with the plan and demonstrated an understanding of the instructions.   The patient was advised to call back or seek an in-person evaluation if the symptoms worsen or if the condition fails to improve as anticipated.     I provided 14 minutes total of non-face-to-face time during this encounter including median intraservice time, reviewing previous notes, investigations, ordering medications, medical decision making, coordinating care and patient verbalized understanding at the end of the visit.    Phill Myron, D.O. Primary Care at [facility]  12/06/2019, 10:47 AM

## 2019-12-07 ENCOUNTER — Ambulatory Visit: Payer: BC Managed Care – PPO | Admitting: Internal Medicine

## 2020-02-29 ENCOUNTER — Ambulatory Visit: Payer: BC Managed Care – PPO | Admitting: Internal Medicine

## 2020-03-13 ENCOUNTER — Ambulatory Visit (INDEPENDENT_AMBULATORY_CARE_PROVIDER_SITE_OTHER): Payer: BC Managed Care – PPO | Admitting: Internal Medicine

## 2020-03-13 ENCOUNTER — Encounter: Payer: Self-pay | Admitting: Internal Medicine

## 2020-03-13 ENCOUNTER — Other Ambulatory Visit: Payer: Self-pay

## 2020-03-13 VITALS — BP 99/68 | HR 77 | Temp 97.5°F | Resp 17 | Wt 146.0 lb

## 2020-03-13 DIAGNOSIS — E559 Vitamin D deficiency, unspecified: Secondary | ICD-10-CM

## 2020-03-13 DIAGNOSIS — M25561 Pain in right knee: Secondary | ICD-10-CM | POA: Diagnosis not present

## 2020-03-13 DIAGNOSIS — R7303 Prediabetes: Secondary | ICD-10-CM

## 2020-03-13 DIAGNOSIS — I10 Essential (primary) hypertension: Secondary | ICD-10-CM

## 2020-03-13 MED ORDER — MELOXICAM 15 MG PO TABS: 15.0000 mg | ORAL_TABLET | Freq: Every day | ORAL | 0 refills | Status: DC

## 2020-03-13 NOTE — Progress Notes (Addendum)
  Subjective:    [name] - 54 y.o. female MRN [id number]  Date of birth: [birth year]  HPI  [name] is here for follow up of chronic medical conditions.  Chronic HTN Disease Monitoring:  Home BP Monitoring - Yes occasionally 100s/70s  Chest pain- no  Dyspnea- no Headache - no  Medications: Amlodipine 10 mg  Compliance- yes Lightheadedness- no  Edema- no       Health Maintenance:  Health Maintenance Due  Topic Date Due  . COLONOSCOPY  Never done    -  reports that she has been smoking cigarettes. She has a 5.00 pack-year smoking history. She has never used smokeless tobacco. - Review of Systems: Per HPI. - Past Medical History: Patient Active Problem List   Diagnosis Date Noted  . Syncope 06/10/2018  . Acute neck pain 06/10/2018  . Screen for STD (sexually transmitted disease) 05/07/2016  . Encounter for health maintenance examination in adult 07/26/2015  . History of migraine headaches 07/26/2015  . Tobacco use 07/26/2015  . Vitamin D deficiency 07/26/2015  . Family history of colon cancer 07/26/2015  . History of substance abuse ([place]) 07/26/2015   - Medications: reviewed and updated   Objective:   Physical Exam BP 99/68   Pulse 77   Temp (!) 97.5 F (36.4 C) (Temporal)   Resp 17   Wt 146 lb (66.2 kg)   LMP 12/24/2012   SpO2 96%   BMI 23.57 kg/m  Physical Exam Constitutional:      General: She is not in acute distress.    Appearance: She is not diaphoretic.  HENT:     Head: Normocephalic and atraumatic.  Eyes:     Conjunctiva/sclera: Conjunctivae normal.  Cardiovascular:     Rate and Rhythm: Normal rate and regular rhythm.     Heart sounds: Normal heart sounds. No murmur heard.   Pulmonary:     Effort: Pulmonary effort is normal. No respiratory distress.     Breath sounds: Normal breath sounds.  Musculoskeletal:        General: Normal range of motion.  Skin:    General: Skin is warm and dry.     Comments: Keloid  scar across right patella without erythema or edema. TTP.   Neurological:     Mental Status: She is alert and oriented to person, place, and time.  Psychiatric:        Mood and Affect: Affect normal.        Judgment: Judgment normal.            Assessment & Plan:   1. Prediabetes a1c 6.0 in June 2020. Monitor.  - Hemoglobin A1c  2. Essential hypertension BP at goal, on lower side. Denies symptoms of hypotension. Continue current regimen. Discussed symptoms that would warrant dose reduction.   3. Vitamin D deficiency History of deficiency with result of 25 in Feb 2020. Not currently taking supplement. Monitor to guide therapy.  - Vitamin D, 25-hydroxy  4. Arthralgia of right lower leg Related to chainsaw injury to right leg this summer. Has no signs of infection or poor wound healing. Asks for pain medication. Will prescribe NSAID and encouraged other modalities of pain control. Noted that has history of polysubstance abuse.  - meloxicam (MOBIC) 15 MG tablet; Take 1 tablet (15 mg total) by mouth daily.  Dispense: 30 tablet; Refill: 0    Phill Myron, D.O. 03/13/2020, 2:59 PM Primary Care at [facility]

## 2020-03-14 LAB — VITAMIN D 25 HYDROXY (VIT D DEFICIENCY, FRACTURES): Vit D, 25-Hydroxy: 32.5 ng/mL (ref 30.0–100.0)

## 2020-03-14 LAB — HEMOGLOBIN A1C
Est. average glucose Bld gHb Est-mCnc: 120 mg/dL
Hgb A1c MFr Bld: 5.8 % — ABNORMAL HIGH (ref 4.8–5.6)

## 2020-03-21 NOTE — Progress Notes (Signed)
Patient notified of results & recommendations. Expressed understanding.

## 2020-04-18 ENCOUNTER — Other Ambulatory Visit: Payer: Self-pay

## 2020-04-18 DIAGNOSIS — M25561 Pain in right knee: Secondary | ICD-10-CM

## 2020-04-23 MED ORDER — MELOXICAM 15 MG PO TABS: 15.0000 mg | ORAL_TABLET | Freq: Every day | ORAL | 2 refills | Status: DC

## 2020-06-15 ENCOUNTER — Other Ambulatory Visit: Payer: Self-pay

## 2020-06-15 DIAGNOSIS — I10 Essential (primary) hypertension: Secondary | ICD-10-CM

## 2020-06-15 MED ORDER — AMLODIPINE BESYLATE 10 MG PO TABS: 10.0000 mg | ORAL_TABLET | Freq: Every day | ORAL | 2 refills | Status: DC

## 2020-09-12 ENCOUNTER — Ambulatory Visit: Payer: BC Managed Care – PPO | Admitting: Internal Medicine

## 2020-11-04 ENCOUNTER — Other Ambulatory Visit: Payer: Self-pay | Admitting: Internal Medicine

## 2020-11-04 DIAGNOSIS — I10 Essential (primary) hypertension: Secondary | ICD-10-CM

## 2020-11-09 ENCOUNTER — Telehealth: Payer: Self-pay

## 2020-11-09 NOTE — Telephone Encounter (Signed)
Called patient and LVM advising patient I was calling from [facility] in regards to her upcoming appointment on Monday at 8:50am. Advised patient that this visit had been changed to virtual,she does not need to come into the office. Advised patient that provider will call her at her appointment time of 8:50. Advised patient to call [phone] with any questions or concerns.

## 2020-11-12 ENCOUNTER — Ambulatory Visit: Payer: Self-pay | Attending: Nurse Practitioner | Admitting: Nurse Practitioner

## 2020-11-12 ENCOUNTER — Encounter: Payer: Self-pay | Admitting: Nurse Practitioner

## 2020-11-12 ENCOUNTER — Other Ambulatory Visit: Payer: Self-pay

## 2020-11-12 DIAGNOSIS — I10 Essential (primary) hypertension: Secondary | ICD-10-CM

## 2020-11-12 DIAGNOSIS — F17200 Nicotine dependence, unspecified, uncomplicated: Secondary | ICD-10-CM

## 2020-11-12 DIAGNOSIS — M25561 Pain in right knee: Secondary | ICD-10-CM

## 2020-11-12 MED ORDER — ALBUTEROL SULFATE HFA 108 (90 BASE) MCG/ACT IN AERS: 2.0000 | INHALATION_SPRAY | Freq: Four times a day (QID) | RESPIRATORY_TRACT | 1 refills | Status: AC | PRN

## 2020-11-12 MED ORDER — AMLODIPINE BESYLATE 10 MG PO TABS: 10.0000 mg | ORAL_TABLET | Freq: Every day | ORAL | 0 refills | Status: AC

## 2020-11-12 MED ORDER — MELOXICAM 15 MG PO TABS: 15.0000 mg | ORAL_TABLET | Freq: Every day | ORAL | 2 refills | Status: AC

## 2020-11-12 NOTE — Progress Notes (Signed)
Back pain

## 2020-11-12 NOTE — Progress Notes (Signed)
Virtual Visit via Telephone Note Due to national recommendations of social distancing due to [place] 19, telehealth visit is felt to be most appropriate for this patient at this time.  I discussed the limitations, risks, security and privacy concerns of performing an evaluation and management service by telephone and the availability of in person appointments. I also discussed with the patient that there may be a patient responsible charge related to this service. The patient expressed understanding and agreed to proceed.    I connected with [name] on 11/12/20  at   8:50 AM EDT  EDT by telephone and verified that I am speaking with the correct person using two identifiers.  Location of Patient: Private Residence   Location of Provider: [place] [facility] [employer] Office    Persons participating in Telemedicine visit: [name] FNP-BC Brenas RMA   History of Present Illness: Telemedicine visit for: Establish Care Patient has been counseled on age-appropriate routine health concerns for screening and prevention. These are reviewed and up-to-date. Referrals have been placed accordingly. Immunizations are up-to-date or declined.     Mammogram: UTD Dr. Louretta Shorten PAP smear: Hysterectomy  Colonoscopy: UTD and uninsured  Patient has been advised to apply for financial assistance and schedule to see our financial counselor.    Essential Hypertension States blood pressure is well controlled at home. Taking amlodipine 10 mg daily as prescribed. Denies chest pain, shortness of breath, palpitations, lightheadedness, dizziness, headaches or BLE edema.   BP Readings from Last 3 Encounters:  03/13/20 99/68  11/18/19 (!) 118/95  06/07/19 109/77     Multiple joint arthralgias Back and leg pain. Chronic and controlled with meloxicam.    Tobacco Dependence Not ready to quit. Uses albuterol inhaler sparingly.    Past Medical History:  Diagnosis Date    Adnexal mass 2006   Bilateral ovarian cystic masses- recomended GYN FU.    Alcohol abuse    Hx of, quit in 2009   Ankle fracture    Bimalleolar sp closed reduction under floroscopy.    Bronchial asthma    Depression    Follows with [facility], history of voluntary admission to [facility].  History of suisidal ideation with drug od (50 pills of ibuprofen).    History of cocaine abuse ([place])    Quit in 2009   Marijuana abuse    Hx of, quit in 2009   Menorrhagia 2006   Endometiral Biopsy- DEGENERATING SECRETORY-TYPE ENDOMETRIUM   Migraine headache    Normocytic anemia    Tobacco abuse    Transaminitis    Considered to be secondary to alchol use.     Past Surgical History:  Procedure Laterality Date   BARTHOLIN GLAND CYST EXCISION  2015   BILATERAL SALPINGECTOMY Bilateral 07/18/2014   Procedure: BILATERAL SALPINGECTOMY;  Surgeon: Luz Lex, MD;  Location: [facility];  Service: Gynecology;  Laterality: Bilateral;   Close reduction of bimalleolar ankle fracture     left   LAPAROSCOPIC APPENDECTOMY  07/10/2011   Procedure: APPENDECTOMY LAPAROSCOPIC;  Surgeon: Rolm Bookbinder, MD;  Location: [facility];  Service: General;  Laterality: N/A;   LAPAROSCOPY N/A 07/18/2014   Procedure: LAPAROSCOPY OPERATIVE WITH ENDOCATCH;  Surgeon: Luz Lex, MD;  Location: [facility];  Service: Gynecology;  Laterality: N/A;   LYSIS OF ADHESION N/A 07/18/2014   Procedure: LYSIS OF ADHESION;  Surgeon: Luz Lex, MD;  Location: [facility];  Service: Gynecology;  Laterality: N/A;    Family History  Problem Relation Age of Onset   Diabetes Mother    Hypertension Mother    Colon cancer Mother 2   Hypertension Father    Stroke Father    Diabetes Brother    Obesity Brother    Hypertension Brother    Heart disease Neg Hx     Social History   Socioeconomic History   Marital status: Single    Spouse name: Not on file   Number of children: Not on file   Years of education: Not on file   Highest education level: Not on  file  Occupational History   Not on file  Tobacco Use   Smoking status: Every Day    Packs/day: 0.50    Years: 10.00    Pack years: 5.00    Types: Cigarettes   Smokeless tobacco: Never  Substance and Sexual Activity   Alcohol use: No    Comment: .    Drug use: Yes    Types: Marijuana    Comment: Quit [place] and marijuana in 2009; denies   Sexual activity: Not on file  Other Topics Concern   Not on file  Social History Narrative   Lives in [place] with her mom, single, never married, no kids.  Works at [employer] with [employer] and [employer] x 6 years.  Sleeps a lot, works 3 rd shift .  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   Social Determinants of Health   Financial Resource Strain: Not on file  Food Insecurity: Not on file  Transportation Needs: Not on file  Physical Activity: Not on file  Stress: Not on file  Social Connections: Not on file     Observations/Objective: Awake, alert and oriented x 3   Review of Systems  Constitutional:  Negative for fever, malaise/fatigue and weight loss.  HENT: Negative.  Negative for nosebleeds.   Eyes: Negative.  Negative for blurred vision, double vision and photophobia.  Respiratory: Negative.  Negative for cough and shortness of breath.   Cardiovascular: Negative.  Negative for chest pain, palpitations and leg swelling.  Gastrointestinal: Negative.  Negative for heartburn, nausea and vomiting.  Musculoskeletal:  Positive for back pain, joint pain and myalgias.  Neurological: Negative.  Negative for dizziness, focal weakness, seizures and headaches.  Psychiatric/Behavioral: Negative.  Negative for suicidal ideas.    Assessment and Plan: [name] was seen today for new patient (initial visit).  Diagnoses and all orders for this visit:  Tobacco dependence -     albuterol (VENTOLIN HFA) 108 (90 Base) MCG/ACT inhaler; Inhale 2 puffs into the lungs every 6 (six) hours as needed for wheezing or shortness of breath. [name]  was counseled on the dangers of tobacco use, and was advised to quit. Reviewed strategies to maximize success, including removing cigarettes and smoking materials from environment, stress management and support of family/friends as well as pharmacological alternatives including: Wellbutrin, Chantix, Nicotine patch, Nicotine gum or lozenges. Smoking cessation support: smoking cessation hotline: 1-800-QUIT-NOW.  Smoking cessation classes are also available through [facility]. Call (825)405-3371 or visit our website at [email or web address]/.   A total of 2 minutes was spent on counseling for smoking cessation and [name] is not ready to quit.   Essential hypertension -     amLODipine (NORVASC) 10 MG tablet; Take 1 tablet (10 mg total) by mouth daily. Continue all antihypertensives as prescribed.  Remember to bring in your blood pressure log with you for your follow up appointment.  DASH/Mediterranean Diets are healthier choices for HTN.    Arthralgia of right lower leg -     meloxicam (MOBIC) 15 MG tablet; Take 1 tablet (15 mg total) by mouth daily.    Follow Up Instructions Return for HTN.     I discussed the assessment and treatment plan with the patient. The patient was provided an opportunity to ask questions and all were answered. The patient agreed with the plan and demonstrated an understanding of the instructions.   The patient was advised to call back or seek an in-person evaluation if the symptoms worsen or if the condition fails to improve as anticipated.  I provided 10 minutes of non-face-to-face time during this encounter including median intraservice time, reviewing previous notes, labs, imaging, medications and explaining diagnosis and management.  Gildardo Pounds, FNP-BC

## 2020-11-14 ENCOUNTER — Telehealth: Payer: Self-pay | Admitting: Nurse Practitioner

## 2020-11-14 NOTE — Telephone Encounter (Signed)
Called patient and left vm that she will need to get scheduled for the next available appt with Zelda for a HTN follow. She also will need to get scheduled for alab appt as well. When patient calls back please transfer to the office.

## 2020-11-14 NOTE — Telephone Encounter (Signed)
-----   Message from Gildardo Pounds, NP sent at 11/12/2020  9:11 AM EDT ----- Regarding: office appt Pls schedule labs and office visit. Thank you
